# Patient Record
Sex: Male | Born: 1935 | Race: White | Hispanic: No | Smoking: Former smoker
Health system: Southern US, Community
[De-identification: ages and names within clinical notes are randomized; demographics above are authoritative.]

## PROBLEM LIST (undated history)

## (undated) DIAGNOSIS — J449 Chronic obstructive pulmonary disease, unspecified: Secondary | ICD-10-CM

## (undated) DIAGNOSIS — I10 Essential (primary) hypertension: Secondary | ICD-10-CM

## (undated) DIAGNOSIS — H409 Unspecified glaucoma: Secondary | ICD-10-CM

## (undated) DIAGNOSIS — J439 Emphysema, unspecified: Secondary | ICD-10-CM

## (undated) HISTORY — PX: APPENDECTOMY: SHX54

## (undated) HISTORY — DX: Chronic obstructive pulmonary disease, unspecified: J44.9

## (undated) HISTORY — DX: Unspecified glaucoma: H40.9

## (undated) HISTORY — DX: Emphysema, unspecified: J43.9

---

## 2001-02-17 ENCOUNTER — Encounter: Payer: Self-pay | Admitting: Ophthalmology

## 2001-02-17 ENCOUNTER — Ambulatory Visit (HOSPITAL_COMMUNITY): Admission: RE | Admit: 2001-02-17 | Discharge: 2001-02-18 | Payer: Self-pay | Admitting: Ophthalmology

## 2003-10-20 ENCOUNTER — Ambulatory Visit (HOSPITAL_COMMUNITY): Admission: RE | Admit: 2003-10-20 | Discharge: 2003-10-21 | Payer: Self-pay | Admitting: Ophthalmology

## 2007-10-01 ENCOUNTER — Ambulatory Visit: Payer: Self-pay

## 2016-02-27 DIAGNOSIS — H409 Unspecified glaucoma: Secondary | ICD-10-CM | POA: Diagnosis not present

## 2016-03-01 DIAGNOSIS — R2689 Other abnormalities of gait and mobility: Secondary | ICD-10-CM | POA: Diagnosis not present

## 2016-03-07 DIAGNOSIS — R5381 Other malaise: Secondary | ICD-10-CM | POA: Diagnosis not present

## 2016-03-07 DIAGNOSIS — E784 Other hyperlipidemia: Secondary | ICD-10-CM | POA: Diagnosis not present

## 2016-03-07 DIAGNOSIS — I10 Essential (primary) hypertension: Secondary | ICD-10-CM | POA: Diagnosis not present

## 2016-03-10 ENCOUNTER — Encounter: Payer: Self-pay | Admitting: Emergency Medicine

## 2016-03-10 ENCOUNTER — Emergency Department
Admission: EM | Admit: 2016-03-10 | Discharge: 2016-03-10 | Disposition: A | Discharge status: Home / Self Care | Payer: Medicare Other | Attending: Emergency Medicine | Admitting: Emergency Medicine

## 2016-03-10 DIAGNOSIS — R040 Epistaxis: Secondary | ICD-10-CM | POA: Diagnosis not present

## 2016-03-10 DIAGNOSIS — Z87898 Personal history of other specified conditions: Secondary | ICD-10-CM

## 2016-03-10 DIAGNOSIS — I10 Essential (primary) hypertension: Secondary | ICD-10-CM | POA: Insufficient documentation

## 2016-03-10 HISTORY — DX: Essential (primary) hypertension: I10

## 2016-03-10 MED ORDER — PHENYLEPHRINE HCL 0.5 % NA SOLN
2.0000 [drp] | Freq: Four times a day (QID) | NASAL | Status: DC | PRN
  Filled 2016-03-10: qty 15

## 2016-03-10 NOTE — ED Provider Notes (Signed)
[facility] Emergency Department Provider Note ____________________________________________  Time seen: 1439  I have reviewed the triage vital signs and the nursing notes.  HISTORY  Chief Complaint  Epistaxis   HPI Jason Powers is a 82 y.o. male since the ED accompanied by his adult daughter for evaluation of intermittent nosebleeds since Thursday of last week. The patient does admit to having a recent upper respiratory infection as well as some ongoing and some intermittent seasonal allergic rhinitis. His most recent episode of nosebleed occurred at about 2 AM this morning, which awoke him from sleep. He describes an anterior nosebleed coming from the right nare that he was able to stop within about 5 minutes. He was able to go back to sleep and had no problems until about 7:30 AM, when he had another spontaneous bleed from the left nare after blowing his nose. He was able to control it stopped after eating within about 5 minutes. He presents here for evaluation of increased frequency of nosebleeds which appears to be situational at this time. He denies any nausea, vomiting, dizziness, or purulent nasal drainage.  Past Medical History  Diagnosis Date  . Hypertension    There are no active problems to display for this patient.  History reviewed. No pertinent past surgical history.  No current outpatient prescriptions on file.  Allergies Review of patient's allergies indicates no known allergies.  History reviewed. No pertinent family history.  Social History Social History  Substance Use Topics  . Smoking status: Never Smoker   . Smokeless tobacco: None  . Alcohol Use: No   Review of Systems  Constitutional: Negative for fever. Eyes: Negative for visual changes. ENT: Negative for sore throat. Nosebleeds as above. Cardiovascular: Negative for chest pain. Respiratory: Negative for shortness of breath. Gastrointestinal: Negative for abdominal pain,  vomiting and diarrhea. Neurological: Negative for headaches, focal weakness or numbness. ____________________________________________  PHYSICAL EXAM:  VITAL SIGNS: ED Triage Vitals  Enc Vitals Group     BP 03/10/16 1308 131/66 mmHg     Pulse Rate 03/10/16 1308 63     Resp 03/10/16 1308 18     Temp 03/10/16 1308 98.1 F (36.7 C)     Temp Source 03/10/16 1308 Oral     SpO2 03/10/16 1308 96 %     Weight 03/10/16 1308 159 lb (72.122 kg)     Height 03/10/16 1308 5\' 8"  (1.727 m)     Head Cir --      Peak Flow --      Pain Score 03/10/16 1304 0     Pain Loc --      Pain Edu? --      Excl. in [place]? --    Constitutional: Alert and oriented. Well appearing and in no distress. Head: Normocephalic and atraumatic.      Eyes: Conjunctivae are normal. PERRL. Normal extraocular movements      Ears: Canals clear. TMs intact bilaterally.   Nose: No congestion/rhinorrhea. Right anterior nare with local blood scab noted. No active bleeding.    Mouth/Throat: Mucous membranes are moist. Cardiovascular: Normal rate, regular rhythm.  Respiratory: Normal respiratory effort.  Musculoskeletal: Nontender with normal range of motion in all extremities.  Neurologic:  Normal gait without ataxia. Normal speech and language. No gross focal neurologic deficits are appreciated. Skin:  Skin is warm, dry and intact. No rash noted. ____________________________________________  PROCEDURES  Neo-synephrine 0.5 % bottle provided Nose clips provided ____________________________________________  INITIAL IMPRESSION / ASSESSMENT AND PLAN / ED  COURSE  Patient with an acute right anterior nosebleed currently control. Symptoms seemed to be aggravated secondary to seasonal allergies and recent allergic rhinitis. Patient without any history of blood thinners or coagulopathy. Could be discharged with instructions on management of acute nosebleeds. He is also provided with supplies including nose clips, gauze, and  Neo-Synephrine to use for acute nosebleed management. He will follow with primary care provider or follow up with Dr. Richardson Landry as needed for ongoing symptom management. ____________________________________________  FINAL CLINICAL IMPRESSION(S) / ED DIAGNOSES  Final diagnoses:  History of epistaxis  Right-sided epistaxis      Melvenia Needles, PA-C 03/10/16 1541  Lisa Roca, MD 03/10/16 1542

## 2016-03-10 NOTE — Discharge Instructions (Signed)
Nosebleed Nosebleeds are common. They are due to a crack in the inside lining of your nose (mucous membrane) or from a small blood vessel that starts to bleed. Nosebleeds can be caused by many conditions, such as injury, infections, dry mucous membranes or dry climate, medicines, nose picking, and home heating and cooling systems. Most nosebleeds come from blood vessels in the front of your nose. HOME CARE INSTRUCTIONS   Try controlling your nosebleed by pinching your nostrils gently and continuously for at least 10 minutes.  Avoid blowing or sniffing your nose for a number of hours after having a nosebleed.  Do not put gauze inside your nose yourself. If your nose was packed by your health care provider, try to maintain the pack inside of your nose until your health care provider removes it.  If a gauze pack was used and it starts to fall out, gently replace it or cut off the end of it.  If a balloon catheter was used to pack your nose, do not cut or remove it unless your health care provider has instructed you to do that.  Avoid lying down while you are having a nosebleed. Sit up and lean forward.  Use a nasal spray decongestant to help with a nosebleed as directed by your health care provider.  Do not use petroleum jelly or mineral oil in your nose. These can drip into your lungs.  Maintain humidity in your home by using less air conditioning or by using a humidifier.  Aspirinand blood thinners make bleeding more likely. If you are prescribed these medicines and you suffer from nosebleeds, ask your health care provider if you should stop taking the medicines or adjust the dose. Do not stop medicines unless directed by your health care provider  Resume your normal activities as you are able, but avoid straining, lifting, or bending at the waist for several days.  If your nosebleed was caused by dry mucous membranes, use over-the-counter saline nasal spray or gel. This will keep the  mucous membranes moist and allow them to heal. If you must use a lubricant, choose the water-soluble variety. Use it only sparingly, and do not use it within several hours of lying down.  Keep all follow-up visits as directed by your health care provider. This is important. SEEK MEDICAL CARE IF:  You have a fever.  You get frequent nosebleeds.  You are getting nosebleeds more often. SEEK IMMEDIATE MEDICAL CARE IF:  Your nosebleed lasts longer than 20 minutes.  Your nosebleed occurs after an injury to your face, and your nose looks crooked or broken.  You have unusual bleeding from other parts of your body.  You have unusual bruising on other parts of your body.  You feel light-headed or you faint.  You become sweaty.  You vomit blood.  Your nosebleed occurs after a head injury.   This information is not intended to replace advice given to you by your health care provider. Make sure you discuss any questions you have with your health care provider.   Document Released: 08/08/2005 Document Revised: 11/19/2014 Document Reviewed: 06/14/2014 Elsevier Interactive Patient Education 2016 [place] nose bleeds as discussed. Pack and pinch the nose and report to the ED for any nose bleeds not controlled within 45-60 minutes. See Dr. Richardson Landry or Ear, Nose, and Throat as needed.

## 2016-03-10 NOTE — ED Notes (Signed)
Pt to ed with c/o nosebleed intermittently since Thursday.  No bleeding noted at this time.

## 2016-03-12 DIAGNOSIS — R0609 Other forms of dyspnea: Secondary | ICD-10-CM | POA: Diagnosis not present

## 2016-03-12 DIAGNOSIS — R270 Ataxia, unspecified: Secondary | ICD-10-CM | POA: Diagnosis not present

## 2016-03-12 DIAGNOSIS — R040 Epistaxis: Secondary | ICD-10-CM | POA: Diagnosis not present

## 2016-08-03 DIAGNOSIS — Z23 Encounter for immunization: Secondary | ICD-10-CM | POA: Diagnosis not present

## 2016-12-27 DIAGNOSIS — H401130 Primary open-angle glaucoma, bilateral, stage unspecified: Secondary | ICD-10-CM | POA: Diagnosis not present

## 2017-04-15 DIAGNOSIS — Z1389 Encounter for screening for other disorder: Secondary | ICD-10-CM | POA: Diagnosis not present

## 2017-04-15 DIAGNOSIS — R0609 Other forms of dyspnea: Secondary | ICD-10-CM | POA: Diagnosis not present

## 2017-04-16 DIAGNOSIS — E784 Other hyperlipidemia: Secondary | ICD-10-CM | POA: Diagnosis not present

## 2017-04-16 DIAGNOSIS — I10 Essential (primary) hypertension: Secondary | ICD-10-CM | POA: Diagnosis not present

## 2017-04-16 DIAGNOSIS — R5381 Other malaise: Secondary | ICD-10-CM | POA: Diagnosis not present

## 2017-04-26 DIAGNOSIS — H401190 Primary open-angle glaucoma, unspecified eye, stage unspecified: Secondary | ICD-10-CM | POA: Diagnosis not present

## 2017-04-29 DIAGNOSIS — R0609 Other forms of dyspnea: Secondary | ICD-10-CM | POA: Diagnosis not present

## 2017-04-29 DIAGNOSIS — R040 Epistaxis: Secondary | ICD-10-CM | POA: Diagnosis not present

## 2017-08-02 DIAGNOSIS — Z23 Encounter for immunization: Secondary | ICD-10-CM | POA: Diagnosis not present

## 2017-10-28 DIAGNOSIS — R040 Epistaxis: Secondary | ICD-10-CM | POA: Diagnosis not present

## 2017-10-28 DIAGNOSIS — Z720 Tobacco use: Secondary | ICD-10-CM | POA: Diagnosis not present

## 2017-10-28 DIAGNOSIS — R0609 Other forms of dyspnea: Secondary | ICD-10-CM | POA: Diagnosis not present

## 2017-11-14 ENCOUNTER — Ambulatory Visit (INDEPENDENT_AMBULATORY_CARE_PROVIDER_SITE_OTHER): Payer: Medicare Other | Admitting: Podiatry

## 2017-11-14 ENCOUNTER — Encounter: Payer: Self-pay | Admitting: Podiatry

## 2017-11-14 DIAGNOSIS — B351 Tinea unguium: Secondary | ICD-10-CM | POA: Diagnosis not present

## 2017-11-14 DIAGNOSIS — M79674 Pain in right toe(s): Secondary | ICD-10-CM

## 2017-11-14 DIAGNOSIS — T148XXA Other injury of unspecified body region, initial encounter: Secondary | ICD-10-CM

## 2017-11-14 NOTE — Progress Notes (Signed)
   Subjective:    Patient ID: Jason Powers, male    DOB: February 26, 1921, 84 y.o.   MRN: [id number]  HPIthis patient presents the office with chief complaint of an injury to his big toenail on his right foot.  He says last night he stubbed his toenail and caused bleeding to occur under [name].  He says the injury was initially painful but he is not having much discomfort now.  He says [name] is very, very long and he is unable to self treat the Powers.  He says he had surgery for the removal of the great toenail on the left big toe.  He presents the office today for an evaluation and treatment of this painful Powers.    Review of Systems  Musculoskeletal: Positive for gait problem and myalgias.  Hematological: Bruises/bleeds easily.       Objective:   Physical Exam General Appearance  Alert, conversant and in no acute stress.  Vascular  Dorsalis pedis and posterior pulses are  Not  palpable  bilaterally.  Capillary return is within normal limits  bilaterally. Temperature is diminished  Bilaterally.  Neurologic  Senn-Weinstein monofilament wire test within normal limits  bilaterally. Muscle power within normal limits bilaterally.  Nails Thick disfigured discolored nails with subungual debris  hallux right foot.   The Powers plate is unattached from the nailbed and there is hematoma noted at the  proximal Powers fold.   Orthopedic  No limitations of motion of motion feet bilaterally.  No crepitus or effusions noted.  No bony pathology or digital deformities noted.  Skin  normotropic skin with no porokeratosis noted bilaterally.  No signs of infections or ulcers noted.          Assessment & Plan:  Subungual hematoma right hallux  Onychomycosis  Right hallux.  IE  Excision of Powers plate right hallux.  Neosporin/DSD.  Home soaking instructions given.  If this condition worsens or becomes very painful, the patient was told to contact this office or go to the Emergency Department at the hospital.  RTC prn  Gardiner Barefoot DPM

## 2018-01-08 DIAGNOSIS — R0609 Other forms of dyspnea: Secondary | ICD-10-CM | POA: Diagnosis not present

## 2018-01-08 DIAGNOSIS — R040 Epistaxis: Secondary | ICD-10-CM | POA: Diagnosis not present

## 2018-01-08 DIAGNOSIS — Z720 Tobacco use: Secondary | ICD-10-CM | POA: Diagnosis not present

## 2018-03-25 DIAGNOSIS — H353112 Nonexudative age-related macular degeneration, right eye, intermediate dry stage: Secondary | ICD-10-CM | POA: Diagnosis not present

## 2018-04-14 DIAGNOSIS — Z720 Tobacco use: Secondary | ICD-10-CM | POA: Diagnosis not present

## 2018-04-14 DIAGNOSIS — R040 Epistaxis: Secondary | ICD-10-CM | POA: Diagnosis not present

## 2018-04-14 DIAGNOSIS — R0609 Other forms of dyspnea: Secondary | ICD-10-CM | POA: Diagnosis not present

## 2018-07-29 DIAGNOSIS — C4491 Basal cell carcinoma of skin, unspecified: Secondary | ICD-10-CM | POA: Diagnosis not present

## 2018-07-29 DIAGNOSIS — R040 Epistaxis: Secondary | ICD-10-CM | POA: Diagnosis not present

## 2018-07-29 DIAGNOSIS — C4441 Basal cell carcinoma of skin of scalp and neck: Secondary | ICD-10-CM | POA: Diagnosis not present

## 2018-07-29 DIAGNOSIS — R0609 Other forms of dyspnea: Secondary | ICD-10-CM | POA: Diagnosis not present

## 2018-09-03 DIAGNOSIS — L820 Inflamed seborrheic keratosis: Secondary | ICD-10-CM | POA: Diagnosis not present

## 2018-09-03 DIAGNOSIS — L720 Epidermal cyst: Secondary | ICD-10-CM | POA: Diagnosis not present

## 2018-09-03 DIAGNOSIS — D1800 Hemangioma unspecified site: Secondary | ICD-10-CM | POA: Diagnosis not present

## 2018-09-03 DIAGNOSIS — L570 Actinic keratosis: Secondary | ICD-10-CM | POA: Diagnosis not present

## 2018-09-25 DIAGNOSIS — L720 Epidermal cyst: Secondary | ICD-10-CM | POA: Diagnosis not present

## 2018-09-25 DIAGNOSIS — L570 Actinic keratosis: Secondary | ICD-10-CM | POA: Diagnosis not present

## 2018-09-25 DIAGNOSIS — L820 Inflamed seborrheic keratosis: Secondary | ICD-10-CM | POA: Diagnosis not present

## 2018-10-01 DIAGNOSIS — H401132 Primary open-angle glaucoma, bilateral, moderate stage: Secondary | ICD-10-CM | POA: Diagnosis not present

## 2018-11-21 DIAGNOSIS — L821 Other seborrheic keratosis: Secondary | ICD-10-CM | POA: Diagnosis not present

## 2018-11-21 DIAGNOSIS — L700 Acne vulgaris: Secondary | ICD-10-CM | POA: Diagnosis not present

## 2018-11-21 DIAGNOSIS — E854 Organ-limited amyloidosis: Secondary | ICD-10-CM | POA: Diagnosis not present

## 2018-11-21 DIAGNOSIS — L570 Actinic keratosis: Secondary | ICD-10-CM | POA: Diagnosis not present

## 2018-11-21 DIAGNOSIS — D485 Neoplasm of uncertain behavior of skin: Secondary | ICD-10-CM | POA: Diagnosis not present

## 2019-04-21 DIAGNOSIS — H401132 Primary open-angle glaucoma, bilateral, moderate stage: Secondary | ICD-10-CM | POA: Diagnosis not present

## 2019-08-17 ENCOUNTER — Other Ambulatory Visit: Payer: Self-pay

## 2019-08-17 DIAGNOSIS — Z20822 Contact with and (suspected) exposure to covid-19: Secondary | ICD-10-CM

## 2019-08-18 LAB — NOVEL CORONAVIRUS, NAA: SARS-CoV-2, NAA: NOT DETECTED

## 2019-08-21 ENCOUNTER — Telehealth: Payer: Self-pay | Admitting: General Practice

## 2019-08-21 NOTE — Telephone Encounter (Signed)
Patient is calling to receive his negative COVID test results. Patient expressed understanding.

## 2019-11-17 DIAGNOSIS — M1990 Unspecified osteoarthritis, unspecified site: Secondary | ICD-10-CM | POA: Diagnosis not present

## 2019-11-17 DIAGNOSIS — Z720 Tobacco use: Secondary | ICD-10-CM | POA: Diagnosis not present

## 2019-11-17 DIAGNOSIS — J019 Acute sinusitis, unspecified: Secondary | ICD-10-CM | POA: Diagnosis not present

## 2019-12-01 ENCOUNTER — Ambulatory Visit: Payer: Medicare Other

## 2020-02-01 ENCOUNTER — Telehealth: Payer: Self-pay

## 2020-02-01 NOTE — Telephone Encounter (Signed)
Pt's daughter [name] called in and said "[facility]" knows Dr. Derrel Nip personally and would like to know if she would accept him as a new patient. I let her know she is not accepting new patients at this time but she wanted me to still ask. She would like a call back @ [phone].

## 2020-02-01 NOTE — Telephone Encounter (Signed)
Will you contact to schedule a new pt appt.

## 2020-02-01 NOTE — Telephone Encounter (Signed)
Yes, I will accept him 

## 2020-02-26 ENCOUNTER — Ambulatory Visit (INDEPENDENT_AMBULATORY_CARE_PROVIDER_SITE_OTHER): Payer: Medicare Other | Admitting: Internal Medicine

## 2020-02-26 ENCOUNTER — Encounter: Payer: Self-pay | Admitting: Internal Medicine

## 2020-02-26 ENCOUNTER — Other Ambulatory Visit: Payer: Self-pay

## 2020-02-26 VITALS — BP 138/66 | HR 67 | Temp 97.9°F | Resp 16 | Ht 68.0 in | Wt 146.9 lb

## 2020-02-26 DIAGNOSIS — R600 Localized edema: Secondary | ICD-10-CM | POA: Diagnosis not present

## 2020-02-26 DIAGNOSIS — I10 Essential (primary) hypertension: Secondary | ICD-10-CM

## 2020-02-26 DIAGNOSIS — D649 Anemia, unspecified: Secondary | ICD-10-CM

## 2020-02-26 DIAGNOSIS — R5383 Other fatigue: Secondary | ICD-10-CM

## 2020-02-26 NOTE — Progress Notes (Signed)
Subjective:  Patient ID: Jason Powers, male    DOB: June 27, 1921  Age: 86 y.o. MRN: [id number]  CC: The primary encounter diagnosis was Essential hypertension. Diagnoses of Fatigue, unspecified type and Fluid retention in legs were also pertinent to this visit.  HPI Jason Powers presents for establishment of care .  He is  A healthy 86 yr old male with no significant PMH was is transferring care from Dr Lavera Guise.  He takes HCTZ for management of ankle edema. He lives independently but his daughter accompanies him today and assists with all decisions.  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 He drives only through the neighborhood.   He reports recent onset of fatigue with activities.  Sleeps well. Denies shortness of breath or cough.  Was told he had emphysema by former PCP but has no symptoms and has not smoked in over 50 years.    This visit occurred during the SARS-CoV-2 public health emergency.  Safety protocols were in place, including screening questions prior to the visit, additional usage of staff PPE, and extensive cleaning of exam room while observing appropriate contact time as indicated for disinfecting solutions.    Patient has received both doses of the available COVID 19 vaccine without complications.  Patient continues to mask when outside of the home except when walking in yard or at safe distances from others .  Patient denies any change in mood or development of unhealthy behaviors resuting from the pandemic's restriction of activities and socialization.    History Jason Powers has a past medical history of Emphysema of lung ([place]), Glaucoma, and Hypertension.   He has no past surgical history on file.   His family history includes Cancer in his brother; Drug abuse in his brother.He reports that he quit smoking about 57 years ago. He has never used smokeless tobacco. He reports that he does not drink alcohol or use drugs.  Outpatient Medications Prior to Visit    Medication Sig Dispense Refill  . aspirin EC 81 MG tablet Take 81 mg by mouth daily.    . brinzolamide (AZOPT) 1 % ophthalmic suspension 1 drop 2 (two) times daily.    Marland Kitchen HYDROCHLOROTHIAZIDE PO Take 5 mg by mouth daily.    . Travoprost, BAK Free, (TRAVATAN) 0.004 % SOLN ophthalmic solution 1 drop at bedtime.     No facility-administered medications prior to visit.    Review of Systems:  Patient denies headache, fevers, malaise, unintentional weight loss, skin rash, eye pain, sinus congestion and sinus pain, sore throat, dysphagia,  hemoptysis , cough, dyspnea, wheezing, chest pain, palpitations, orthopnea, edema, abdominal pain, nausea, melena, diarrhea, constipation, flank pain, dysuria, hematuria, urinary  Frequency, nocturia, numbness, tingling, seizures,  Focal weakness, Loss of consciousness,  Tremor, insomnia, depression, anxiety, and suicidal ideation.     Objective:  BP 138/66 (BP Location: Left Arm, Patient Position: Sitting, Cuff Size: Normal)   Pulse 67   Temp 97.9 F (36.6 C) (Temporal)   Resp 16   Ht 5\' 8"  (1.727 m)   Wt 146 lb 14.4 oz (66.6 kg)   SpO2 96%   BMI 22.34 kg/m   Physical Exam:  General appearance: alert, cooperative and appears stated age Ears: normal TM's and external ear canals both ears Throat: lips, mucosa, and tongue normal; teeth and gums normal Neck: no adenopathy, no carotid bruit, supple, symmetrical, trachea midline and thyroid not enlarged, symmetric, no tenderness/mass/nodules Back: symmetric, no curvature. ROM normal. No CVA tenderness. Lungs: clear to  auscultation bilaterally Heart: regular rate and rhythm, S1, S2 normal, no murmur, click, rub or gallop Abdomen: soft, non-tender; bowel sounds normal; no masses,  no organomegaly Pulses: 2+ and symmetric Skin: Skin color, texture, turgor normal. No rashes or lesions Lymph nodes: Cervical, supraclavicular, and axillary nodes normal.   Assessment & Plan:   Problem List Items Addressed  This Visit      Unprioritized   Fluid retention in legs    Chronic , managed with hctz.  Lab Results  Component Value Date   NA 141 02/26/2020   K 3.9 02/26/2020   CL 102 02/26/2020   CO2 23 02/26/2020   Lab Results  Component Value Date   CREATININE 1.12 (H) 02/26/2020         Fatigue    Mild, secondary to anemia.  Will check b12, folate and iron   Lab Results  Component Value Date   WBC 6.3 02/26/2020   HGB 10.1 (L) 02/26/2020   HCT 32.7 (L) 02/26/2020   MCV 94.8 02/26/2020   PLT 258 02/26/2020   No results found for: VITAMINB12       Relevant Orders   TSH (Completed)   CBC with Differential/Platelet (Completed)    Other Visit Diagnoses    Essential hypertension    -  Primary   Relevant Orders   Comprehensive metabolic panel (Completed)      I provided  30 minutes of  face-to-face time during this encounter reviewing patient's current problems and past surgeries, labs and imaging studies, providing counseling on the above mentioned problems , and coordination  of care .  I am having Jason Powers maintain his aspirin EC, HYDROCHLOROTHIAZIDE PO, brinzolamide, and Travoprost (BAK Free).  No orders of the defined types were placed in this encounter.   There are no discontinued medications.  Follow-up: No follow-ups on file.   Crecencio Mc, MD

## 2020-02-27 DIAGNOSIS — R5383 Other fatigue: Secondary | ICD-10-CM | POA: Insufficient documentation

## 2020-02-27 DIAGNOSIS — R600 Localized edema: Secondary | ICD-10-CM | POA: Insufficient documentation

## 2020-02-27 LAB — COMPREHENSIVE METABOLIC PANEL
AG Ratio: 1.4 (calc) (ref 1.0–2.5)
ALT: 11 U/L (ref 9–46)
AST: 18 U/L (ref 10–35)
Albumin: 3.9 g/dL (ref 3.6–5.1)
Alkaline phosphatase (APISO): 49 U/L (ref 35–144)
BUN/Creatinine Ratio: 25 (calc) — ABNORMAL HIGH (ref 6–22)
BUN: 28 mg/dL — ABNORMAL HIGH (ref 7–25)
CO2: 23 mmol/L (ref 20–32)
Calcium: 8.9 mg/dL (ref 8.6–10.3)
Chloride: 102 mmol/L (ref 98–110)
Creat: 1.12 mg/dL — ABNORMAL HIGH (ref 0.70–1.11)
Globulin: 2.8 g/dL (calc) (ref 1.9–3.7)
Glucose, Bld: 109 mg/dL — ABNORMAL HIGH (ref 65–99)
Potassium: 3.9 mmol/L (ref 3.5–5.3)
Sodium: 141 mmol/L (ref 135–146)
Total Bilirubin: 0.4 mg/dL (ref 0.2–1.2)
Total Protein: 6.7 g/dL (ref 6.1–8.1)

## 2020-02-27 LAB — CBC WITH DIFFERENTIAL/PLATELET
Absolute Monocytes: 725 cells/uL (ref 200–950)
Basophils Absolute: 69 cells/uL (ref 0–200)
Basophils Relative: 1.1 %
Eosinophils Absolute: 202 cells/uL (ref 15–500)
Eosinophils Relative: 3.2 %
HCT: 32.7 % — ABNORMAL LOW (ref 38.5–50.0)
Hemoglobin: 10.1 g/dL — ABNORMAL LOW (ref 13.2–17.1)
Lymphs Abs: 1071 cells/uL (ref 850–3900)
MCH: 29.3 pg (ref 27.0–33.0)
MCHC: 30.9 g/dL — ABNORMAL LOW (ref 32.0–36.0)
MCV: 94.8 fL (ref 80.0–100.0)
MPV: 11.3 fL (ref 7.5–12.5)
Monocytes Relative: 11.5 %
Neutro Abs: 4234 cells/uL (ref 1500–7800)
Neutrophils Relative %: 67.2 %
Platelets: 258 10*3/uL (ref 140–400)
RBC: 3.45 10*6/uL — ABNORMAL LOW (ref 4.20–5.80)
RDW: 15 % (ref 11.0–15.0)
Total Lymphocyte: 17 %
WBC: 6.3 10*3/uL (ref 3.8–10.8)

## 2020-02-27 LAB — TSH: TSH: 2.39 mIU/L (ref 0.40–4.50)

## 2020-02-27 NOTE — Assessment & Plan Note (Signed)
Chronic , managed with hctz.  Lab Results  Component Value Date   NA 141 02/26/2020   K 3.9 02/26/2020   CL 102 02/26/2020   CO2 23 02/26/2020   Lab Results  Component Value Date   CREATININE 1.12 (H) 02/26/2020

## 2020-02-27 NOTE — Assessment & Plan Note (Signed)
Mild, secondary to anemia.  Will check b12, folate and iron   Lab Results  Component Value Date   WBC 6.3 02/26/2020   HGB 10.1 (L) 02/26/2020   HCT 32.7 (L) 02/26/2020   MCV 94.8 02/26/2020   PLT 258 02/26/2020   No results found for: [id number]

## 2020-02-28 NOTE — Addendum Note (Signed)
Addended by: Crecencio Mc on: 02/28/2020 05:14 PM   Modules accepted: Orders

## 2020-03-01 ENCOUNTER — Other Ambulatory Visit: Payer: Self-pay

## 2020-03-01 ENCOUNTER — Other Ambulatory Visit (INDEPENDENT_AMBULATORY_CARE_PROVIDER_SITE_OTHER): Payer: Medicare Other

## 2020-03-01 DIAGNOSIS — D649 Anemia, unspecified: Secondary | ICD-10-CM

## 2020-03-02 ENCOUNTER — Other Ambulatory Visit: Payer: Self-pay | Admitting: Internal Medicine

## 2020-03-02 ENCOUNTER — Encounter: Payer: Self-pay | Admitting: Internal Medicine

## 2020-03-02 DIAGNOSIS — D509 Iron deficiency anemia, unspecified: Secondary | ICD-10-CM | POA: Insufficient documentation

## 2020-03-02 LAB — B12 AND FOLATE PANEL
Folate: 17.4 ng/mL (ref >5.9)
Vitamin B-12: 560 pg/mL (ref 211–911)

## 2020-03-02 LAB — IRON,TIBC AND FERRITIN PANEL
%SAT: 5 % (calc) — ABNORMAL LOW (ref 20–48)
Ferritin: 14 ng/mL — ABNORMAL LOW (ref 24–380)
Iron: 20 ug/dL — ABNORMAL LOW (ref 50–180)
TIBC: 396 mcg/dL (calc) (ref 250–425)

## 2020-03-02 MED ORDER — FERROUS FUM-IRON POLYSACCH 162-115.2 MG PO CAPS: 1.0000 | ORAL_CAPSULE | Freq: Every day | ORAL | 2 refills | Status: DC

## 2020-03-02 NOTE — Progress Notes (Signed)
Jason Powers's additional labs indicate that his  iron stores are  low and this is the cause for the anemia.   If you can tolerate a daily iron supplement with food or orange juice to prevent  nausea  I would recommend taking one for 6 weeks and repeating your labs at that time . Your other labs are normal.   You may develop constipation with iron supplements;  try using a stool softener (Colace) at night.   Regards,  Dr. Derrel Nip

## 2020-03-08 ENCOUNTER — Other Ambulatory Visit (INDEPENDENT_AMBULATORY_CARE_PROVIDER_SITE_OTHER): Payer: Medicare Other

## 2020-03-08 ENCOUNTER — Other Ambulatory Visit: Payer: Self-pay | Admitting: Internal Medicine

## 2020-03-08 DIAGNOSIS — D649 Anemia, unspecified: Secondary | ICD-10-CM | POA: Diagnosis not present

## 2020-03-08 DIAGNOSIS — D508 Other iron deficiency anemias: Secondary | ICD-10-CM

## 2020-03-08 LAB — FECAL OCCULT BLOOD, IMMUNOCHEMICAL: Fecal Occult Bld: NEGATIVE

## 2020-04-21 ENCOUNTER — Other Ambulatory Visit: Payer: Self-pay

## 2020-04-21 ENCOUNTER — Other Ambulatory Visit (INDEPENDENT_AMBULATORY_CARE_PROVIDER_SITE_OTHER): Payer: Medicare Other

## 2020-04-21 DIAGNOSIS — D508 Other iron deficiency anemias: Secondary | ICD-10-CM

## 2020-04-21 LAB — CBC WITH DIFFERENTIAL/PLATELET
Basophils Absolute: 0.2 10*3/uL — ABNORMAL HIGH (ref 0.0–0.1)
Basophils Relative: 3.1 % — ABNORMAL HIGH (ref 0.0–3.0)
Eosinophils Absolute: 0.3 10*3/uL (ref 0.0–0.7)
Eosinophils Relative: 5.4 % — ABNORMAL HIGH (ref 0.0–5.0)
HCT: 35.8 % — ABNORMAL LOW (ref 39.0–52.0)
Hemoglobin: 11.9 g/dL — ABNORMAL LOW (ref 13.0–17.0)
Lymphocytes Relative: 14.9 % (ref 12.0–46.0)
Lymphs Abs: 0.8 10*3/uL (ref 0.7–4.0)
MCHC: 33.3 g/dL (ref 30.0–36.0)
MCV: 94.7 fl (ref 78.0–100.0)
Monocytes Absolute: 0.7 10*3/uL (ref 0.1–1.0)
Monocytes Relative: 13.1 % — ABNORMAL HIGH (ref 3.0–12.0)
Neutro Abs: 3.4 10*3/uL (ref 1.4–7.7)
Neutrophils Relative %: 63.5 % (ref 43.0–77.0)
Platelets: 235 10*3/uL (ref 150.0–400.0)
RBC: 3.78 Mil/uL — ABNORMAL LOW (ref 4.22–5.81)
RDW: 22.8 % — ABNORMAL HIGH (ref 11.5–15.5)
WBC: 5.4 10*3/uL (ref 4.0–10.5)

## 2020-04-21 LAB — IRON,TIBC AND FERRITIN PANEL
%SAT: 32 % (ref 20–48)
Ferritin: 83 ng/mL (ref 24–380)
Iron: 93 ug/dL (ref 50–180)
TIBC: 290 ug/dL (ref 250–425)

## 2020-05-02 DIAGNOSIS — H401132 Primary open-angle glaucoma, bilateral, moderate stage: Secondary | ICD-10-CM | POA: Diagnosis not present

## 2020-05-23 ENCOUNTER — Ambulatory Visit (INDEPENDENT_AMBULATORY_CARE_PROVIDER_SITE_OTHER): Payer: Medicare Other

## 2020-05-23 VITALS — Ht 68.0 in | Wt 146.0 lb

## 2020-05-23 DIAGNOSIS — Z0000 Encounter for general adult medical examination without abnormal findings: Secondary | ICD-10-CM

## 2020-05-23 NOTE — Progress Notes (Addendum)
Subjective:   Jason Powers is a 86 y.o. male who presents for an Initial Medicare Annual Wellness Visit.  Review of Systems    No ROS.  Medicare Wellness Virtual Visit.   Cardiac Risk Factors include: male gender;advanced age (>3men, >45 women)     Objective:    Today's Vitals   05/23/20 0838  Weight: 146 lb (66.2 kg)  Height: 5\' 8"  (1.727 m)   Body mass index is 22.2 kg/m.  Advanced Directives 05/23/2020  Does Patient Have a Medical Advance Directive? No  Would patient like information on creating a medical advance directive? Yes (MAU/Ambulatory/Procedural Areas - Information given)    Current Medications (verified) Outpatient Encounter Medications as of 05/23/2020  Medication Sig  . aspirin EC 81 MG tablet Take 81 mg by mouth daily.  . brinzolamide (AZOPT) 1 % ophthalmic suspension 1 drop 2 (two) times daily.  . ferrous fumarate-iron polysaccharide complex (TANDEM) 162-115.2 MG CAPS capsule Take 1 capsule by mouth daily with breakfast.  . HYDROCHLOROTHIAZIDE PO Take 5 mg by mouth daily.  . Travoprost, BAK Free, (TRAVATAN) 0.004 % SOLN ophthalmic solution 1 drop at bedtime.   No facility-administered encounter medications on file as of 05/23/2020.    Allergies (verified) Patient has no known allergies.   History: Past Medical History:  Diagnosis Date  . Emphysema of lung ([place])   . Glaucoma   . Hypertension    History reviewed. No pertinent surgical history. Family History  Problem Relation Age of Onset  . Drug abuse Brother   . Cancer Brother    Social History   Socioeconomic History  . Marital status: Married    Spouse name: Not on file  . Number of children: Not on file  . Years of education: Not on file  . Highest education level: Not on file  Occupational History  . Not on file  Tobacco Use  . Smoking status: Former Smoker    Quit date: 1964    Years since quitting: 57.5  . Smokeless tobacco: Never Used  Vaping Use  . Vaping Use: Never  used  Substance and Sexual Activity  . Alcohol use: No  . Drug use: No  . Sexual activity: Not Currently  Other Topics Concern  . Not on file  Social History Narrative  . Not on file   Social Determinants of Health   Financial Resource Strain: Low Risk   . Difficulty of Paying Living Expenses: Not hard at all  Food Insecurity: No Food Insecurity  . Worried About Charity fundraiser in the Last Year: Never true  . Ran Out of Food in the Last Year: Never true  Transportation Needs: No Transportation Needs  . Lack of Transportation (Medical): No  . Lack of Transportation (Non-Medical): No  Physical Activity:   . Days of Exercise per Week:   . Minutes of Exercise per Session:   Stress: No Stress Concern Present  . Feeling of Stress : Not at all  Social Connections: Unknown  . Frequency of Communication with Friends and Family: More than three times a week  . Frequency of Social Gatherings with Friends and Family: More than three times a week  . Attends Religious Services: Not on file  . Active Member of Clubs or Organizations: Not on file  . Attends Archivist Meetings: Not on file  . Marital Status: Not on file    Tobacco Counseling Counseling given: Not Answered   Clinical Intake:  Pre-visit preparation completed: Yes  Diabetes: No  How often do you need to have someone help you when you read instructions, pamphlets, or other written materials from your doctor or pharmacy?: 1 - Never  Interpreter Needed?: No      Activities of Daily Living In your present state of health, do you have any difficulty performing the following activities: 05/23/2020  Hearing? N  Vision? N  Difficulty concentrating or making decisions? N  Walking or climbing stairs? Y  Dressing or bathing? N  Doing errands, shopping? N  Preparing Food and eating ? N  Using the Toilet? N  In the past six months, have you accidently leaked urine? N  Do you have problems with  loss of bowel control? N  Managing your Medications? N  Managing your Finances? N  Housekeeping or managing your Housekeeping? N  Some recent data might be hidden    Patient Care Team: Crecencio Mc, MD as PCP - General (Internal Medicine)  Indicate any recent Medical Services you may have received from other than [facility] providers in the past year (date may be approximate).     Assessment:   This is a routine wellness examination for Jason Powers.  I connected with Jason Powers today by telephone and verified that I am speaking with the correct person using two identifiers. Location patient: home Location provider: work Persons participating in the virtual visit: patient, Marine scientist.  Daughter, [name] assist with information also.    I discussed the limitations, risks, security and privacy concerns of performing an evaluation and management service by telephone and the availability of in person appointments. The patient expressed understanding and verbally consented to this telephonic visit.    Interactive audio and video telecommunications were attempted between this provider and patient, however failed, due to patient having technical difficulties OR patient did not have access to video capability.  We continued and completed visit with audio only.  Some vital signs may be absent or patient reported.   Hearing/Vision screen  Hearing Screening   125Hz  250Hz  500Hz  1000Hz  2000Hz  3000Hz  4000Hz  6000Hz  8000Hz   Right ear:           Left ear:           Comments: Patient is able to hear conversational tones without difficulty.  No issues reported.   Vision Screening Comments: Followed by [facility], Dr. Glennon Mac Wears corrective lenses Cataract extraction, bilateral Glaucoma; drops in use Visual acuity not assessed, virtual visit.  They have seen their ophthalmologist in the last 12 months.    Dietary issues and exercise activities discussed: Regular diet Good water intake Minimal caffeine    Microwave in use when heating meals, most meals received with dine out. Prepares salad for self.   Current Exercise Habits: Home exercise routine, Type of exercise: walking;stretching, Intensity: Mild  Goals      Patient Stated   .  Maintain Healthy Lifestyle (pt-stated)      Stay active Stay hydrated Healthy diet      Depression Screen PHQ 2/9 Scores 05/23/2020 02/26/2020  PHQ - 2 Score 0 0    Fall Risk Fall Risk  05/23/2020 02/26/2020  Falls in the past year? 0 0  Number falls in past yr: 0 -  Follow up Falls evaluation completed Falls evaluation completed   Handrails in use when climbing stairs? Yes  Home free of loose throw rugs in walkways, pet beds, electrical cords, etc? Yes  Adequate lighting in your home to reduce risk of falls? Yes   ASSISTIVE DEVICES  UTILIZED TO PREVENT FALLS: Life alert? No  Use of a cane, walker or w/c? Yes , cane Grab bars in the bathroom? Yes  Shower chair or bench in shower? Yes  Elevated toilet seat or a handicapped toilet? No   TIMED UP AND GO: Was the test performed? No . Virtual visit.  Cognitive Function:  Patient is alert and oriented x3.  Denies difficulty with memory, focusing, and concentrating.     6CIT Screen 05/23/2020  What Year? 0 points   Immunizations Immunization History  Administered Date(s) Administered  . Influenza, High Dose Seasonal PF 07/28/2019  . PFIZER SARS-COV-2 Vaccination 12/26/2019, 01/23/2020   Tdap vaccine- deferred  Pneumococcal vaccine- plans to receive at next office visit.  Health Maintenance Health Maintenance  Topic Date Due  . PNA vac Low Risk Adult (1 of 2 - PCV13) Never done  . TETANUS/TDAP  05/23/2021 (Originally 04/19/1940)  . INFLUENZA VACCINE  06/12/2020  . COVID-19 Vaccine  Completed   Dental Screening: Recommended annual dental exams for proper oral hygiene  Community Resource Referral / Chronic Care Management: CRR required this visit?  No   CCM required this visit?  No      Plan:   Keep all routine maintenance appointments.   Follow up 08/29/20 @ 10:30  I have personally reviewed and noted the following in the patient's chart:   . Medical and social history . Use of alcohol, tobacco or illicit drugs  . Current medications and supplements . Functional ability and status . Nutritional status . Physical activity . Advanced directives . List of other physicians . Hospitalizations, surgeries, and ER visits in previous 12 months . Vitals . Screenings to include cognitive, depression, and falls . Referrals and appointments  In addition, I have reviewed and discussed with patient certain preventive protocols, quality metrics, and best practice recommendations. A written personalized care plan for preventive services as well as general preventive health recommendations were provided to patient via mail.     OBrien-Blaney, Sufyan Meidinger L, LPN   [id number]    I have reviewed the above information and agree with above.   Deborra Medina, MD

## 2020-05-23 NOTE — Patient Instructions (Addendum)
Jason Powers , Thank you for taking time to come for your Medicare Wellness Visit. I appreciate your ongoing commitment to your health goals. Please review the following plan we discussed and let me know if I can assist you in the future.   These are the goals we discussed: Goals      Patient Stated   .  Maintain Healthy Lifestyle (pt-stated)      Stay active Stay hydrated Healthy diet       This is a list of the screening recommended for you and due dates:  Health Maintenance  Topic Date Due  . Pneumonia vaccines (1 of 2 - PCV13) Never done  . Tetanus Vaccine  05/23/2021*  . Flu Shot  06/12/2020  . COVID-19 Vaccine  Completed  *Topic was postponed. The date shown is not the original due date.    Immunizations Immunization History  Administered Date(s) Administered  . Influenza, High Dose Seasonal PF 07/28/2019  . PFIZER SARS-COV-2 Vaccination 12/26/2019, 01/23/2020   Keep all routine maintenance appointments.   Follow up 08/29/20 @ 10:30  Advanced directives: given to patient via pick up front office.   Handicap form provided via pick up front office.   Conditions/risks identified: none new.  Follow up in one year for your annual wellness visit.   Preventive Care 33 Years and Older, Male Preventive care refers to lifestyle choices and visits with your health care provider that can promote health and wellness. What does preventive care include?  A yearly physical exam. This is also called an annual well check.  Dental exams once or twice a year.  Routine eye exams. Ask your health care provider how often you should have your eyes checked.  Personal lifestyle choices, including:  Daily care of your teeth and gums.  Regular physical activity.  Eating a healthy diet.  Avoiding tobacco and drug use.  Limiting alcohol use.  Practicing safe sex.  Taking low doses of aspirin every day.  Taking vitamin and mineral supplements as recommended by your health  care provider. What happens during an annual well check? The services and screenings done by your health care provider during your annual well check will depend on your age, overall health, lifestyle risk factors, and family history of disease. Counseling  Your health care provider may ask you questions about your:  Alcohol use.  Tobacco use.  Drug use.  Emotional well-being.  Home and relationship well-being.  Sexual activity.  Eating habits.  History of falls.  Memory and ability to understand (cognition).  Work and work Statistician. Screening  You may have the following tests or measurements:  Height, weight, and BMI.  Blood pressure.  Lipid and cholesterol levels. These may be checked every 5 years, or more frequently if you are over 42 years old.  Skin check.  Lung cancer screening. You may have this screening every year starting at age 27 if you have a 30-pack-year history of smoking and currently smoke or have quit within the past 15 years.  Fecal occult blood test (FOBT) of the stool. You may have this test every year starting at age 9.  Flexible sigmoidoscopy or colonoscopy. You may have a sigmoidoscopy every 5 years or a colonoscopy every 10 years starting at age 2.  Prostate cancer screening. Recommendations will vary depending on your family history and other risks.  Hepatitis C blood test.  Hepatitis B blood test.  Sexually transmitted disease (STD) testing.  Diabetes screening. This is done by checking your  blood sugar (glucose) after you have not eaten for a while (fasting). You may have this done every 1-3 years.  Abdominal aortic aneurysm (AAA) screening. You may need this if you are a current or former smoker.  Osteoporosis. You may be screened starting at age 78 if you are at high risk. Talk with your health care provider about your test results, treatment options, and if necessary, the need for more tests. Vaccines  Your health care  provider may recommend certain vaccines, such as:  Influenza vaccine. This is recommended every year.  Tetanus, diphtheria, and acellular pertussis (Tdap, Td) vaccine. You may need a Td booster every 10 years.  Zoster vaccine. You may need this after age 56.  Pneumococcal 13-valent conjugate (PCV13) vaccine. One dose is recommended after age 50.  Pneumococcal polysaccharide (PPSV23) vaccine. One dose is recommended after age 46. Talk to your health care provider about which screenings and vaccines you need and how often you need them. This information is not intended to replace advice given to you by your health care provider. Make sure you discuss any questions you have with your health care provider. Document Released: 11/25/2015 Document Revised: 07/18/2016 Document Reviewed: 08/30/2015 Elsevier Interactive Patient Education  2017 [place] Prevention in the Home Falls can cause injuries. They can happen to people of all ages. There are many things you can do to make your home safe and to help prevent falls. What can I do on the outside of my home?  Regularly fix the edges of walkways and driveways and fix any cracks.  Remove anything that might make you trip as you walk through a door, such as a raised step or threshold.  Trim any bushes or trees on the path to your home.  Use bright outdoor lighting.  Clear any walking paths of anything that might make someone trip, such as rocks or tools.  Regularly check to see if handrails are loose or broken. Make sure that both sides of any steps have handrails.  Any raised decks and porches should have guardrails on the edges.  Have any leaves, snow, or ice cleared regularly.  Use sand or salt on walking paths during winter.  Clean up any spills in your garage right away. This includes oil or grease spills. What can I do in the bathroom?  Use night lights.  Install grab bars by the toilet and in the tub and shower. Do  not use towel bars as grab bars.  Use non-skid mats or decals in the tub or shower.  If you need to sit down in the shower, use a plastic, non-slip stool.  Keep the floor dry. Clean up any water that spills on the floor as soon as it happens.  Remove soap buildup in the tub or shower regularly.  Attach bath mats securely with double-sided non-slip rug tape.  Do not have throw rugs and other things on the floor that can make you trip. What can I do in the bedroom?  Use night lights.  Make sure that you have a light by your bed that is easy to reach.  Do not use any sheets or blankets that are too big for your bed. They should not hang down onto the floor.  Have a firm chair that has side arms. You can use this for support while you get dressed.  Do not have throw rugs and other things on the floor that can make you trip. What can I do in  the kitchen?  Clean up any spills right away.  Avoid walking on wet floors.  Keep items that you use a lot in easy-to-reach places.  If you need to reach something above you, use a strong step stool that has a grab bar.  Keep electrical cords out of the way.  Do not use floor polish or wax that makes floors slippery. If you must use wax, use non-skid floor wax.  Do not have throw rugs and other things on the floor that can make you trip. What can I do with my stairs?  Do not leave any items on the stairs.  Make sure that there are handrails on both sides of the stairs and use them. Fix handrails that are broken or loose. Make sure that handrails are as long as the stairways.  Check any carpeting to make sure that it is firmly attached to the stairs. Fix any carpet that is loose or worn.  Avoid having throw rugs at the top or bottom of the stairs. If you do have throw rugs, attach them to the floor with carpet tape.  Make sure that you have a light switch at the top of the stairs and the bottom of the stairs. If you do not have them,  ask someone to add them for you. What else can I do to help prevent falls?  Wear shoes that:  Do not have high heels.  Have rubber bottoms.  Are comfortable and fit you well.  Are closed at the toe. Do not wear sandals.  If you use a stepladder:  Make sure that it is fully opened. Do not climb a closed stepladder.  Make sure that both sides of the stepladder are locked into place.  Ask someone to hold it for you, if possible.  Clearly mark and make sure that you can see:  Any grab bars or handrails.  First and last steps.  Where the edge of each step is.  Use tools that help you move around (mobility aids) if they are needed. These include:  Canes.  Walkers.  Scooters.  Crutches.  Turn on the lights when you go into a dark area. Replace any light bulbs as soon as they burn out.  Set up your furniture so you have a clear path. Avoid moving your furniture around.  If any of your floors are uneven, fix them.  If there are any pets around you, be aware of where they are.  Review your medicines with your doctor. Some medicines can make you feel dizzy. This can increase your chance of falling. Ask your doctor what other things that you can do to help prevent falls. This information is not intended to replace advice given to you by your health care provider. Make sure you discuss any questions you have with your health care provider. Document Released: 08/25/2009 Document Revised: 04/05/2016 Document Reviewed: 12/03/2014 Elsevier Interactive Patient Education  2017 [employer].

## 2020-07-15 ENCOUNTER — Encounter: Payer: Self-pay | Admitting: Internal Medicine

## 2020-07-15 ENCOUNTER — Other Ambulatory Visit: Payer: Self-pay

## 2020-07-15 ENCOUNTER — Ambulatory Visit (INDEPENDENT_AMBULATORY_CARE_PROVIDER_SITE_OTHER): Payer: Medicare Other

## 2020-07-15 ENCOUNTER — Ambulatory Visit (INDEPENDENT_AMBULATORY_CARE_PROVIDER_SITE_OTHER): Payer: Medicare Other | Admitting: Internal Medicine

## 2020-07-15 VITALS — BP 120/64 | HR 65 | Temp 97.8°F | Resp 15 | Ht 68.0 in | Wt 147.4 lb

## 2020-07-15 DIAGNOSIS — G8929 Other chronic pain: Secondary | ICD-10-CM | POA: Diagnosis not present

## 2020-07-15 DIAGNOSIS — R600 Localized edema: Secondary | ICD-10-CM | POA: Diagnosis not present

## 2020-07-15 DIAGNOSIS — D509 Iron deficiency anemia, unspecified: Secondary | ICD-10-CM | POA: Diagnosis not present

## 2020-07-15 DIAGNOSIS — M25561 Pain in right knee: Secondary | ICD-10-CM

## 2020-07-15 LAB — CBC WITH DIFFERENTIAL/PLATELET
Basophils Absolute: 0.1 10*3/uL (ref 0.0–0.1)
Basophils Relative: 1.7 % (ref 0.0–3.0)
Eosinophils Absolute: 0.3 10*3/uL (ref 0.0–0.7)
Eosinophils Relative: 4.8 % (ref 0.0–5.0)
HCT: 37 % — ABNORMAL LOW (ref 39.0–52.0)
Hemoglobin: 12.1 g/dL — ABNORMAL LOW (ref 13.0–17.0)
Lymphocytes Relative: 13.1 % (ref 12.0–46.0)
Lymphs Abs: 0.8 10*3/uL (ref 0.7–4.0)
MCHC: 32.8 g/dL (ref 30.0–36.0)
MCV: 103.2 fl — ABNORMAL HIGH (ref 78.0–100.0)
Monocytes Absolute: 0.6 10*3/uL (ref 0.1–1.0)
Monocytes Relative: 9.9 % (ref 3.0–12.0)
Neutro Abs: 4.2 10*3/uL (ref 1.4–7.7)
Neutrophils Relative %: 70.5 % (ref 43.0–77.0)
Platelets: 204 10*3/uL (ref 150.0–400.0)
RBC: 3.59 Mil/uL — ABNORMAL LOW (ref 4.22–5.81)
RDW: 15.3 % (ref 11.5–15.5)
WBC: 5.9 10*3/uL (ref 4.0–10.5)

## 2020-07-15 LAB — COMPREHENSIVE METABOLIC PANEL
ALT: 11 U/L (ref 0–53)
AST: 16 U/L (ref 0–37)
Albumin: 3.8 g/dL (ref 3.5–5.2)
Alkaline Phosphatase: 45 U/L (ref 39–117)
BUN: 21 mg/dL (ref 6–23)
CO2: 30 mEq/L (ref 19–32)
Calcium: 8.9 mg/dL (ref 8.4–10.5)
Chloride: 102 mEq/L (ref 96–112)
Creatinine, Ser: 1.05 mg/dL (ref 0.40–1.50)
GFR: 65.32 mL/min (ref >60.00)
Glucose, Bld: 125 mg/dL — ABNORMAL HIGH (ref 70–99)
Potassium: 3.8 mEq/L (ref 3.5–5.1)
Sodium: 140 mEq/L (ref 135–145)
Total Bilirubin: 1.1 mg/dL (ref 0.2–1.2)
Total Protein: 6.3 g/dL (ref 6.0–8.3)

## 2020-07-15 LAB — IBC + FERRITIN
Ferritin: 98.1 ng/mL (ref 22.0–322.0)
Iron: 154 ug/dL (ref 42–165)
Saturation Ratios: 47 % (ref 20.0–50.0)
Transferrin: 234 mg/dL (ref 212.0–360.0)

## 2020-07-15 NOTE — Progress Notes (Signed)
Subjective:  Patient ID: Powers Powers, male    DOB: 11/21/1920  Age: 86 y.o. MRN: [id number]  CC: The primary encounter diagnosis was Chronic pain of right knee. Diagnoses of Iron deficiency anemia, unspecified iron deficiency anemia type and Fluid retention in legs were also pertinent to this visit.  HPI Powers Powers presents for TREATMENT AND EVALUATION OF RIGHT KNEE PAIN   This visit occurred during the SARS-CoV-2 public health emergency.  Safety protocols were in place, including screening questions prior to the visit, additional usage of staff PPE, and extensive cleaning of exam room while observing appropriate contact time as indicated for disinfecting solutions.    Patient has received both doses of the available COVID 19 vaccine without complications.  Patient continues to mask when outside of the home except when walking in yard or at safe distances from others .  Patient denies any change in mood or development of unhealthy behaviors resuting from the pandemic's restriction of activities and socialization.     Patient is accompanied by his daughter today  He feels generally well for age and  denies severe or even moderate knee pain.  He has a remote history of fall onto knee.  No x rays were done at time.    States that about 2-3 weeks ago, "a  piece of bone "worked its way through the skin on  the lateral side where there is a small scab.  Daughter Powers Powers became worried .  Scab has not changed in over a week  Weight is stable,  Appetite is good. Has not been walking regularly due to the heat and due to his extreme age.  Taking the iron supplement once daily with no complications.    Outpatient Medications Prior to Visit  Medication Sig Dispense Refill  . aspirin EC 81 MG tablet Take 81 mg by mouth daily.    . brinzolamide (AZOPT) 1 % ophthalmic suspension 1 drop 2 (two) times daily.    . ferrous fumarate-iron polysaccharide complex (TANDEM) 162-115.2 MG CAPS capsule Take 1  capsule by mouth daily with breakfast. 30 capsule 2  . HYDROCHLOROTHIAZIDE PO Take 5 mg by mouth daily.    . Travoprost, BAK Free, (TRAVATAN) 0.004 % SOLN ophthalmic solution 1 drop at bedtime.     No facility-administered medications prior to visit.    Review of Systems;  Patient denies headache, fevers, malaise, unintentional weight loss, skin rash, eye pain, sinus congestion and sinus pain, sore throat, dysphagia,  hemoptysis , cough, dyspnea, wheezing, chest pain, palpitations, orthopnea, edema, abdominal pain, nausea, melena, diarrhea, constipation, flank pain, dysuria, hematuria, urinary  Frequency, nocturia, numbness, tingling, seizures,  Focal weakness, Loss of consciousness,  Tremor, insomnia, depression, anxiety, and suicidal ideation.      Objective:  BP 120/64 (BP Location: Left Arm, Patient Position: Sitting, Cuff Size: Normal)   Pulse 65   Temp 97.8 F (36.6 C) (Oral)   Resp 15   Ht 5\' 8"  (1.727 m)   Wt 147 lb 6.4 oz (66.9 kg)   SpO2 96%   BMI 22.41 kg/m   BP Readings from Last 3 Encounters:  07/15/20 120/64  02/26/20 138/66  03/10/16 (!) 139/54    Wt Readings from Last 3 Encounters:  07/15/20 147 lb 6.4 oz (66.9 kg)  05/23/20 146 lb (66.2 kg)  02/26/20 146 lb 14.4 oz (66.6 kg)    General appearance: alert, cooperative and appears stated age Ears: normal TM's and external ear canals both ears Throat: lips, mucosa, and tongue  normal; teeth and gums normal Neck: no adenopathy, no carotid bruit, supple, symmetrical, trachea midline and thyroid not enlarged, symmetric, no tenderness/mass/nodules Back: symmetric, no curvature. ROM normal. No CVA tenderness. Lungs: clear to auscultation bilaterally Heart: regular rate and rhythm, S1, S2 normal, no murmur, click, rub or gallop Abdomen: soft, non-tender; bowel sounds normal; no masses,  no organomegaly Ext:  Right lateral knee with tiny scab.  No effusions, redness or deformities,  No tenderness to  palpation Pulses: 2+ and symmetric Skin: Skin color, texture, turgor normal. No rashes or lesions Lymph nodes: Cervical, supraclavicular, and axillary nodes normal.  No results found for: HGBA1C  Lab Results  Component Value Date   CREATININE 1.05 07/15/2020   CREATININE 1.12 (H) 02/26/2020    Lab Results  Component Value Date   WBC 5.9 07/15/2020   HGB 12.1 (L) 07/15/2020   HCT 37.0 (L) 07/15/2020   PLT 204.0 07/15/2020   GLUCOSE 125 (H) 07/15/2020   ALT 11 07/15/2020   AST 16 07/15/2020   NA 140 07/15/2020   K 3.8 07/15/2020   CL 102 07/15/2020   CREATININE 1.05 07/15/2020   BUN 21 07/15/2020   CO2 30 07/15/2020   TSH 2.39 02/26/2020    No results found.  Assessment & Plan:   Problem List Items Addressed This Visit      Unprioritized   Fluid retention in legs    Chronic , managed with hctz.  electrolytes are normal.   Lab Results  Component Value Date   NA 140 07/15/2020   K 3.8 07/15/2020   CL 102 07/15/2020   CO2 30 07/15/2020   Lab Results  Component Value Date   CREATININE 1.05 07/15/2020         Iron deficiency anemia    Workup was not done for source given his extreme age .  He has been taking iron supplements with resolution of iron deficiency and improvement of anemia.  Will reduce use to weekly  Lab Results  Component Value Date   IRON 154 07/15/2020   TIBC 290 04/21/2020   FERRITIN 98.1 07/15/2020   Lab Results  Component Value Date   WBC 5.9 07/15/2020   HGB 12.1 (L) 07/15/2020   HCT 37.0 (L) 07/15/2020   MCV 103.2 (H) 07/15/2020   PLT 204.0 07/15/2020         Relevant Orders   Comprehensive metabolic panel (Completed)   IBC + Ferritin (Completed)   CBC with Differential/Platelet (Completed)   Knee pain, chronic - Primary    There is no evidence on plain films of a prior fracture of the patella or tib/fib and no bone fragments.       Relevant Orders   DG Knee Complete 4 Views Right (Completed)     I provided  30  minutes of  face-to-face time during this encounter reviewing patient's current problems and past surgeries, labs and imaging studies, providing counseling on the above mentioned problems , and coordination  of care .  I am having Powers Powers maintain his aspirin EC, HYDROCHLOROTHIAZIDE PO, brinzolamide, Travoprost (BAK Free), and ferrous fumarate-iron polysaccharide complex.  No orders of the defined types were placed in this encounter.   There are no discontinued medications.  Follow-up: No follow-ups on file.   Crecencio Mc, MD

## 2020-07-15 NOTE — Patient Instructions (Addendum)
Jason Powers,  Good to see you!  You can take up to 2000 mg of acetominophen (tylenol) every day safely  In divided doses (500 mg every 6 hours,   Or 1000 mg every 12 hours.)   xrays of knee   Watch that scab , if it doesn't go away in another month ,  Go see Dr Phillip Heal

## 2020-07-18 DIAGNOSIS — G8929 Other chronic pain: Secondary | ICD-10-CM | POA: Insufficient documentation

## 2020-07-18 NOTE — Assessment & Plan Note (Signed)
There is no evidence on plain films of a prior fracture of the patella or tib/fib and no bone fragments.

## 2020-07-18 NOTE — Assessment & Plan Note (Addendum)
Workup was not done for source given his extreme age .  He has been taking iron supplements with resolution of iron deficiency and improvement of anemia.  Will reduce use to weekly  Lab Results  Component Value Date   IRON 154 07/15/2020   TIBC 290 04/21/2020   FERRITIN 98.1 07/15/2020   Lab Results  Component Value Date   WBC 5.9 07/15/2020   HGB 12.1 (L) 07/15/2020   HCT 37.0 (L) 07/15/2020   MCV 103.2 (H) 07/15/2020   PLT 204.0 07/15/2020

## 2020-07-18 NOTE — Assessment & Plan Note (Signed)
Chronic , managed with hctz.  electrolytes are normal.   Lab Results  Component Value Date   NA 140 07/15/2020   K 3.8 07/15/2020   CL 102 07/15/2020   CO2 30 07/15/2020   Lab Results  Component Value Date   CREATININE 1.05 07/15/2020

## 2020-07-31 DIAGNOSIS — Z03818 Encounter for observation for suspected exposure to other biological agents ruled out: Secondary | ICD-10-CM | POA: Diagnosis not present

## 2020-07-31 DIAGNOSIS — U071 COVID-19: Secondary | ICD-10-CM | POA: Diagnosis not present

## 2020-08-04 ENCOUNTER — Telehealth: Payer: Self-pay | Admitting: Internal Medicine

## 2020-08-04 NOTE — Telephone Encounter (Signed)
Yes he can use sudafed PE for the congestion.  If he is not short of breath he may not need the infusion,  But based on his age alone he qualifies.  You can make the referral.

## 2020-08-04 NOTE — Telephone Encounter (Signed)
Called and spoke with [name]. [name] states that [place] had seasonal allergy like symptoms around 07/22/20. No fever or body aches. On 07/29/20 he received a covid quick test that showed positive. Jiles was taken to [facility] on 07/31/20 and received another test. Positive test results came back on 08/02/20. [name] asks if there is any medication he could take to help with nasal congestion and drainage and if he qualifies to receive the Monoclonal Infusions.

## 2020-08-04 NOTE — Telephone Encounter (Signed)
Called and spoke to [place], She verbalized understanding and had no questions. She will speak to [place] and see if he is willing to do the infusion as she states he is not experiencing any shortness of breath. She will call back and let us know if he is willing for the referral.

## 2020-08-04 NOTE — Telephone Encounter (Signed)
Patient's daughter called stated that her father had been tested for covid and he tested positive has been to long so he can not get the infusions and wanted to know if it was something else he could take for the congestion. Call daughter [phone]

## 2020-08-05 NOTE — Telephone Encounter (Signed)
Antibody infusion referral has been called in.

## 2020-08-05 NOTE — Telephone Encounter (Signed)
Pt daughter called they would like to go ahead with the Infusions

## 2020-08-05 NOTE — Telephone Encounter (Signed)
Please call daughter when referral is placed [phone]

## 2020-08-06 ENCOUNTER — Encounter: Payer: Self-pay | Admitting: Family

## 2020-08-06 NOTE — Telephone Encounter (Signed)
Called to Discuss with patient about Covid symptoms and the use of the monoclonal antibody infusion for those with mild to moderate Covid symptoms and at a high risk of hospitalization.     Pt appears to qualify for this infusion due to co-morbid conditions and/or a member of an at-risk group in accordance with the FDA Emergency Use Authorization.    Unable to reach pt. Left VM. Sent MyChart message   Loel Dubonnet, NP

## 2020-08-07 ENCOUNTER — Other Ambulatory Visit: Payer: Self-pay | Admitting: Nurse Practitioner

## 2020-08-07 ENCOUNTER — Ambulatory Visit (HOSPITAL_COMMUNITY)
Admission: RE | Admit: 2020-08-07 | Discharge: 2020-08-07 | Disposition: A | Discharge status: Home / Self Care | Payer: Medicare Other | Source: Ambulatory Visit | Attending: Pulmonary Disease | Admitting: Pulmonary Disease

## 2020-08-07 ENCOUNTER — Other Ambulatory Visit: Payer: Self-pay | Admitting: Physician Assistant

## 2020-08-07 ENCOUNTER — Encounter: Payer: Self-pay | Admitting: Nurse Practitioner

## 2020-08-07 DIAGNOSIS — Z23 Encounter for immunization: Secondary | ICD-10-CM | POA: Insufficient documentation

## 2020-08-07 DIAGNOSIS — U071 COVID-19: Secondary | ICD-10-CM | POA: Diagnosis present

## 2020-08-07 MED ORDER — EPINEPHRINE 0.3 MG/0.3ML IJ SOAJ: 0.3000 mg | Freq: Once | INTRAMUSCULAR | Status: DC | PRN

## 2020-08-07 MED ORDER — DIPHENHYDRAMINE HCL 50 MG/ML IJ SOLN: 50.0000 mg | Freq: Once | INTRAMUSCULAR | Status: DC | PRN

## 2020-08-07 MED ORDER — ALBUTEROL SULFATE HFA 108 (90 BASE) MCG/ACT IN AERS: 2.0000 | INHALATION_SPRAY | Freq: Once | RESPIRATORY_TRACT | Status: DC | PRN

## 2020-08-07 MED ORDER — FAMOTIDINE IN NACL 20-0.9 MG/50ML-% IV SOLN: 20.0000 mg | Freq: Once | INTRAVENOUS | Status: DC | PRN

## 2020-08-07 MED ORDER — SODIUM CHLORIDE 0.9 % IV SOLN: INTRAVENOUS | Status: DC | PRN

## 2020-08-07 MED ORDER — METHYLPREDNISOLONE SODIUM SUCC 125 MG IJ SOLR: 125.0000 mg | Freq: Once | INTRAMUSCULAR | Status: DC | PRN

## 2020-08-07 MED ORDER — SODIUM CHLORIDE 0.9 % IV SOLN
1200.0000 mg | Freq: Once | INTRAVENOUS | Status: AC
  Administered 2020-08-07: 1200 mg via INTRAVENOUS

## 2020-08-07 NOTE — Progress Notes (Addendum)
  Diagnosis: COVID-19  Physician: Joya Gaskins  Procedure: Covid Infusion Clinic Med: casirivimab\imdevimab infusion - Provided patient with casirivimab\imdevimab fact sheet for patients, parents and caregivers prior to infusion.  Complications: No immediate complications noted.  Discharge: Discharged home   Chyrel Masson 08/07/2020

## 2020-08-07 NOTE — Progress Notes (Signed)
I connected by phone with Jason Powers on 08/07/2020 at 7:32 AM to discuss the potential use of an new treatment for mild to moderate COVID-19 viral infection in non-hospitalized patients.  This patient is a 86 y.o. male that meets the FDA criteria for Emergency Use Authorization of casirivimab\imdevimab.  Has a (+) direct SARS-CoV-2 viral test result  Has mild or moderate COVID-19   Is ? 84 years of age and weighs ? 40 kg  Is NOT hospitalized due to COVID-19  Is NOT requiring oxygen therapy or requiring an increase in baseline oxygen flow rate due to COVID-19  Is within 10 days of symptom onset  Has at least one of the high risk factor(s) for progression to severe COVID-19 and/or hospitalization as defined in EUA.  Specific high risk criteria : Older age (>/= 84 yo) and Cardiovascular disease or hypertension  Onset 9/17. Vaccinated.  I have spoken and communicated the following to the patient or parent/caregiver:  1. FDA has authorized the emergency use of casirivimab\imdevimab for the treatment of mild to moderate COVID-19 in adults and pediatric patients with positive results of direct SARS-CoV-2 viral testing who are 81 years of age and older weighing at least 40 kg, and who are at high risk for progressing to severe COVID-19 and/or hospitalization.  2. The significant known and potential risks and benefits of casirivimab\imdevimab, and the extent to which such potential risks and benefits are unknown.  3. Information on available alternative treatments and the risks and benefits of those alternatives, including clinical trials.  4. Patients treated with casirivimab\imdevimab should continue to self-isolate and use infection control measures (e.g., wear mask, isolate, social distance, avoid sharing personal items, clean and disinfect "high touch" surfaces, and frequent handwashing) according to CDC guidelines.   5. The patient or parent/caregiver has the option to accept or  refuse casirivimab\imdevimab .  After reviewing this information with the patient, the patient has agreed to receive one of the available covid 19 monoclonal antibodies and will be provided an appropriate fact sheet prior to infusion.[name], [place], AGNP-C [phone] ([place])

## 2020-08-07 NOTE — Discharge Instructions (Signed)

## 2020-08-08 NOTE — Telephone Encounter (Signed)
Called Linda back like requested. [name] states that Jason Powers has had the infusion yesterday at [facility] and that he is doing fine and is not having any reaction.

## 2020-08-29 ENCOUNTER — Encounter: Payer: Self-pay | Admitting: Internal Medicine

## 2020-08-29 ENCOUNTER — Ambulatory Visit (INDEPENDENT_AMBULATORY_CARE_PROVIDER_SITE_OTHER): Payer: Medicare Other | Admitting: Internal Medicine

## 2020-08-29 ENCOUNTER — Other Ambulatory Visit: Payer: Self-pay

## 2020-08-29 VITALS — BP 128/60 | HR 79 | Temp 97.8°F | Resp 15 | Ht 68.0 in | Wt 146.0 lb

## 2020-08-29 DIAGNOSIS — L603 Nail dystrophy: Secondary | ICD-10-CM | POA: Diagnosis not present

## 2020-08-29 DIAGNOSIS — R600 Localized edema: Secondary | ICD-10-CM

## 2020-08-29 DIAGNOSIS — R29898 Other symptoms and signs involving the musculoskeletal system: Secondary | ICD-10-CM | POA: Diagnosis not present

## 2020-08-29 DIAGNOSIS — M62838 Other muscle spasm: Secondary | ICD-10-CM

## 2020-08-29 DIAGNOSIS — D509 Iron deficiency anemia, unspecified: Secondary | ICD-10-CM

## 2020-08-29 LAB — BASIC METABOLIC PANEL
BUN: 20 mg/dL (ref 6–23)
CO2: 30 mEq/L (ref 19–32)
Calcium: 8.9 mg/dL (ref 8.4–10.5)
Chloride: 100 mEq/L (ref 96–112)
Creatinine, Ser: 1.03 mg/dL (ref 0.40–1.50)
GFR: 60.44 mL/min (ref >60.00)
Glucose, Bld: 112 mg/dL — ABNORMAL HIGH (ref 70–99)
Potassium: 4.1 mEq/L (ref 3.5–5.1)
Sodium: 139 mEq/L (ref 135–145)

## 2020-08-29 LAB — MAGNESIUM: Magnesium: 2 mg/dL (ref 1.5–2.5)

## 2020-08-29 MED ORDER — HYDROCHLOROTHIAZIDE 25 MG PO TABS
25.0000 mg | ORAL_TABLET | Freq: Every day | ORAL | 1 refills | Status: DC
Start: 2020-08-29 — End: 2021-05-23

## 2020-08-29 NOTE — Progress Notes (Signed)
Subjective:  Patient ID: Jason Powers, male    DOB: 07/10/1921  Age: 86 y.o. MRN: [id number]  CC: The primary encounter diagnosis was Dystrophic Powers. Diagnoses of Muscle spasm, Fluid retention in legs, Iron deficiency anemia, unspecified iron deficiency anemia type, and Bilateral leg weakness were also pertinent to this visit.  HPI Tagen Milby presents for 6 month follow up on IDA,  Knee pain and fluid retention managed with HCTZ  This visit occurred during the SARS-CoV-2 public health emergency.  Safety protocols were in place, including screening questions prior to the visit, additional usage of staff PPE, and extensive cleaning of exam room while observing appropriate contact time as indicated for disinfecting solutions.   He feels generally well and is brought in today by his daughter.   Taking iron pill once  A week   Taking hctz daily for chronic LE edema   Legs fell asleep today after prolonged sitting  In the office waiting room chair.   Toenails dystrophic .  Need podiatry referral to trim  uses a cane,  Refuses to use a walker  . liives alone.  Does  not have a medic alert for falls . No recent falls   Wants flu and pneumonia vaccines      Outpatient Medications Prior to Visit  Medication Sig Dispense Refill   aspirin EC 81 MG tablet Take 81 mg by mouth daily.     brinzolamide (AZOPT) 1 % ophthalmic suspension 1 drop 2 (two) times daily.     ferrous fumarate-iron polysaccharide complex (TANDEM) 162-115.2 MG CAPS capsule Take 1 capsule by mouth daily with breakfast. 30 capsule 2   Travoprost, BAK Free, (TRAVATAN) 0.004 % SOLN ophthalmic solution 1 drop at bedtime.     hydrochlorothiazide (HYDRODIURIL) 25 MG tablet Take by mouth.     HYDROCHLOROTHIAZIDE PO Take 5 mg by mouth daily. (Patient not taking: Reported on 08/29/2020)     No facility-administered medications prior to visit.    Review of Systems;  Patient denies headache, fevers, malaise,  unintentional weight loss, skin rash, eye pain, sinus congestion and sinus pain, sore throat, dysphagia,  hemoptysis , cough, dyspnea, wheezing, chest pain, palpitations, orthopnea, edema, abdominal pain, nausea, melena, diarrhea, constipation, flank pain, dysuria, hematuria, urinary  Frequency, nocturia, numbness, tingling, seizures,  Focal weakness, Loss of consciousness,  Tremor, insomnia, depression, anxiety, and suicidal ideation.      Objective:  BP 128/60 (BP Location: Left Arm, Patient Position: Sitting, Cuff Size: Normal)    Pulse 79    Temp 97.8 F (36.6 C) (Oral)    Resp 15    Ht 5\' 8"  (1.727 m)    Wt 146 lb (66.2 kg)    SpO2 97%    BMI 22.20 kg/m   BP Readings from Last 3 Encounters:  08/29/20 128/60  08/07/20 (!) 109/50  07/15/20 120/64    Wt Readings from Last 3 Encounters:  08/29/20 146 lb (66.2 kg)  07/15/20 147 lb 6.4 oz (66.9 kg)  05/23/20 146 lb (66.2 kg)    General appearance: alert, cooperative and appears stated age Ears: normal TM's and external ear canals both ears Throat: lips, mucosa, and tongue normal; teeth and gums normal Neck: no adenopathy, no carotid bruit, supple, symmetrical, trachea midline and thyroid not enlarged, symmetric, no tenderness/mass/nodules Back: symmetric, no curvature. ROM normal. No CVA tenderness. Lungs: clear to auscultation bilaterally Heart: regular rate and rhythm, S1, S2 normal, no murmur, click, rub or gallop Abdomen: soft, non-tender; bowel sounds normal; no  masses,  no organomegaly Pulses: 2+ and symmetric Skin: Skin color, texture, turgor normal. No rashes or lesions Lymph nodes: Cervical, supraclavicular, and axillary nodes normal.  No results found for: HGBA1C  Lab Results  Component Value Date   CREATININE 1.03 08/29/2020   CREATININE 1.05 07/15/2020   CREATININE 1.12 (H) 02/26/2020    Lab Results  Component Value Date   WBC 5.9 07/15/2020   HGB 12.1 (L) 07/15/2020   HCT 37.0 (L) 07/15/2020   PLT 204.0  07/15/2020   GLUCOSE 112 (H) 08/29/2020   ALT 11 07/15/2020   AST 16 07/15/2020   NA 139 08/29/2020   K 4.1 08/29/2020   CL 100 08/29/2020   CREATININE 1.03 08/29/2020   BUN 20 08/29/2020   CO2 30 08/29/2020   TSH 2.39 02/26/2020    No results found.  Assessment & Plan:   Problem List Items Addressed This Visit      Unprioritized   Bilateral leg weakness    Secondary to aging.  encouraged to use walker , which he declines.  Advised to use a medic alert to continue independent living safely.       Fluid retention in legs    Chronic , managed with hctz 25 mg daily.  BP is normal and   electrolytes are normal.   Lab Results  Component Value Date   NA 139 08/29/2020   K 4.1 08/29/2020   CL 100 08/29/2020   CO2 30 08/29/2020   Lab Results  Component Value Date   CREATININE 1.03 08/29/2020         Iron deficiency anemia    Workup was not done for source given his extreme age .  He has been taking iron supplements with resolution of iron deficiency and improvement of anemia.  Will continue once weekly dosing unless levels drop again    Lab Results  Component Value Date   IRON 154 07/15/2020   TIBC 290 04/21/2020   FERRITIN 98.1 07/15/2020   Lab Results  Component Value Date   WBC 5.9 07/15/2020   HGB 12.1 (L) 07/15/2020   HCT 37.0 (L) 07/15/2020   MCV 103.2 (H) 07/15/2020   PLT 204.0 07/15/2020          Other Visit Diagnoses    Dystrophic Powers    -  Primary   Relevant Orders   Ambulatory referral to Podiatry   Muscle spasm       Relevant Orders   Basic metabolic panel (Completed)   Magnesium (Completed)      I have discontinued Numan Oberman "Mack"'s HYDROCHLOROTHIAZIDE PO. I have also changed his hydrochlorothiazide. Additionally, I am having him maintain his aspirin EC, brinzolamide, Travoprost (BAK Free), and ferrous fumarate-iron polysaccharide complex.  Meds ordered this encounter  Medications   hydrochlorothiazide (HYDRODIURIL) 25 MG  tablet    Sig: Take 1 tablet (25 mg total) by mouth daily.    Dispense:  90 tablet    Refill:  1    KEEP ON FILE FOR FUTURE REFILLS    Medications Discontinued During This Encounter  Medication Reason   HYDROCHLOROTHIAZIDE PO    hydrochlorothiazide (HYDRODIURIL) 25 MG tablet Reorder    Follow-up: Return in about 6 months (around 02/27/2021).   Crecencio Mc, MD

## 2020-08-29 NOTE — Patient Instructions (Signed)
You received the flu and the pneumonia vaccines today  I recommend taking 500 mg tylenol every 6 hours today for the achiness you might get   I HIGHLY RECOMMEND that you get a MEDIC ALERT of some kind to wear so you can call for hep if you fall at home and can't get up

## 2020-08-30 DIAGNOSIS — R29898 Other symptoms and signs involving the musculoskeletal system: Secondary | ICD-10-CM | POA: Insufficient documentation

## 2020-08-30 NOTE — Assessment & Plan Note (Signed)
Workup was not done for source given his extreme age .  He has been taking iron supplements with resolution of iron deficiency and improvement of anemia.  Will continue once weekly dosing unless levels drop again    Lab Results  Component Value Date   IRON 154 07/15/2020   TIBC 290 04/21/2020   FERRITIN 98.1 07/15/2020   Lab Results  Component Value Date   WBC 5.9 07/15/2020   HGB 12.1 (L) 07/15/2020   HCT 37.0 (L) 07/15/2020   MCV 103.2 (H) 07/15/2020   PLT 204.0 07/15/2020

## 2020-08-30 NOTE — Assessment & Plan Note (Signed)
Secondary to aging.  encouraged to use walker , which he declines.  Advised to use a medic alert to continue independent living safely.

## 2020-08-30 NOTE — Assessment & Plan Note (Addendum)
Chronic , managed with hctz 25 mg daily.  BP is normal and   electrolytes are normal.   Lab Results  Component Value Date   NA 139 08/29/2020   K 4.1 08/29/2020   CL 100 08/29/2020   CO2 30 08/29/2020   Lab Results  Component Value Date   CREATININE 1.03 08/29/2020

## 2020-09-07 ENCOUNTER — Ambulatory Visit: Payer: Medicare Other | Admitting: Podiatry

## 2020-09-07 ENCOUNTER — Other Ambulatory Visit: Payer: Self-pay

## 2020-09-07 ENCOUNTER — Encounter: Payer: Self-pay | Admitting: Podiatry

## 2020-09-07 DIAGNOSIS — B351 Tinea unguium: Secondary | ICD-10-CM | POA: Diagnosis not present

## 2020-09-07 DIAGNOSIS — M79676 Pain in unspecified toe(s): Secondary | ICD-10-CM | POA: Diagnosis not present

## 2020-09-07 NOTE — Progress Notes (Signed)
He presents today chief complaint of painful elongated toenails 1 through 5 bilaterally.  Objective: Pulses are palpable.  No open lesions or wounds toenails are long thick yellow dystrophic-like mycotic painful palpation as well as debridement.  Assessment: Pain in limb secondary to onychomycosis.  Plan: Debridement of toenails 1 through 5 bilateral.

## 2020-12-12 ENCOUNTER — Ambulatory Visit: Payer: Medicare Other | Admitting: Podiatry

## 2020-12-28 ENCOUNTER — Ambulatory Visit: Payer: Medicare Other | Admitting: Podiatry

## 2020-12-28 ENCOUNTER — Other Ambulatory Visit: Payer: Self-pay

## 2020-12-28 ENCOUNTER — Encounter: Payer: Self-pay | Admitting: Podiatry

## 2020-12-28 DIAGNOSIS — B351 Tinea unguium: Secondary | ICD-10-CM | POA: Diagnosis not present

## 2020-12-28 DIAGNOSIS — M79676 Pain in unspecified toe(s): Secondary | ICD-10-CM

## 2020-12-28 NOTE — Progress Notes (Signed)
Presents today chief complaint of painfully elongated toenails 1 through 5 bilaterally.  Objective: Is a long thick yellow dystrophic-like mycotic painful palpation as well as debridement.  No open lesions or wounds are noted.  Assessment: Pain in limb secondary to onychomycosis.  Plan: Debridement of toenails 1 through 5 bilaterally.

## 2021-02-27 ENCOUNTER — Ambulatory Visit: Payer: Medicare Other | Admitting: Internal Medicine

## 2021-03-21 ENCOUNTER — Ambulatory Visit (INDEPENDENT_AMBULATORY_CARE_PROVIDER_SITE_OTHER): Payer: Medicare Other | Admitting: Internal Medicine

## 2021-03-21 ENCOUNTER — Other Ambulatory Visit: Payer: Self-pay

## 2021-03-21 ENCOUNTER — Encounter: Payer: Self-pay | Admitting: Internal Medicine

## 2021-03-21 VITALS — BP 140/60 | HR 73 | Temp 97.1°F | Resp 15 | Ht 68.0 in | Wt 147.8 lb

## 2021-03-21 DIAGNOSIS — R5383 Other fatigue: Secondary | ICD-10-CM | POA: Diagnosis not present

## 2021-03-21 DIAGNOSIS — D509 Iron deficiency anemia, unspecified: Secondary | ICD-10-CM

## 2021-03-21 MED ORDER — ZOSTER VAC RECOMB ADJUVANTED 50 MCG/0.5ML IM SUSR: 0.5000 mL | Freq: Once | INTRAMUSCULAR | 1 refills | Status: AC

## 2021-03-21 NOTE — Progress Notes (Signed)
Subjective:  Patient ID: Jason Powers, male    DOB: [birth year]  Age: 87 y.o. MRN: [id number]  CC: The primary encounter diagnosis was Fatigue, unspecified type. A diagnosis of Iron deficiency anemia, unspecified iron deficiency anemia type was also pertinent to this visit.  HPI Jason Powers presents for FOLLOW UP ON HYPERTENSION AND IRON DEFICIENCY  This visit occurred during the SARS-CoV-2 public health emergency.  Safety protocols were in place, including screening questions prior to the visit, additional usage of staff PPE, and extensive cleaning of exam room while observing appropriate contact time as indicated for disinfecting solutions.   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  He is accompanied by his daughter.  He feels generally well   He has mild OA managed with tylenol . He has mild iron deficiency and has been taking a ferrous sulfate supplement once a week .   Outpatient Medications Prior to Visit  Medication Sig Dispense Refill  . aspirin EC 81 MG tablet Take 81 mg by mouth daily.    . brinzolamide (AZOPT) 1 % ophthalmic suspension 1 drop 2 (two) times daily.    . ferrous fumarate-iron polysaccharide complex (TANDEM) 162-115.2 MG CAPS capsule Take 1 capsule by mouth daily with breakfast. 30 capsule 2  . hydrochlorothiazide (HYDRODIURIL) 25 MG tablet Take 1 tablet (25 mg total) by mouth daily. 90 tablet 1  . Travoprost, BAK Free, (TRAVATAN) 0.004 % SOLN ophthalmic solution 1 drop at bedtime.     No facility-administered medications prior to visit.    Review of Systems;  Patient denies headache, fevers, malaise, unintentional weight loss, skin rash, eye pain, sinus congestion and sinus pain, sore throat, dysphagia,  hemoptysis , cough, dyspnea, wheezing, chest pain, palpitations, orthopnea, edema, abdominal pain, nausea, melena, diarrhea, constipation, flank pain, dysuria, hematuria, urinary  Frequency,  nocturia, numbness, tingling, seizures,  Focal weakness, Loss of consciousness,  Tremor, insomnia, depression, anxiety, and suicidal ideation.      Objective:  BP 140/60 (BP Location: Left Arm, Patient Position: Sitting, Cuff Size: Normal)   Pulse 73   Temp (!) 97.1 F (36.2 C) (Temporal)   Resp 15   Ht 5\' 8"  (1.727 m)   Wt 147 lb 12.8 oz (67 kg)   SpO2 94%   BMI 22.47 kg/m   BP Readings from Last 3 Encounters:  03/21/21 140/60  08/29/20 128/60  08/07/20 (!) 109/50    Wt Readings from Last 3 Encounters:  03/21/21 147 lb 12.8 oz (67 kg)  08/29/20 146 lb (66.2 kg)  07/15/20 147 lb 6.4 oz (66.9 kg)    General appearance: alert, cooperative and appears stated age Ears: normal TM's and external ear canals both ears Throat: lips, mucosa, and tongue normal; teeth and gums normal Neck: no adenopathy, no carotid bruit, supple, symmetrical, trachea midline and thyroid not enlarged, symmetric, no tenderness/mass/nodules Back: symmetric, no curvature. ROM normal. No CVA tenderness. Lungs: clear to auscultation bilaterally Heart: regular rate and rhythm, S1, S2 normal, no murmur, click, rub or gallop Abdomen: soft, non-tender; bowel sounds normal; no masses,  no organomegaly Pulses: 2+ and symmetric Skin: Skin color, texture, turgor normal. No rashes or lesions Lymph nodes: Cervical, supraclavicular, and axillary nodes normal.  No results found for: HGBA1C  Lab Results  Component Value Date   CREATININE 1.01 03/21/2021   CREATININE 1.03 08/29/2020   CREATININE 1.05 07/15/2020    Lab Results  Component Value Date   WBC 6.0 03/21/2021  HGB 11.5 (L) 03/21/2021   HCT 34.7 (L) 03/21/2021   PLT 199.0 03/21/2021   GLUCOSE 106 (H) 03/21/2021   ALT 12 03/21/2021   AST 17 03/21/2021   NA 141 03/21/2021   K 4.0 03/21/2021   CL 104 03/21/2021   CREATININE 1.01 03/21/2021   BUN 28 (H) 03/21/2021   CO2 30 03/21/2021   TSH 2.45 03/21/2021    No results found.  Assessment &  Plan:   Problem List Items Addressed This Visit      Unprioritized   Fatigue - Primary   Relevant Orders   Comprehensive metabolic panel (Completed)   TSH (Completed)   Iron deficiency anemia    Workup was not done for source given his extreme age .  Levels have normalized since He has been taking iron supplements  Lab Results  Component Value Date   IRON 90 03/21/2021   TIBC 290 04/21/2020   FERRITIN 98.4 03/21/2021   Lab Results  Component Value Date   WBC 6.0 03/21/2021   HGB 11.5 (L) 03/21/2021   HCT 34.7 (L) 03/21/2021   MCV 102.2 (H) 03/21/2021   PLT 199.0 03/21/2021         Relevant Orders   IBC + Ferritin (Completed)   CBC with Differential/Platelet (Completed)      I am having Jason Powers" start on Zoster Vaccine Adjuvanted. I am also having him maintain his aspirin EC, brinzolamide, Travoprost (BAK Free), ferrous fumarate-iron polysaccharide complex, and hydrochlorothiazide.  Meds ordered this encounter  Medications  . Zoster Vaccine Adjuvanted [facility]) injection    Sig: Inject 0.5 mLs into the muscle once for 1 dose.    Dispense:  1 each    Refill:  1    There are no discontinued medications.  Follow-up: Return in about 6 months (around 09/21/2021).   Crecencio Mc, MD

## 2021-03-21 NOTE — Patient Instructions (Addendum)
You are doing very well!  You have not aged one bit! Keep doing what are doing !   I do recommend the new shingles vaccine.  The ShingRx vaccine is now available in local pharmacies and is much more protective than the old one  Zostavax  (it is about 97%  Effective in preventing shingles). .   It is therefore ADVISED for all interested adults over 50 to prevent shingles so I have printed you a prescription for it.  (it requires a 2nd dose 2 too 6 months after the first one) .  It will cause you to have flu  like symptoms for 2 days

## 2021-03-22 LAB — COMPREHENSIVE METABOLIC PANEL
ALT: 12 U/L (ref 0–53)
AST: 17 U/L (ref 0–37)
Albumin: 4 g/dL (ref 3.5–5.2)
Alkaline Phosphatase: 45 U/L (ref 39–117)
BUN: 28 mg/dL — ABNORMAL HIGH (ref 6–23)
CO2: 30 mEq/L (ref 19–32)
Calcium: 9 mg/dL (ref 8.4–10.5)
Chloride: 104 mEq/L (ref 96–112)
Creatinine, Ser: 1.01 mg/dL (ref 0.40–1.50)
GFR: 62.15 mL/min (ref >60.00)
Glucose, Bld: 106 mg/dL — ABNORMAL HIGH (ref 70–99)
Potassium: 4 mEq/L (ref 3.5–5.1)
Sodium: 141 mEq/L (ref 135–145)
Total Bilirubin: 0.5 mg/dL (ref 0.2–1.2)
Total Protein: 6.6 g/dL (ref 6.0–8.3)

## 2021-03-22 LAB — CBC WITH DIFFERENTIAL/PLATELET
Basophils Absolute: 0.1 10*3/uL (ref 0.0–0.1)
Basophils Relative: 2.4 % (ref 0.0–3.0)
Eosinophils Absolute: 0.3 10*3/uL (ref 0.0–0.7)
Eosinophils Relative: 4.8 % (ref 0.0–5.0)
HCT: 34.7 % — ABNORMAL LOW (ref 39.0–52.0)
Hemoglobin: 11.5 g/dL — ABNORMAL LOW (ref 13.0–17.0)
Lymphocytes Relative: 16.2 % (ref 12.0–46.0)
Lymphs Abs: 1 10*3/uL (ref 0.7–4.0)
MCHC: 33.3 g/dL (ref 30.0–36.0)
MCV: 102.2 fl — ABNORMAL HIGH (ref 78.0–100.0)
Monocytes Absolute: 0.6 10*3/uL (ref 0.1–1.0)
Monocytes Relative: 10.3 % (ref 3.0–12.0)
Neutro Abs: 4 10*3/uL (ref 1.4–7.7)
Neutrophils Relative %: 66.3 % (ref 43.0–77.0)
Platelets: 199 10*3/uL (ref 150.0–400.0)
RBC: 3.39 Mil/uL — ABNORMAL LOW (ref 4.22–5.81)
RDW: 15.3 % (ref 11.5–15.5)
WBC: 6 10*3/uL (ref 4.0–10.5)

## 2021-03-22 LAB — TSH: TSH: 2.45 u[IU]/mL (ref 0.35–4.50)

## 2021-03-22 LAB — IBC + FERRITIN
Ferritin: 98.4 ng/mL (ref 22.0–322.0)
Iron: 90 ug/dL (ref 42–165)
Saturation Ratios: 27.7 % (ref 20.0–50.0)
Transferrin: 232 mg/dL (ref 212.0–360.0)

## 2021-03-23 NOTE — Assessment & Plan Note (Signed)
Workup was not done for source given his extreme age .  Levels have normalized since He has been taking iron supplements  Lab Results  Component Value Date   IRON 90 03/21/2021   TIBC 290 04/21/2020   FERRITIN 98.4 03/21/2021   Lab Results  Component Value Date   WBC 6.0 03/21/2021   HGB 11.5 (L) 03/21/2021   HCT 34.7 (L) 03/21/2021   MCV 102.2 (H) 03/21/2021   PLT 199.0 03/21/2021

## 2021-04-03 ENCOUNTER — Other Ambulatory Visit: Payer: Self-pay

## 2021-04-03 ENCOUNTER — Ambulatory Visit: Payer: Medicare Other | Admitting: Podiatry

## 2021-04-03 ENCOUNTER — Encounter: Payer: Self-pay | Admitting: Podiatry

## 2021-04-03 DIAGNOSIS — B351 Tinea unguium: Secondary | ICD-10-CM

## 2021-04-03 DIAGNOSIS — M79676 Pain in unspecified toe(s): Secondary | ICD-10-CM

## 2021-04-03 NOTE — Progress Notes (Signed)
He presents today chief complaint of painfully elongated toenails.  Objective: Pulses remain strong and palpable toenails are long thick yellow dystrophic-like mycotic there is no erythema edema cellulitis drainage or odor.  Assessment: Pain in limb secondary onychomycosis.  Plan: Debridement of toenails 1 through 5 bilateral.  Follow-up with him in 3 months

## 2021-05-04 DIAGNOSIS — Z961 Presence of intraocular lens: Secondary | ICD-10-CM | POA: Diagnosis not present

## 2021-05-23 ENCOUNTER — Other Ambulatory Visit: Payer: Self-pay | Admitting: Internal Medicine

## 2021-05-24 ENCOUNTER — Ambulatory Visit: Payer: Medicare Other

## 2021-06-06 ENCOUNTER — Telehealth: Payer: Self-pay | Admitting: Internal Medicine

## 2021-06-06 DIAGNOSIS — M549 Dorsalgia, unspecified: Secondary | ICD-10-CM

## 2021-06-06 NOTE — Telephone Encounter (Signed)
Pt daughter called she wasn't with the pt so I couldn't triage  She stated that he feel last night in the shower an is now having back pain and is sore all over. Pt didn't go to the ED  Daughter wanted to know what she needed to do for him

## 2021-06-07 ENCOUNTER — Encounter: Payer: Self-pay | Admitting: Emergency Medicine

## 2021-06-07 ENCOUNTER — Other Ambulatory Visit: Payer: Self-pay

## 2021-06-07 ENCOUNTER — Ambulatory Visit
Admission: EM | Admit: 2021-06-07 | Discharge: 2021-06-07 | Disposition: A | Discharge status: Home / Self Care | Payer: Medicare Other

## 2021-06-07 DIAGNOSIS — S300XXA Contusion of lower back and pelvis, initial encounter: Secondary | ICD-10-CM

## 2021-06-07 DIAGNOSIS — M545 Low back pain, unspecified: Secondary | ICD-10-CM

## 2021-06-07 DIAGNOSIS — W19XXXA Unspecified fall, initial encounter: Secondary | ICD-10-CM | POA: Diagnosis not present

## 2021-06-07 DIAGNOSIS — Y92009 Unspecified place in unspecified non-institutional (private) residence as the place of occurrence of the external cause: Secondary | ICD-10-CM

## 2021-06-07 NOTE — Discharge Instructions (Signed)
Follow up with PCP to inform of visit and treatment in clinic today as orthopedic or PT referral may be needed if symptoms persist longer than 2-3 weeks.  Increase fluid intake. Voltaren gel to right lower back twice daily. You may also use Salonpas patches as needed to painful regions. Apply ice to affected area 3-5 times daily for 15-20 minute intervals.  You may use heat first thing in the morning and last thing at night, otherwise use ice as directed. Take all medications as prescribed. May take 500 mg Tylenol OR 400 mg ibuprofen every 8 hours as needed for pain as long as neither medication is contraindicated to your current health conditions. Pillow underneath knees at night to sleep on your back and/or pillow in between knees to sleep on side. You may use topical analgesics such as BenGay to help with muscle tension to right lower back and buttock. Return to clinic if symptoms worsen. If you experience any significant worsening of pain, bowel or bladder incontinence, groin paresthesias, fever or chills go to the ER.

## 2021-06-07 NOTE — ED Triage Notes (Signed)
Patient c/o Fall at [place] on Monday morning.   Patient states " I fell backwards onto my back and bottom in the bathroom".   Patient denies LOC.   Patient endorses bilateral lower back pain.   Patient endorses increased pain when laying and trying to "get up from chair".   Patient hasn't used any medications for symptoms.

## 2021-06-07 NOTE — Telephone Encounter (Signed)
Spoke with pt's daughter and advised her per Dr. Lupita Dawn message to give pt 1000 mg of tylenol every 8 hours as needed for pain. Daughter gave a verbal understanding.

## 2021-06-07 NOTE — ED Provider Notes (Signed)
Chief Complaint   Chief Complaint  Patient presents with   Fall     Subjective, HPI  Jason Powers is a 87 y.o. male who presents with right lower back and buttock discomfort after falling at home in the bathroom.  Patient reports increased pain when going from a sitting to a standing/standing to sitting and turning over in bed.  Patient has not used any medications for his current symptoms.  Patient denies any LOC. No bowel or bladder incontinence, urinary retention, or groin paresthesias. No fever or chills.  History obtained from patient.   Patient's problem list, past medical and social history, medications, and allergies were reviewed by me and updated in Epic.    ROS  See HPI.  Objective   Vitals:   06/07/21 0846  BP: (!) 130/59  Pulse: 67  Resp: 13  Temp: 98 F (36.7 C)  SpO2: 94%     General: Appears well-developed and well-nourished. No acute distress.  Head: Normocephalic and atraumatic.   Neck: Normal range of motion, neck is supple.  Cardiovascular: Normal rate.  Pulm/Chest: No respiratory distress.  Musculoskeletal: Lumbar Spine: Mild TTP noted to right lower back musculature.  Mild TTP noted to right buttock with small adjacent bruising measuring approximately 1.5 cm x 1.5 cm. No midline tenderness or step-off. BLE with 5/5 strength, full sensation.  Unable to assess gait, patient is wheelchair-bound. Neurological: Alert and oriented to person, place, and time.  Skin: Skin is warm and dry.   Psychiatric: Normal mood, affect, behavior, and thought content.   Vital signs and nursing note reviewed.   Data  Imaging None needed.  No midline spinal tenderness or tenderness to bony regions of hips.  Assessment & Plan  1. Fall in home, initial encounter  2. Acute right-sided low back pain without sciatica  3. Contusion, buttock, initial encounter  No orders of the defined types were placed in this encounter.   87 y.o. male presents with right lower  back and buttock discomfort after falling at home in the bathroom.  Patient reports increased pain when going from a sitting to a standing/standing to sitting and turning over in bed.  Patient has not used any medications for his current symptoms.  Patient denies any LOC. No bowel or bladder incontinence, urinary retention, or groin paresthesias. No fever or chills.  Chart review completed.  Given symptoms along with assessment findings, likely contusion along with right-sided low back pain from fall.  No concern at this time for underlying fractures, as such I did not obtain imaging in clinic today.  Patient is stable and able to stand without assistance.  Advised about home treatment and care to include Voltaren gel and Salonpas patches along with ice/heat and Tylenol/ibuprofen as long as the patient has no medical condition for which she is unable to use these medications.  Advised to follow-up with PCP if his symptoms not improve over the next 2 to 3 weeks as he may require physical therapy or further evaluation at that time.  Patient verbalized understanding and agreed with plan.  Patient stable upon discharge.  Return as needed.  Plan:   Discharge Instructions      Follow up with PCP to inform of visit and treatment in clinic today as orthopedic or PT referral may be needed if symptoms persist longer than 2-3 weeks.  Increase fluid intake. Voltaren gel to right lower back twice daily. You may also use Salonpas patches as needed to painful regions. Apply ice to  affected area 3-5 times daily for 15-20 minute intervals.  You may use heat first thing in the morning and last thing at night, otherwise use ice as directed. Take all medications as prescribed. May take 500 mg Tylenol OR 400 mg ibuprofen every 8 hours as needed for pain as long as neither medication is contraindicated to your current health conditions. Pillow underneath knees at night to sleep on your back and/or pillow in between knees  to sleep on side. You may use topical analgesics such as BenGay to help with muscle tension to right lower back and buttock. Return to clinic if symptoms worsen. If you experience any significant worsening of pain, bowel or bladder incontinence, groin paresthesias, fever or chills go to the ER.         Serafina Royals, FNP-C 06/07/21   This note was partially made with the aid of speech-to-text dictation; typographical errors are not intentional.    Serafina Royals, [facility] 06/07/21 [phone]

## 2021-06-07 NOTE — Telephone Encounter (Signed)
Spoke with pt's daughter and she stated that they are at [facility] on [address] [place] right now. She stated that they were able to see an orthopedic doctor there and he has confirmed that nothing is broken and advised that the pt use lidocaine patches to help with the pain.

## 2021-06-12 NOTE — Telephone Encounter (Signed)
Pt's daughter [name] called and states that he went to he checked out by the ED and that they did not find anything except a bruise from his fall. She wants to know if pt can have a back xray ordered because they did not do it in the hospital?? Please call daughter back at [phone]

## 2021-06-12 NOTE — Telephone Encounter (Signed)
Thoracic and lumbar films ordered for [facility]

## 2021-06-12 NOTE — Addendum Note (Signed)
Addended by: Crecencio Mc on: 06/12/2021 04:42 PM   Modules accepted: Orders

## 2021-06-12 NOTE — Telephone Encounter (Signed)
Spoke with pt's daughter to let her know that the x rays have been ordered and that they can go over to [facility] to have the xrays done. They are aware that no appt is needed.

## 2021-06-12 NOTE — Telephone Encounter (Signed)
Spoke with pt's daughter and she stated that the pt is complaining of pain in the mid back to lower back.

## 2021-06-13 ENCOUNTER — Ambulatory Visit
Admission: RE | Admit: 2021-06-13 | Discharge: 2021-06-13 | Disposition: A | Discharge status: Home / Self Care | Payer: Medicare Other | Source: Ambulatory Visit | Attending: Internal Medicine | Admitting: Internal Medicine

## 2021-06-13 DIAGNOSIS — M549 Dorsalgia, unspecified: Secondary | ICD-10-CM | POA: Insufficient documentation

## 2021-06-13 DIAGNOSIS — S22080A Wedge compression fracture of T11-T12 vertebra, initial encounter for closed fracture: Secondary | ICD-10-CM | POA: Diagnosis not present

## 2021-06-13 DIAGNOSIS — M545 Low back pain, unspecified: Secondary | ICD-10-CM | POA: Diagnosis not present

## 2021-06-13 DIAGNOSIS — S22070A Wedge compression fracture of T9-T10 vertebra, initial encounter for closed fracture: Secondary | ICD-10-CM | POA: Diagnosis not present

## 2021-06-14 ENCOUNTER — Telehealth: Payer: Self-pay

## 2021-06-14 NOTE — Telephone Encounter (Signed)
Patient's daughter called, please call her today at [phone]. Very concerned about xray results

## 2021-06-14 NOTE — Telephone Encounter (Signed)
Pt's daughter [name] called and wants results on back xrays bc pt is still in pain

## 2021-06-14 NOTE — Telephone Encounter (Signed)
.  please see result note.

## 2021-06-15 ENCOUNTER — Other Ambulatory Visit: Payer: Self-pay | Admitting: Internal Medicine

## 2021-06-15 DIAGNOSIS — M545 Low back pain, unspecified: Secondary | ICD-10-CM

## 2021-06-15 MED ORDER — MELOXICAM 7.5 MG PO TABS: 7.5000 mg | ORAL_TABLET | Freq: Every day | ORAL | 0 refills | Status: DC

## 2021-06-15 NOTE — Telephone Encounter (Signed)
Please see result note for what patients daughter is needing.

## 2021-06-15 NOTE — Telephone Encounter (Signed)
Patients daughter stated since his fall he has not had an appetite. Patient is also feeling very constipated. Appointment has been scheduled for tomorrow with Dr Caryl Bis.   Patient has also been informed of both messages.

## 2021-06-15 NOTE — Telephone Encounter (Signed)
Daughter is giving dulcolax for sx.

## 2021-06-15 NOTE — Telephone Encounter (Signed)
Patient's daughter called and really needs Dr Derrel Nip this morning. She states she has been waiting for Dr Derrel Nip to call her since yesterday.

## 2021-06-16 ENCOUNTER — Ambulatory Visit (INDEPENDENT_AMBULATORY_CARE_PROVIDER_SITE_OTHER): Payer: Medicare Other | Admitting: Family Medicine

## 2021-06-16 ENCOUNTER — Other Ambulatory Visit: Payer: Self-pay

## 2021-06-16 VITALS — BP 120/70 | HR 68 | Temp 98.5°F | Ht 68.0 in | Wt 134.8 lb

## 2021-06-16 DIAGNOSIS — S22000A Wedge compression fracture of unspecified thoracic vertebra, initial encounter for closed fracture: Secondary | ICD-10-CM

## 2021-06-16 DIAGNOSIS — R634 Abnormal weight loss: Secondary | ICD-10-CM

## 2021-06-16 DIAGNOSIS — K5900 Constipation, unspecified: Secondary | ICD-10-CM

## 2021-06-16 LAB — POCT URINALYSIS DIPSTICK
Bilirubin, UA: NEGATIVE
Blood, UA: NEGATIVE
Glucose, UA: NEGATIVE
Ketones, UA: NEGATIVE
Nitrite, UA: NEGATIVE
Protein, UA: NEGATIVE
Spec Grav, UA: 1.015 (ref 1.010–1.025)
Urobilinogen, UA: 1 E.U./dL
pH, UA: 7.5 (ref 5.0–8.0)

## 2021-06-16 LAB — CBC WITH DIFFERENTIAL/PLATELET
Basophils Absolute: 0.1 10*3/uL (ref 0.0–0.1)
Basophils Relative: 1.5 % (ref 0.0–3.0)
Eosinophils Absolute: 0.1 10*3/uL (ref 0.0–0.7)
Eosinophils Relative: 1.3 % (ref 0.0–5.0)
HCT: 35.6 % — ABNORMAL LOW (ref 39.0–52.0)
Hemoglobin: 12 g/dL — ABNORMAL LOW (ref 13.0–17.0)
Lymphocytes Relative: 11.6 % — ABNORMAL LOW (ref 12.0–46.0)
Lymphs Abs: 0.8 10*3/uL (ref 0.7–4.0)
MCHC: 33.8 g/dL (ref 30.0–36.0)
MCV: 101.5 fl — ABNORMAL HIGH (ref 78.0–100.0)
Monocytes Absolute: 0.8 10*3/uL (ref 0.1–1.0)
Monocytes Relative: 11.8 % (ref 3.0–12.0)
Neutro Abs: 5.2 10*3/uL (ref 1.4–7.7)
Neutrophils Relative %: 73.8 % (ref 43.0–77.0)
Platelets: 307 10*3/uL (ref 150.0–400.0)
RBC: 3.5 Mil/uL — ABNORMAL LOW (ref 4.22–5.81)
RDW: 15.5 % (ref 11.5–15.5)
WBC: 7 10*3/uL (ref 4.0–10.5)

## 2021-06-16 LAB — COMPREHENSIVE METABOLIC PANEL
ALT: 12 U/L (ref 0–53)
AST: 15 U/L (ref 0–37)
Albumin: 3.7 g/dL (ref 3.5–5.2)
Alkaline Phosphatase: 65 U/L (ref 39–117)
BUN: 29 mg/dL — ABNORMAL HIGH (ref 6–23)
CO2: 31 mEq/L (ref 19–32)
Calcium: 9 mg/dL (ref 8.4–10.5)
Chloride: 94 mEq/L — ABNORMAL LOW (ref 96–112)
Creatinine, Ser: 1.26 mg/dL (ref 0.40–1.50)
GFR: 47.59 mL/min — ABNORMAL LOW (ref >60.00)
Glucose, Bld: 112 mg/dL — ABNORMAL HIGH (ref 70–99)
Potassium: 3.7 mEq/L (ref 3.5–5.1)
Sodium: 134 mEq/L — ABNORMAL LOW (ref 135–145)
Total Bilirubin: 0.7 mg/dL (ref 0.2–1.2)
Total Protein: 6.7 g/dL (ref 6.0–8.3)

## 2021-06-16 LAB — TSH: TSH: 1.6 u[IU]/mL (ref 0.35–5.50)

## 2021-06-16 LAB — SEDIMENTATION RATE: Sed Rate: 44 mm/hr — ABNORMAL HIGH (ref 0–20)

## 2021-06-16 MED ORDER — TRAMADOL HCL 50 MG PO TABS: 50.0000 mg | ORAL_TABLET | Freq: Three times a day (TID) | ORAL | 0 refills | Status: AC | PRN

## 2021-06-16 MED ORDER — POLYETHYLENE GLYCOL 3350 17 GM/SCOOP PO POWD: 17.0000 g | Freq: Every day | ORAL | 0 refills | Status: DC | PRN

## 2021-06-16 NOTE — Assessment & Plan Note (Signed)
The patient's weight loss could be related to his decreased oral intake which could be related to his constipation or his pain from his back.  He has a reassuring exam today with the exception of the tenderness in his back.  We will obtain basic labs as outlined as a first step in evaluation for his weight loss.  We will contact the patient's daughter with these results.

## 2021-06-16 NOTE — Progress Notes (Signed)
Tommi Rumps, MD Phone: [phone]  Jason Powers is a 87 y.o. male who presents today for same-day visit for back pain and constipation.  The patient had a fall a couple of weeks ago.  He was in the bathroom and he fell backwards and hit his back on the wall.  He had no head injury.  No loss of consciousness.  He has had back pain since that fall.  He had no pain prior to the fall.  He notes no radiation to his legs.  No numbness.  His daughter notes he seems a little weaker in his legs since the fall.  No bowel or bladder incontinence.  He had an x-ray that revealed a compression fracture at T9 and T12.  They note he has been constipated since the fall.  He has only had 2 bowel movements in the last 2 weeks.  His appetite has been decreased since the fall.  He notes his stomach was hurting him until he had a good bowel movement 2 days ago.  No abdominal pain at this time.  He has had no blood in his stool.  No nausea or vomiting.  His weight has trended down 13 pounds since May.  They have been giving him Dulcolax and milk of magnesia.  He notes no chest pain, cough, congestion, shortness of breath, anxiety, night sweats, or itching.  No history of cancer.  No hematuria.  He notes some mild depression though nothing significant.  No SI.  He has trouble laying down on his back related to pain.  He was taking meloxicam though that was not beneficial.  No history of seizures.    Social History   Tobacco Use  Smoking Status Former   Types: Cigarettes   Quit date: 1964   Years since quitting: 58.6  Smokeless Tobacco Never    Current Outpatient Medications on File Prior to Visit  Medication Sig Dispense Refill   hydrochlorothiazide (HYDRODIURIL) 25 MG tablet TAKE 1 TABLET BY MOUTH ONCE A DAY 90 tablet 1   meloxicam (MOBIC) 7.5 MG tablet Take 1 tablet (7.5 mg total) by mouth daily. 30 tablet 0   aspirin EC 81 MG tablet Take 81 mg by mouth daily. (Patient not taking: Reported on 06/16/2021)      brinzolamide (AZOPT) 1 % ophthalmic suspension 1 drop 2 (two) times daily. (Patient not taking: Reported on 06/16/2021)     ferrous fumarate-iron polysaccharide complex (TANDEM) 162-115.2 MG CAPS capsule Take 1 capsule by mouth daily with breakfast. (Patient not taking: Reported on 06/16/2021) 30 capsule 2   Travoprost, BAK Free, (TRAVATAN) 0.004 % SOLN ophthalmic solution 1 drop at bedtime.     No current facility-administered medications on file prior to visit.     ROS see history of present illness  Objective  Physical Exam Vitals:   06/16/21 1324  BP: 120/70  Pulse: 68  Temp: 98.5 F (36.9 C)  SpO2: 92%    BP Readings from Last 3 Encounters:  06/16/21 120/70  06/07/21 (!) 130/59  03/21/21 140/60   Wt Readings from Last 3 Encounters:  06/16/21 134 lb 12.8 oz (61.1 kg)  03/21/21 147 lb 12.8 oz (67 kg)  08/29/20 146 lb (66.2 kg)    Physical Exam Constitutional:      General: He is not in acute distress.    Appearance: He is not diaphoretic.  Cardiovascular:     Rate and Rhythm: Normal rate and regular rhythm.     Heart sounds: Normal heart sounds.  Pulmonary:     Effort: Pulmonary effort is normal.     Breath sounds: Normal breath sounds.  Chest:  Breasts:    Right: No axillary adenopathy.     Left: No axillary adenopathy.  Abdominal:     General: Bowel sounds are normal. There is no distension.     Palpations: Abdomen is soft.     Tenderness: There is no abdominal tenderness. There is no guarding or rebound.  Musculoskeletal:     Comments: There is lower thoracic spine tenderness with no step-off noted, there is a bruise over his right posterior shoulder that appears mild and is nontender  Lymphadenopathy:     Head:     Right side of head: No submental or submandibular adenopathy.     Left side of head: No submental or submandibular adenopathy.     Cervical: No cervical adenopathy.     Upper Body:     Right upper body: No axillary adenopathy.     Left  upper body: No axillary adenopathy.  Skin:    General: Skin is warm and dry.  Neurological:     Mental Status: He is alert.     Comments: 5/5 strength bilateral quads, hamstrings, plantar flexion, and dorsiflexion, sensation light touch bilateral lower extremities, absent patellar reflexes bilaterally     Assessment/Plan: Please see individual problem list.  Problem List Items Addressed This Visit     Compression fracture of thoracic vertebra ([place]) - Primary    His level of discomfort would seem to indicate that these are acute.  We will get an MRI thoracic spine to evaluate further particularly given the reported weakness on standing.  His lower extremities seem to be neurologically stable today.  Discussed trial of tramadol for pain control.  Advised that somebody should stay with him the first time he takes this to see how he responds.  Discussed risk of drowsiness with this.  I also discussed the role of physical therapy for his back though advised that the health soon he would be able to do this would depend on how acute the fractures are on MRI.       Relevant Medications   traMADol (ULTRAM) 50 MG tablet   Other Relevant Orders   MR Thoracic Spine Wo Contrast   Constipation    Recently has had a couple good bowel movements.  Discussed a trial of daily MiraLAX.  If this is not beneficial over the next 3 to 4 days they can increase this to twice daily.  If he has any nausea or other side effects with this medicine they will let us know and we could trial an alternative medicine for his constipation.  Certainly his constipation could be contributing to his lack of an appetite.       Relevant Medications   polyethylene glycol powder (GLYCOLAX/MIRALAX) 17 GM/SCOOP powder   Weight loss    The patient's weight loss could be related to his decreased oral intake which could be related to his constipation or his pain from his back.  He has a reassuring exam today with the exception of the  tenderness in his back.  We will obtain basic labs as outlined as a first step in evaluation for his weight loss.  We will contact the patient's daughter with these results.       Relevant Orders   Comp Met (CMET)   TSH   CBC w/Diff   Sedimentation rate   POCT Urinalysis Dipstick    Return  in about 3 weeks (around 07/07/2021) for PCP or Chella Chapdelaine for follow-up on back pain and ambulate.  This visit occurred during the SARS-CoV-2 public health emergency.  Safety protocols were in place, including screening questions prior to the visit, additional usage of staff PPE, and extensive cleaning of exam room while observing appropriate contact time as indicated for disinfecting solutions.   I have spent 45 minutes in the care of this patient regarding history taking, documentation, review of recent phone messages and urgent care visit and x-ray results, discussion of plan, completion of exam, placing orders.   Tommi Rumps, MD [place]

## 2021-06-16 NOTE — Assessment & Plan Note (Addendum)
Recently has had a couple good bowel movements.  Discussed a trial of daily MiraLAX.  If this is not beneficial over the next 3 to 4 days they can increase this to twice daily.  If he has any nausea or other side effects with this medicine they will let us know and we could trial an alternative medicine for his constipation.  Certainly his constipation could be contributing to his lack of an appetite.

## 2021-06-16 NOTE — Assessment & Plan Note (Addendum)
His level of discomfort would seem to indicate that these are acute.  We will get an MRI thoracic spine to evaluate further particularly given the reported weakness on standing.  His lower extremities seem to be neurologically stable today.  Discussed trial of tramadol for pain control.  Advised that somebody should stay with him the first time he takes this to see how he responds.  Discussed risk of drowsiness with this.  I also discussed the role of physical therapy for his back though advised that the health soon he would be able to do this would depend on how acute the fractures are on MRI.

## 2021-06-16 NOTE — Patient Instructions (Signed)
Nice to meet you. Somebody will call you to schedule your MRI. You can try the tramadol for your pain.  This may make you drowsy.  I would advise having somebody stay with you the first time you take this. Please try the MiraLAX once daily to help with bowel movements.  If this is not beneficial after 3 to 4 days you could increase to twice daily. We will get lab work today to evaluate for cause of your recent weight loss.

## 2021-06-19 ENCOUNTER — Other Ambulatory Visit: Payer: Self-pay | Admitting: Family Medicine

## 2021-06-19 DIAGNOSIS — D539 Nutritional anemia, unspecified: Secondary | ICD-10-CM

## 2021-06-28 ENCOUNTER — Other Ambulatory Visit: Payer: Self-pay

## 2021-06-28 ENCOUNTER — Ambulatory Visit
Admission: RE | Admit: 2021-06-28 | Discharge: 2021-06-28 | Disposition: A | Discharge status: Home / Self Care | Payer: Medicare Other | Source: Ambulatory Visit | Attending: Family Medicine | Admitting: Family Medicine

## 2021-06-28 DIAGNOSIS — S22080A Wedge compression fracture of T11-T12 vertebra, initial encounter for closed fracture: Secondary | ICD-10-CM | POA: Diagnosis not present

## 2021-06-28 DIAGNOSIS — S22070A Wedge compression fracture of T9-T10 vertebra, initial encounter for closed fracture: Secondary | ICD-10-CM | POA: Diagnosis not present

## 2021-06-28 DIAGNOSIS — S22000A Wedge compression fracture of unspecified thoracic vertebra, initial encounter for closed fracture: Secondary | ICD-10-CM | POA: Diagnosis not present

## 2021-06-28 DIAGNOSIS — M40204 Unspecified kyphosis, thoracic region: Secondary | ICD-10-CM | POA: Diagnosis not present

## 2021-06-28 DIAGNOSIS — K5730 Diverticulosis of large intestine without perforation or abscess without bleeding: Secondary | ICD-10-CM | POA: Diagnosis not present

## 2021-06-30 DIAGNOSIS — S22010A Wedge compression fracture of first thoracic vertebra, initial encounter for closed fracture: Secondary | ICD-10-CM | POA: Diagnosis not present

## 2021-06-30 NOTE — Addendum Note (Signed)
Addended by: Leone Haven on: 06/30/2021 03:22 PM   Modules accepted: Orders

## 2021-06-30 NOTE — Progress Notes (Signed)
His MRI returned with a T12 compression fracture with retropulsion of 6 mm indenting the spinal cord with no apparent edema.  I have communicated with Dr Izora Ribas regarding this.  He is going to try to get the patient into clinic to be seen for this relatively quickly.  Please see the result note for the MRI regarding this discussion.  Referral to neurosurgery placed.

## 2021-07-04 DIAGNOSIS — S22000D Wedge compression fracture of unspecified thoracic vertebra, subsequent encounter for fracture with routine healing: Secondary | ICD-10-CM | POA: Diagnosis not present

## 2021-07-05 ENCOUNTER — Other Ambulatory Visit (INDEPENDENT_AMBULATORY_CARE_PROVIDER_SITE_OTHER): Payer: Medicare Other

## 2021-07-05 ENCOUNTER — Encounter: Payer: Self-pay | Admitting: Podiatry

## 2021-07-05 ENCOUNTER — Other Ambulatory Visit: Payer: Self-pay

## 2021-07-05 ENCOUNTER — Ambulatory Visit: Payer: Medicare Other | Admitting: Podiatry

## 2021-07-05 DIAGNOSIS — B351 Tinea unguium: Secondary | ICD-10-CM | POA: Diagnosis not present

## 2021-07-05 DIAGNOSIS — D539 Nutritional anemia, unspecified: Secondary | ICD-10-CM | POA: Diagnosis not present

## 2021-07-05 DIAGNOSIS — M79676 Pain in unspecified toe(s): Secondary | ICD-10-CM | POA: Diagnosis not present

## 2021-07-05 LAB — FOLATE: Folate: 14.3 ng/mL

## 2021-07-05 LAB — VITAMIN B12: Vitamin B-12: 613 pg/mL (ref 211–911)

## 2021-07-05 NOTE — Progress Notes (Signed)
He presents today chief complaint of painful elongated toenails.  Objective: Toenails are long thick yellow dystrophic-like mycotic.  Assessment: Pain limb secondary to onychomycosis.  Plan: Debridement of toenails 1 through 5 bilateral.

## 2021-07-18 ENCOUNTER — Other Ambulatory Visit: Payer: Self-pay

## 2021-07-18 ENCOUNTER — Encounter: Payer: Self-pay | Admitting: Family Medicine

## 2021-07-18 ENCOUNTER — Ambulatory Visit (INDEPENDENT_AMBULATORY_CARE_PROVIDER_SITE_OTHER): Payer: Medicare Other | Admitting: Family Medicine

## 2021-07-18 VITALS — BP 118/70 | HR 79 | Temp 97.7°F | Ht 68.0 in | Wt 129.6 lb

## 2021-07-18 DIAGNOSIS — R634 Abnormal weight loss: Secondary | ICD-10-CM | POA: Diagnosis not present

## 2021-07-18 DIAGNOSIS — N400 Enlarged prostate without lower urinary tract symptoms: Secondary | ICD-10-CM | POA: Insufficient documentation

## 2021-07-18 DIAGNOSIS — S22000D Wedge compression fracture of unspecified thoracic vertebra, subsequent encounter for fracture with routine healing: Secondary | ICD-10-CM | POA: Diagnosis not present

## 2021-07-18 DIAGNOSIS — N401 Enlarged prostate with lower urinary tract symptoms: Secondary | ICD-10-CM | POA: Diagnosis not present

## 2021-07-18 DIAGNOSIS — D539 Nutritional anemia, unspecified: Secondary | ICD-10-CM | POA: Diagnosis not present

## 2021-07-18 DIAGNOSIS — N3941 Urge incontinence: Secondary | ICD-10-CM | POA: Insufficient documentation

## 2021-07-18 DIAGNOSIS — Z23 Encounter for immunization: Secondary | ICD-10-CM

## 2021-07-18 DIAGNOSIS — R351 Nocturia: Secondary | ICD-10-CM

## 2021-07-18 LAB — PSA: PSA: 0.55 ng/mL (ref 0.10–4.00)

## 2021-07-18 LAB — CBC WITH DIFFERENTIAL/PLATELET
Basophils Absolute: 0.1 10*3/uL (ref 0.0–0.1)
Basophils Relative: 1.3 % (ref 0.0–3.0)
Eosinophils Absolute: 0.1 10*3/uL (ref 0.0–0.7)
Eosinophils Relative: 1.5 % (ref 0.0–5.0)
HCT: 36.7 % — ABNORMAL LOW (ref 39.0–52.0)
Hemoglobin: 11.9 g/dL — ABNORMAL LOW (ref 13.0–17.0)
Lymphocytes Relative: 12.6 % (ref 12.0–46.0)
Lymphs Abs: 0.8 10*3/uL (ref 0.7–4.0)
MCHC: 32.3 g/dL (ref 30.0–36.0)
MCV: 104.1 fl — ABNORMAL HIGH (ref 78.0–100.0)
Monocytes Absolute: 0.7 10*3/uL (ref 0.1–1.0)
Monocytes Relative: 11 % (ref 3.0–12.0)
Neutro Abs: 4.6 10*3/uL (ref 1.4–7.7)
Neutrophils Relative %: 73.6 % (ref 43.0–77.0)
Platelets: 251 10*3/uL (ref 150.0–400.0)
RBC: 3.52 Mil/uL — ABNORMAL LOW (ref 4.22–5.81)
RDW: 15.7 % — ABNORMAL HIGH (ref 11.5–15.5)
WBC: 6.2 10*3/uL (ref 4.0–10.5)

## 2021-07-18 MED ORDER — TAMSULOSIN HCL 0.4 MG PO CAPS: 0.4000 mg | ORAL_CAPSULE | Freq: Every day | ORAL | 3 refills | Status: DC

## 2021-07-18 NOTE — Progress Notes (Signed)
Tommi Rumps, MD Phone: [phone]  Jason Powers is a 87 y.o. male who presents today for f/u.  BPH: reports ongoing prostate issues Strain- yes Flow- decreased Frequency- increased Urgency- yes Nocturia- 3-4x Dysuria- no Emptying bladder- incomplete Medication- none  Compression fracture: This is in his thoracic spine.  He saw neurosurgery and he has been in a brace.  They plan for physical therapy starting later this month.  He notes the pain is resolved.  His daughter reports he is getting up and down much better than he was previously.  No numbness, weakness, or incontinence.  He had been taking tramadol nightly for his pain and to help him sleep though he reports no pain at night now.  Macrocytosis: Found on recent labs.  B12 and folate were normal.  Weight loss: He has noted some weight loss since his fall and compression fracture.  He does eat lots of vegetables.  He also has hotdogs, biscuits with ham, and exam which is.  He eats 2 meals a day.  He is not doing any boost or Ensure.  He has not been as active as he was prior to the compression fracture.  He notes no depression.  Social History   Tobacco Use  Smoking Status Former   Types: Cigarettes   Quit date: 1964   Years since quitting: 58.7  Smokeless Tobacco Never    Current Outpatient Medications on File Prior to Visit  Medication Sig Dispense Refill   Acetaminophen 325 MG CAPS Take by mouth.     brinzolamide (AZOPT) 1 % ophthalmic suspension 1 drop 2 (two) times daily.     ferrous fumarate-iron polysaccharide complex (TANDEM) 162-115.2 MG CAPS capsule Take 1 capsule by mouth daily with breakfast. 30 capsule 2   hydrochlorothiazide (HYDRODIURIL) 25 MG tablet TAKE 1 TABLET BY MOUTH ONCE A DAY 90 tablet 1   magnesium hydroxide (MILK OF MAGNESIA) 400 MG/5ML suspension Take by mouth daily as needed for mild constipation.     meloxicam (MOBIC) 7.5 MG tablet Take 1 tablet (7.5 mg total) by mouth daily. 30  tablet 0   aspirin EC 81 MG tablet Take 81 mg by mouth daily. (Patient not taking: Reported on 07/18/2021)     polyethylene glycol powder (GLYCOLAX/MIRALAX) 17 GM/SCOOP powder Take 17 g by mouth daily as needed for mild constipation. (Patient not taking: Reported on 07/18/2021) 500 g 0   Travoprost, BAK Free, (TRAVATAN) 0.004 % SOLN ophthalmic solution 1 drop at bedtime.     No current facility-administered medications on file prior to visit.     ROS see history of present illness  Objective  Physical Exam Vitals:   07/18/21 1031  BP: 118/70  Pulse: 79  Temp: 97.7 F (36.5 C)  SpO2: 97%    BP Readings from Last 3 Encounters:  07/18/21 118/70  06/16/21 120/70  06/07/21 (!) 130/59   Wt Readings from Last 3 Encounters:  07/18/21 129 lb 9.6 oz (58.8 kg)  06/16/21 134 lb 12.8 oz (61.1 kg)  03/21/21 147 lb 12.8 oz (67 kg)    Physical Exam Constitutional:      General: He is not in acute distress.    Appearance: He is not diaphoretic.  Cardiovascular:     Rate and Rhythm: Normal rate and regular rhythm.     Heart sounds: Normal heart sounds.  Pulmonary:     Effort: Pulmonary effort is normal.     Breath sounds: Normal breath sounds.  Genitourinary:    Comments: Prostate is  significantly enlarged, no nodules Skin:    General: Skin is warm and dry.  Neurological:     Mental Status: He is alert.     Assessment/Plan: Please see individual problem list.  Problem List Items Addressed This Visit     BPH (benign prostatic hyperplasia)    Prostate is enlarged on exam.  Symptoms fit with BPH.  We will start on Flomax.  I discussed the risk of orthostatic hypotension with this.  They will contact us if he gets lightheaded with this.  We will also check a PSA.      Relevant Medications   tamsulosin (FLOMAX) 0.4 MG CAPS capsule   Other Relevant Orders   PSA   Compression fracture of thoracic vertebra ([facility])    Pain has resolved.  He will continue the brace through  neurosurgery.  He will follow-up with neurosurgery as planned.  Discussed if he is having discomfort he could take tramadol at night though if he is not having discomfort he should not be taking this.  Bone density scan ordered.  They will call to schedule.      Relevant Orders   DG Bone Density   Macrocytic anemia    Recheck today and if there are still abnormalities he will be referred to hematology.      Relevant Orders   CBC w/Diff   Weight loss    Weight has continued to trend down.  This still potentially could be related to an appetite issue from his back discomfort though his pain has improved significantly.  He has had some macrocytosis with anemia and certainly there could be some hematologic issue that is contributing.  We will recheck that and then likely refer to hematology.  I encouraged them to add in boost or Ensure twice daily to help increase his caloric intake.  He will follow-up in 1 month with his PCP for a weight check.       Return in about 4 weeks (around 08/15/2021) for PCP for weight loss follow-up.  This visit occurred during the SARS-CoV-2 public health emergency.  Safety protocols were in place, including screening questions prior to the visit, additional usage of staff PPE, and extensive cleaning of exam room while observing appropriate contact time as indicated for disinfecting solutions.    Tommi Rumps, MD [place]

## 2021-07-18 NOTE — Assessment & Plan Note (Addendum)
Pain has resolved.  He will continue the brace through neurosurgery.  He will follow-up with neurosurgery as planned.  Discussed if he is having discomfort he could take tramadol at night though if he is not having discomfort he should not be taking this.  Bone density scan ordered.  They will call to schedule.

## 2021-07-18 NOTE — Assessment & Plan Note (Signed)
Recheck today and if there are still abnormalities he will be referred to hematology.

## 2021-07-18 NOTE — Assessment & Plan Note (Signed)
Prostate is enlarged on exam.  Symptoms fit with BPH.  We will start on Flomax.  I discussed the risk of orthostatic hypotension with this.  They will contact us if he gets lightheaded with this.  We will also check a PSA.

## 2021-07-18 NOTE — Patient Instructions (Addendum)
Nice to see you. We will recheck lab work today and then likely have you see hematology. Please try to get a boost or Ensure twice daily between your meals. Please call [phone] to schedule your bone density scan. Please only take the tramadol if you really need it for pain.

## 2021-07-18 NOTE — Assessment & Plan Note (Addendum)
Weight has continued to trend down.  This still potentially could have been related to an appetite issue from his back discomfort though his pain has improved significantly.  He has had some macrocytosis with anemia and certainly there could be some hematologic issue that is contributing.  He has been less active so muscle was could be playing a role as well.  We will recheck that and then likely refer to hematology.  I encouraged them to add in boost or Ensure twice daily to help increase his caloric intake.  He will follow-up in 1 month with his PCP for a weight check.

## 2021-07-24 ENCOUNTER — Other Ambulatory Visit: Payer: Self-pay | Admitting: Family Medicine

## 2021-07-24 ENCOUNTER — Ambulatory Visit (INDEPENDENT_AMBULATORY_CARE_PROVIDER_SITE_OTHER): Payer: Medicare Other

## 2021-07-24 ENCOUNTER — Telehealth: Payer: Self-pay | Admitting: Internal Medicine

## 2021-07-24 VITALS — Ht 68.0 in | Wt 129.0 lb

## 2021-07-24 DIAGNOSIS — D539 Nutritional anemia, unspecified: Secondary | ICD-10-CM

## 2021-07-24 DIAGNOSIS — Z0000 Encounter for general adult medical examination without abnormal findings: Secondary | ICD-10-CM | POA: Diagnosis not present

## 2021-07-24 DIAGNOSIS — R32 Unspecified urinary incontinence: Secondary | ICD-10-CM

## 2021-07-24 NOTE — Telephone Encounter (Signed)
Spoke with pt's daughter she stated that she will be by here tomorrow to pick up the specimen cups and bring back in the afternoon. Aware of the video visit on Wednesday.

## 2021-07-24 NOTE — Patient Instructions (Addendum)
Mr. Jason Powers , Thank you for taking time to come for your Medicare Wellness Visit. I appreciate your ongoing commitment to your health goals. Please review the following plan we discussed and let me know if I can assist you in the future.   These are the goals we discussed:  Goals       Patient Stated     Maintain Healthy Lifestyle (pt-stated)      Stay active Healthy diet/hydration        This is a list of the screening recommended for you and due dates:  Health Maintenance  Topic Date Due   Tetanus Vaccine  Never done   Zoster (Shingles) Vaccine (1 of 2) Never done   COVID-19 Vaccine (4 - Booster for Pfizer series) 05/14/2021   Pneumonia vaccines (2 of 2 - PPSV23) 08/29/2021   Flu Shot  Completed   HPV Vaccine  Aged Out    Advanced directives: End of life planning; Advance aging; Advanced directives discussed.  Copy of current HCPOA/Living Will requested.    Conditions/risks identified: none new  Next appointment: Follow up in one year for your annual wellness visit.   Preventive Care 28 Years and Older, Male Preventive care refers to lifestyle choices and visits with your health care provider that can promote health and wellness. What does preventive care include? A yearly physical exam. This is also called an annual well check. Dental exams once or twice a year. Routine eye exams. Ask your health care provider how often you should have your eyes checked. Personal lifestyle choices, including: Daily care of your teeth and gums. Regular physical activity. Eating a healthy diet. Avoiding tobacco and drug use. Limiting alcohol use. Practicing safe sex. Taking low doses of aspirin every day. Taking vitamin and mineral supplements as recommended by your health care provider. What happens during an annual well check? The services and screenings done by your health care provider during your annual well check will depend on your age, overall health, lifestyle risk factors,  and family history of disease. Counseling  Your health care provider may ask you questions about your: Alcohol use. Tobacco use. Drug use. Emotional well-being. Home and relationship well-being. Sexual activity. Eating habits. History of falls. Memory and ability to understand (cognition). Work and work Statistician. Screening  You may have the following tests or measurements: Height, weight, and BMI. Blood pressure. Lipid and cholesterol levels. These may be checked every 5 years, or more frequently if you are over 66 years old. Skin check. Lung cancer screening. You may have this screening every year starting at age 30 if you have a 30-pack-year history of smoking and currently smoke or have quit within the past 15 years. Fecal occult blood test (FOBT) of the stool. You may have this test every year starting at age 54. Flexible sigmoidoscopy or colonoscopy. You may have a sigmoidoscopy every 5 years or a colonoscopy every 10 years starting at age 90. Prostate cancer screening. Recommendations will vary depending on your family history and other risks. Hepatitis C blood test. Hepatitis B blood test. Sexually transmitted disease (STD) testing. Diabetes screening. This is done by checking your blood sugar (glucose) after you have not eaten for a while (fasting). You may have this done every 1-3 years. Abdominal aortic aneurysm (AAA) screening. You may need this if you are a current or former smoker. Osteoporosis. You may be screened starting at age 28 if you are at high risk. Talk with your health care provider about your  test results, treatment options, and if necessary, the need for more tests. Vaccines  Your health care provider may recommend certain vaccines, such as: Influenza vaccine. This is recommended every year. Tetanus, diphtheria, and acellular pertussis (Tdap, Td) vaccine. You may need a Td booster every 10 years. Zoster vaccine. You may need this after age  25. Pneumococcal 13-valent conjugate (PCV13) vaccine. One dose is recommended after age 66. Pneumococcal polysaccharide (PPSV23) vaccine. One dose is recommended after age 19. Talk to your health care provider about which screenings and vaccines you need and how often you need them. This information is not intended to replace advice given to you by your health care provider. Make sure you discuss any questions you have with your health care provider. Document Released: 11/25/2015 Document Revised: 07/18/2016 Document Reviewed: 08/30/2015 Elsevier Interactive Patient Education  2017 [place] Prevention in the Home Falls can cause injuries. They can happen to people of all ages. There are many things you can do to make your home safe and to help prevent falls. What can I do on the outside of my home? Regularly fix the edges of walkways and driveways and fix any cracks. Remove anything that might make you trip as you walk through a door, such as a raised step or threshold. Trim any bushes or trees on the path to your home. Use bright outdoor lighting. Clear any walking paths of anything that might make someone trip, such as rocks or tools. Regularly check to see if handrails are loose or broken. Make sure that both sides of any steps have handrails. Any raised decks and porches should have guardrails on the edges. Have any leaves, snow, or ice cleared regularly. Use sand or salt on walking paths during winter. Clean up any spills in your garage right away. This includes oil or grease spills. What can I do in the bathroom? Use night lights. Install grab bars by the toilet and in the tub and shower. Do not use towel bars as grab bars. Use non-skid mats or decals in the tub or shower. If you need to sit down in the shower, use a plastic, non-slip stool. Keep the floor dry. Clean up any water that spills on the floor as soon as it happens. Remove soap buildup in the tub or shower  regularly. Attach bath mats securely with double-sided non-slip rug tape. Do not have throw rugs and other things on the floor that can make you trip. What can I do in the bedroom? Use night lights. Make sure that you have a light by your bed that is easy to reach. Do not use any sheets or blankets that are too big for your bed. They should not hang down onto the floor. Have a firm chair that has side arms. You can use this for support while you get dressed. Do not have throw rugs and other things on the floor that can make you trip. What can I do in the kitchen? Clean up any spills right away. Avoid walking on wet floors. Keep items that you use a lot in easy-to-reach places. If you need to reach something above you, use a strong step stool that has a grab bar. Keep electrical cords out of the way. Do not use floor polish or wax that makes floors slippery. If you must use wax, use non-skid floor wax. Do not have throw rugs and other things on the floor that can make you trip. What can I do with my  stairs? Do not leave any items on the stairs. Make sure that there are handrails on both sides of the stairs and use them. Fix handrails that are broken or loose. Make sure that handrails are as long as the stairways. Check any carpeting to make sure that it is firmly attached to the stairs. Fix any carpet that is loose or worn. Avoid having throw rugs at the top or bottom of the stairs. If you do have throw rugs, attach them to the floor with carpet tape. Make sure that you have a light switch at the top of the stairs and the bottom of the stairs. If you do not have them, ask someone to add them for you. What else can I do to help prevent falls? Wear shoes that: Do not have high heels. Have rubber bottoms. Are comfortable and fit you well. Are closed at the toe. Do not wear sandals. If you use a stepladder: Make sure that it is fully opened. Do not climb a closed stepladder. Make sure that  both sides of the stepladder are locked into place. Ask someone to hold it for you, if possible. Clearly mark and make sure that you can see: Any grab bars or handrails. First and last steps. Where the edge of each step is. Use tools that help you move around (mobility aids) if they are needed. These include: Canes. Walkers. Scooters. Crutches. Turn on the lights when you go into a dark area. Replace any light bulbs as soon as they burn out. Set up your furniture so you have a clear path. Avoid moving your furniture around. If any of your floors are uneven, fix them. If there are any pets around you, be aware of where they are. Review your medicines with your doctor. Some medicines can make you feel dizzy. This can increase your chance of falling. Ask your doctor what other things that you can do to help prevent falls. This information is not intended to replace advice given to you by your health care provider. Make sure you discuss any questions you have with your health care provider. Document Released: 08/25/2009 Document Revised: 04/05/2016 Document Reviewed: 12/03/2014 Elsevier Interactive Patient Education  2017 [employer].

## 2021-07-24 NOTE — Telephone Encounter (Signed)
Patient called in stating that her father is taking tamsulosin (FLOMAX) 0.4 MG CAPS capsule for incontinence and its not working.Please advise.

## 2021-07-24 NOTE — Progress Notes (Addendum)
Subjective:   Jason Powers is a 87 y.o. male who presents for Medicare Annual/Subsequent preventive examination.  Review of Systems    No ROS.  Medicare Wellness Virtual Visit.  Visual/audio telehealth visit, UTA vital signs.   See social history for additional risk factors.   Cardiac Risk Factors include: advanced age (>47mn, >>1women);male gender     Objective:    Today's Vitals   07/24/21 0927  Weight: 129 lb (58.5 kg)  Height: '5\' 8"'$  (1.727 m)   Body mass index is 19.61 kg/m.  Advanced Directives 07/24/2021 05/23/2020  Does Patient Have a Medical Advance Directive? Yes No  Type of AParamedicof AManeleLiving will -  Does patient want to make changes to medical advance directive? No - Patient declined -  Copy of [place] Chart? No - copy requested -  Would patient like information on creating a medical advance directive? - Yes (MAU/Ambulatory/Procedural Areas - Information given)    Current Medications (verified) Outpatient Encounter Medications as of 07/24/2021  Medication Sig   Acetaminophen 325 MG CAPS Take by mouth.   aspirin EC 81 MG tablet Take 81 mg by mouth daily. (Patient not taking: Reported on 07/18/2021)   brinzolamide (AZOPT) 1 % ophthalmic suspension 1 drop 2 (two) times daily.   ferrous fumarate-iron polysaccharide complex (TANDEM) 162-115.2 MG CAPS capsule Take 1 capsule by mouth daily with breakfast.   hydrochlorothiazide (HYDRODIURIL) 25 MG tablet TAKE 1 TABLET BY MOUTH ONCE A DAY   magnesium hydroxide (MILK OF MAGNESIA) 400 MG/5ML suspension Take by mouth daily as needed for mild constipation.   tamsulosin (FLOMAX) 0.4 MG CAPS capsule Take 1 capsule (0.4 mg total) by mouth daily.   [DISCONTINUED] meloxicam (MOBIC) 7.5 MG tablet Take 1 tablet (7.5 mg total) by mouth daily.   [DISCONTINUED] polyethylene glycol powder (GLYCOLAX/MIRALAX) 17 GM/SCOOP powder Take 17 g by mouth daily as needed for mild  constipation. (Patient not taking: Reported on 07/18/2021)   [DISCONTINUED] Travoprost, BAK Free, (TRAVATAN) 0.004 % SOLN ophthalmic solution 1 drop at bedtime.   No facility-administered encounter medications on file as of 07/24/2021.    Allergies (verified) Patient has no known allergies.   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 No pertinent surgical history. Family History  Problem Relation Age of Onset   Drug abuse Brother    Cancer Brother    Social History   Socioeconomic History   Marital status: Widowed    Spouse name: Not on file   Number of children: Not on file   Years of education: Not on file   Highest education level: Not on file  Occupational History   Not on file  Tobacco Use   Smoking status: Former    Types: Cigarettes    Quit date: 1964    Years since quitting: 58.7   Smokeless tobacco: Never  Vaping Use   Vaping Use: Never used  Substance and Sexual Activity   Alcohol use: No   Drug use: No   Sexual activity: Not Currently  Other Topics Concern   Not on file  Social History Narrative   Not on file   Social Determinants of Health   Financial Resource Strain: Low Risk    Difficulty of Paying Living Expenses: Not hard at all  Food Insecurity: No Food Insecurity   Worried About RCharity fundraiserin  the Last Year: Never true   Ran Out of Food in the Last Year: Never true  Transportation Needs: No Transportation Needs   Lack of Transportation (Medical): No   Lack of Transportation (Non-Medical): No  Physical Activity: Not on file  Stress: No Stress Concern Present   Feeling of Stress : Not at all  Social Connections: Unknown   Frequency of Communication with Friends and Family: More than three times a week   Frequency of Social Gatherings with Friends and Family: More than three times a week   Attends Religious Services: Not on Electrical engineer  or Organizations: Not on file   Attends Archivist Meetings: Not on file   Marital Status: Not on file    Tobacco Counseling Counseling given: Not Answered   Clinical Intake:  Pre-visit preparation completed: Yes        Diabetes: No  How often do you need to have someone help you when you read instructions, pamphlets, or other written materials from your doctor or pharmacy?: 3 - Sometimes  Interpreter Needed?: No     Activities of Daily Living In your present state of health, do you have any difficulty performing the following activities: 07/24/2021  Hearing? N  Vision? N  Difficulty concentrating or making decisions? N  Walking or climbing stairs? Y  Comment Recovering from recent fall. Walker/cane in use when ambulating.  Dressing or bathing? N  Doing errands, shopping? Y  Comment Does not currently drive or run errands alone.  Preparing Food and eating ? N  Using the Toilet? N  In the past six months, have you accidently leaked urine? Y  Comment Followed by Urology and pcp  Do you have problems with loss of bowel control? N  Managing your Medications? Y  Comment Family assist  Managing your Finances? Y  Comment Family assist  Housekeeping or managing your Housekeeping? Y  Comment Family assist  Some recent data might be hidden    Patient Care Team: Crecencio Mc, MD as PCP - General (Internal Medicine)  Indicate any recent Medical Services you may have received from other than [facility] providers in the past year (date may be approximate).     Assessment:   This is a routine wellness examination for Jason Powers.  I connected with [place] today by telephone and verified that I am speaking with the correct person using two identifiers. Location patient: home Location provider: work Persons participating in the virtual visit: patient, daughter and Marine scientist.    I discussed the limitations, risks, security and privacy concerns of performing an evaluation  and management service by telephone and the availability of in person appointments. The patient expressed understanding and verbally consented to this telephonic visit.    Interactive audio and video telecommunications were attempted between this provider and patient, however failed, due to patient having technical difficulties OR patient did not have access to video capability.  We continued and completed visit with audio only.  Some vital signs may be absent or patient reported.   Hearing/Vision screen Hearing Screening - Comments:: Patient is able to hear conversational tones without difficulty. No issues reported.  Vision Screening - Comments:: They have seen their ophthalmologist in the last 12 months.    Dietary issues and exercise activities discussed: Current Exercise Habits: Home exercise routine, Type of exercise: walking, Intensity: Mild Regular diet Boost twice daily Good water intake   Goals Addressed  This Visit's Progress     Patient Stated     Maintain Healthy Lifestyle (pt-stated)        Stay active Healthy diet/hydration       Depression Screen PHQ 2/9 Scores 07/24/2021 06/16/2021 05/23/2020 02/26/2020  PHQ - 2 Score 0 0 0 0    Fall Risk Fall Risk  07/24/2021 07/18/2021 03/21/2021 08/29/2020 07/15/2020  Falls in the past year? 1 1 0 0 0  Number falls in past yr: - 0 - - -  Injury with Fall? - 1 - - -  Follow up - Falls evaluation completed Falls evaluation completed Falls evaluation completed Falls evaluation completed    [place]: Adequate lighting in your home to reduce risk of falls? Yes   ASSISTIVE DEVICES UTILIZED TO PREVENT FALLS:  Life alert? No  Use of a cane, walker or w/c? Yes  Grab bars in the bathroom? Yes  Shower chair or bench in shower? Yes   TIMED UP AND GO: Was the test performed? No .   Cognitive Function:  Patient is alert and oriented x3.    6CIT Screen 07/24/2021 05/23/2020  What  Year? 0 points 0 points  What month? 0 points -  What time? 0 points -    Immunizations Immunization History  Administered Date(s) Administered   Fluad Quad(high Dose 65+) 07/18/2021   Influenza, High Dose Seasonal PF 07/28/2019, 08/29/2020   PFIZER(Purple Top)SARS-COV-2 Vaccination 12/26/2019, 01/23/2020, 01/12/2021   Pneumococcal Conjugate-13 08/29/2020    TDAP status: Due, Education has been provided regarding the importance of this vaccine. Advised may receive this vaccine at local pharmacy or Health Dept. Aware to provide a copy of the vaccination record if obtained from local pharmacy or Health Dept. Verbalized acceptance and understanding. Deferred.   Shingles vaccine- Due, Education has been provided regarding the importance of this vaccine. Advised may receive this vaccine at local pharmacy or Health Dept. Aware to provide a copy of the vaccination record if obtained from local pharmacy or Health Dept. Verbalized acceptance and understanding. Deferred.   Health Maintenance Health Maintenance  Topic Date Due   TETANUS/TDAP  Never done   Zoster Vaccines- Shingrix (1 of 2) Never done   COVID-19 Vaccine (4 - Booster for Pfizer series) 05/14/2021   PNA vac Low Risk Adult (2 of 2 - PPSV23) 08/29/2021   INFLUENZA VACCINE  Completed   HPV VACCINES  Aged Out   Colorectal cancer screening: No longer required.   Lung Cancer Screening: (Low Dose CT Chest recommended if Age 40-80 years, 30 pack-year currently smoking OR have quit w/in 15years.) does not qualify.   Hepatitis C Screening: does not qualify.  Vision Screening: Recommended annual ophthalmology exams for early detection of glaucoma and other disorders of the eye. Is the patient up to date with their annual eye exam?  Yes   Dental Screening: Recommended annual dental exams for proper oral hygiene  Community Resource Referral / Chronic Care Management: CRR required this visit?  No   CCM required this visit?  No       Plan:   Keep all routine maintenance appointments.   I have personally reviewed and noted the following in the patient's chart:   Medical and social history Use of alcohol, tobacco or illicit drugs  Current medications and supplements including opioid prescriptions. Patient is not currently taking opioid prescriptions. Functional ability and status Nutritional status Physical activity Advanced directives List of other physicians Hospitalizations, surgeries, and ER visits  in previous 12 months Vitals Screenings to include cognitive, depression, and falls Referrals and appointments  In addition, I have reviewed and discussed with patient certain preventive protocols, quality metrics, and best practice recommendations. A written personalized care plan for preventive services as well as general preventive health recommendations were provided to patient via mychart.     OBrien-Blaney, [place] Sloop L, LPN   [phone]    I have reviewed the above information and agree with above.   Deborra Medina, MD

## 2021-07-24 NOTE — Telephone Encounter (Signed)
Spoke with pt's daughter and she stated that the pt has not ever had trouble with incontinence until the last 4 or 5 days. Daughter was advised that this could be due to UTI. Can pt come by and give a urine sample and schedule a 15 minute telephone/virtual visit later in the week?

## 2021-07-25 ENCOUNTER — Telehealth: Payer: Self-pay

## 2021-07-25 ENCOUNTER — Other Ambulatory Visit: Payer: Medicare Other

## 2021-07-25 DIAGNOSIS — R32 Unspecified urinary incontinence: Secondary | ICD-10-CM

## 2021-07-25 LAB — POCT URINALYSIS DIPSTICK
Bilirubin, UA: NEGATIVE
Blood, UA: NEGATIVE
Glucose, UA: NEGATIVE
Ketones, UA: NEGATIVE
Nitrite, UA: NEGATIVE
Protein, UA: NEGATIVE
Spec Grav, UA: 1.015 (ref 1.010–1.025)
Urobilinogen, UA: 0.2 E.U./dL
pH, UA: 7 (ref 5.0–8.0)

## 2021-07-25 NOTE — Telephone Encounter (Signed)
Orders placed for lab appt 

## 2021-07-25 NOTE — Telephone Encounter (Signed)
Pt's daughter dropped off a urine specimen for pt. There was not enough specimen to send off for either a UA w/micro or the culture per Latoya. She did a POCT dipstick instead. Results have been entered.

## 2021-07-25 NOTE — Telephone Encounter (Signed)
error 

## 2021-07-26 ENCOUNTER — Other Ambulatory Visit: Payer: Self-pay

## 2021-07-26 ENCOUNTER — Telehealth (INDEPENDENT_AMBULATORY_CARE_PROVIDER_SITE_OTHER): Payer: Medicare Other | Admitting: Internal Medicine

## 2021-07-26 VITALS — Ht 68.0 in | Wt 129.0 lb

## 2021-07-26 DIAGNOSIS — N3941 Urge incontinence: Secondary | ICD-10-CM | POA: Diagnosis not present

## 2021-07-26 DIAGNOSIS — N401 Enlarged prostate with lower urinary tract symptoms: Secondary | ICD-10-CM

## 2021-07-26 NOTE — Telephone Encounter (Signed)
Spoke with pt's daughter and informed her of Dr. Lupita Dawn message below.  Daughter would like to keep the virtual appt for today.

## 2021-07-26 NOTE — Progress Notes (Signed)
Virtual Visit  converted to Telephone   This visit type was conducted due to national recommendations for restrictions regarding the COVID-19 pandemic (e.g. social distancing).  This format is felt to be most appropriate for this patient at this time.  All issues noted in this document were discussed and addressed.  No physical exam was performed (except for noted visual exam findings with Video Visits).   I connected withNAME@ on 07/26/21 at  1:15 PM EDT by a video enabled telemedicine application and verified that I am speaking with the correct person using two identifiers. Location patient: home Location provider: work or home office Persons participating in the virtual visit: patient, provider  I discussed the limitations, risks, security and privacy concerns of performing an evaluation and management service by telephone and the availability of in person appointments. I also discussed with the patient that there may be a patient responsible charge related to this service. The patient expressed understanding and agreed to proceed.  Interactive audio and video telecommunications were attempted between this provider and patient, however failed, due to patient having technical difficulties .  We continued and completed visit with audio only.   Reason for visit:  urinary incontinence for the past 1-2 weeks.   HPI:  87 yr old male with recent onset of urinary incontinence,  diagnosed with BPH by Dr Caryl Bis on Sept 6 and  started on Flomax less than 2 weeks ago, presents for follow up .  Sample provided earlier in the week was too small a sample to use.   Had a fall in July and fractured 2 vertebrae.  This occurred on July 25,  PT is scheduled to start next week.  Wearing a back brace   Urge incontinence for the last week . Denies pain with voiding and  small volume voids.  Urine stream is strong.  . Small sample given, questioned why:  he states it was  not due to retention .  He actually had  more but  spilled the cup into the sink so he did to wait to recollect a specimen    ROS: See pertinent positives and negatives per HPI.  Past Medical History:  Diagnosis Date   Emphysema of lung ([place])    Glaucoma    Hypertension     No past surgical history on file.  Family History  Problem Relation Age of Onset   Drug abuse Brother    Cancer Brother     SOCIAL HX: . reports that he quit smoking about 58 years ago. His smoking use included cigarettes. He has never used smokeless tobacco. He reports that he does not drink alcohol and does not use drugs.    Current Outpatient Medications:    Acetaminophen 325 MG CAPS, Take by mouth., Disp: , Rfl:    brinzolamide (AZOPT) 1 % ophthalmic suspension, 1 drop 2 (two) times daily., Disp: , Rfl:    ferrous fumarate-iron polysaccharide complex (TANDEM) 162-115.2 MG CAPS capsule, Take 1 capsule by mouth daily with breakfast., Disp: 30 capsule, Rfl: 2   hydrochlorothiazide (HYDRODIURIL) 25 MG tablet, TAKE 1 TABLET BY MOUTH ONCE A DAY, Disp: 90 tablet, Rfl: 1   magnesium hydroxide (MILK OF MAGNESIA) 400 MG/5ML suspension, Take by mouth daily as needed for mild constipation., Disp: , Rfl:    tamsulosin (FLOMAX) 0.4 MG CAPS capsule, Take 1 capsule (0.4 mg total) by mouth daily., Disp: 30 capsule, Rfl: 3   aspirin EC 81 MG tablet, Take 81 mg by mouth daily. (Patient  not taking: Reported on 07/26/2021), Disp: , Rfl:   EXAM:   General impression: alert, cooperative and articulate.  No signs of being in distress  Lungs: speech is fluent sentence length suggests that patient is not short of breath and not punctuated by cough, sneezing or sniffing. Marland Kitchen   Psych: affect normal.  speech is articulate and non pressured .  Denies suicidal thoughts   ASSESSMENT AND PLAN:  Discussed the following assessment and plan:  Urge incontinence of urine - Plan: Urinalysis, Routine w reflex microscopic, Urine Culture, DME Bedside commode  Benign prostatic  hyperplasia (BPH) with urinary urge incontinence  Benign prostatic hyperplasia (BPH) with urinary urge incontinence Continue tamsulosin. UA and culture needed.  bedside commode ordered.  Urology referral offered but deferred.    I discussed the assessment and treatment plan with the patient. The patient was provided an opportunity to ask questions and all were answered. The patient agreed with the plan and demonstrated an understanding of the instructions.   The patient was advised to call back or seek an in-person evaluation if the symptoms worsen or if the condition fails to improve as anticipated.   I spent 30 minutes dedicated to the care of this patient on the date of this encounter to include pre-visit review of his medical history,  Face-to-face time with the patient , and post visit ordering of testing and therapeutics.    Jason Mc, MD

## 2021-07-27 ENCOUNTER — Other Ambulatory Visit (INDEPENDENT_AMBULATORY_CARE_PROVIDER_SITE_OTHER): Payer: Medicare Other

## 2021-07-27 DIAGNOSIS — N3941 Urge incontinence: Secondary | ICD-10-CM

## 2021-07-27 LAB — URINALYSIS, ROUTINE W REFLEX MICROSCOPIC
Bilirubin Urine: NEGATIVE
Ketones, ur: NEGATIVE
Leukocytes,Ua: NEGATIVE
Nitrite: NEGATIVE
Specific Gravity, Urine: 1.01 (ref 1.000–1.030)
Total Protein, Urine: NEGATIVE
Urine Glucose: NEGATIVE
Urobilinogen, UA: 0.2 (ref 0.0–1.0)
pH: 7 (ref 5.0–8.0)

## 2021-07-28 LAB — URINE CULTURE
MICRO NUMBER:: 12379150
SPECIMEN QUALITY:: ADEQUATE

## 2021-07-29 NOTE — Assessment & Plan Note (Signed)
Continue tamsulosin. UA and culture needed.  bedside commode ordered.  Urology referral offered but deferred.

## 2021-08-01 ENCOUNTER — Inpatient Hospital Stay: Payer: Medicare Other

## 2021-08-01 ENCOUNTER — Inpatient Hospital Stay: Payer: Medicare Other | Attending: Oncology | Admitting: Oncology

## 2021-08-01 ENCOUNTER — Encounter: Payer: Self-pay | Admitting: Oncology

## 2021-08-01 VITALS — BP 108/63 | HR 82 | Temp 98.2°F | Resp 16 | Ht 68.0 in | Wt 127.7 lb

## 2021-08-01 DIAGNOSIS — N401 Enlarged prostate with lower urinary tract symptoms: Secondary | ICD-10-CM | POA: Diagnosis not present

## 2021-08-01 DIAGNOSIS — R5383 Other fatigue: Secondary | ICD-10-CM | POA: Insufficient documentation

## 2021-08-01 DIAGNOSIS — M25569 Pain in unspecified knee: Secondary | ICD-10-CM | POA: Insufficient documentation

## 2021-08-01 DIAGNOSIS — D7589 Other specified diseases of blood and blood-forming organs: Secondary | ICD-10-CM | POA: Diagnosis not present

## 2021-08-01 DIAGNOSIS — Z87891 Personal history of nicotine dependence: Secondary | ICD-10-CM | POA: Insufficient documentation

## 2021-08-01 DIAGNOSIS — Z79899 Other long term (current) drug therapy: Secondary | ICD-10-CM | POA: Insufficient documentation

## 2021-08-01 DIAGNOSIS — N3941 Urge incontinence: Secondary | ICD-10-CM | POA: Insufficient documentation

## 2021-08-01 DIAGNOSIS — Z814 Family history of other substance abuse and dependence: Secondary | ICD-10-CM | POA: Diagnosis not present

## 2021-08-01 DIAGNOSIS — Z809 Family history of malignant neoplasm, unspecified: Secondary | ICD-10-CM | POA: Diagnosis not present

## 2021-08-01 DIAGNOSIS — R634 Abnormal weight loss: Secondary | ICD-10-CM | POA: Insufficient documentation

## 2021-08-01 DIAGNOSIS — D539 Nutritional anemia, unspecified: Secondary | ICD-10-CM | POA: Insufficient documentation

## 2021-08-01 DIAGNOSIS — K5900 Constipation, unspecified: Secondary | ICD-10-CM | POA: Insufficient documentation

## 2021-08-01 DIAGNOSIS — Z9049 Acquired absence of other specified parts of digestive tract: Secondary | ICD-10-CM | POA: Insufficient documentation

## 2021-08-01 NOTE — Progress Notes (Signed)
Pt and step daughter in for new pt visit.  Referred by Dr Biagio Quint for macrocytic anemia.  Pt reports falling in July 2022 and broken vertebrae in 3 places.  Has back brace.. Pt lives alone.

## 2021-08-02 ENCOUNTER — Other Ambulatory Visit: Payer: Self-pay

## 2021-08-02 ENCOUNTER — Ambulatory Visit: Payer: Medicare Other | Attending: Internal Medicine | Admitting: Physical Therapy

## 2021-08-02 ENCOUNTER — Encounter: Payer: Self-pay | Admitting: Physical Therapy

## 2021-08-02 DIAGNOSIS — M6281 Muscle weakness (generalized): Secondary | ICD-10-CM | POA: Insufficient documentation

## 2021-08-02 DIAGNOSIS — R2681 Unsteadiness on feet: Secondary | ICD-10-CM | POA: Diagnosis not present

## 2021-08-02 DIAGNOSIS — R262 Difficulty in walking, not elsewhere classified: Secondary | ICD-10-CM | POA: Diagnosis not present

## 2021-08-02 NOTE — Therapy (Signed)
[place] MAIN [facility] SERVICES [address] [place], [place], [address] Phone: [phone]   Fax:  [phone]  Physical Therapy Evaluation  Patient Details  Name: Jason Powers MRN: [id number] Date of Birth: [birth year] Referring Provider (PT): Dr. Freddie Apley. Izora Ribas   Encounter Date: 08/02/2021   PT End of Session - 08/02/21 1123     Visit Number 1    Number of Visits 17    Date for PT Re-Evaluation 09/27/21    Authorization Type Medicare, start of care 08/02/21    PT Start Time 0805    PT Stop Time 0910    PT Time Calculation (min) 65 min    Equipment Utilized During Treatment Gait belt    Activity Tolerance Patient tolerated treatment well    Behavior During Therapy WFL for tasks assessed/performed             Past Medical History:  Diagnosis Date   COPD (chronic obstructive pulmonary disease) ([place])    Glaucoma    Hypertension     Past Surgical History:  Procedure Laterality Date   APPENDECTOMY      There were no vitals filed for this visit.    Subjective Assessment - 08/02/21 0821     Subjective "I need to walk better"    Patient is accompained by: Family member   Daughters: [name] and [place]   Pertinent History 87 yo Male s/p fall on 06/05/21 with subsequent T9 and T12 compression fracture. He was given TLSO brace which he is supposed to wear all the time when out of bed. However he is unable to use the bathroom with brace on and therefore will take it off. he lives alone and his daughters come and check on him 3-4 times a day. He is unable to don brace by himself and therefore he does move around some without brace on. Patient denies any back pain. He does have sacral discomfort with prolonged sitting due to loss of soft tissue. He does report difficulty with transfers and increased weakness in LE. He is currently using a rollator and requires it to help get up. Patient does express a fear of falling. He denies any  numbness/tingling; Daughters prepare his meals. He does require supervision for showers for safety. Daughters help with don socks but otherwise is modified independent in dressing; He does require assistance with don/doff back brace;    How long can you sit comfortably? needs a cushion- has sacral discomfort due to loss of soft tissue    How long can you stand comfortably? <10 min    How long can you walk comfortably? short distances    Diagnostic tests MRI on 8/17: Subacute T12 compression fracture with 50% height loss and 6 mm of  retropulsion that indents the ventral cord.     Subacute T9 compression fracture with mild height loss and no  retropulsion.    Patient Stated Goals "I want to accomplish to whatever I can" He wants to get back to driving, walk with cane (not use walker if possible);    Currently in Pain? No/denies    Multiple Pain Sites No                [facility] PT Assessment - 08/02/21 0001       Assessment   Medical Diagnosis thoracic compression fracture (T9, T12), imbalance    Referring Provider (PT) Dr. Freddie Apley. Izora Ribas    Onset Date/Surgical Date 06/05/21    Hand Dominance Right  Prior Therapy denies any PT for this condition;      Precautions   Precautions Fall;Back    Required Braces or Orthoses Spinal Brace      Restrictions   Weight Bearing Restrictions No      Balance Screen   Has the patient fallen in the past 6 months Yes    How many times? 1    Has the patient had a decrease in activity level because of a fear of falling?  Yes    Is the patient reluctant to leave their home because of a fear of falling?  Yes      [place] Private residence    Living Arrangements Alone    Available Help at Discharge Family;Available PRN/intermittently    Type of Home House    Home Access Stairs to enter    Entrance Stairs-Number of Steps 3    Entrance Stairs-Rails Right;Left;Cannot reach both   negotiates steps sideways with both  hands on [place] One level    [place] - 4 wheels;Shower seat;Grab bars - toilet;Grab bars - tub/shower;Kasandra Knudsen - single point      Prior Function   Level of Independence Independent with basic ADLs    Vocation Retired    Biochemist, clinical at [employer]    Leisure likes to go places, Oceanographer, watches sports      Cognition   Overall Cognitive Status Within Functional Limits for tasks assessed      Observation/Other Assessments   Observations very nice gentleman    Skin Integrity grossly intact    Focus on Therapeutic Outcomes (FOTO)  34%      Sensation   Light Touch Appears Intact      Coordination   Gross Motor Movements are Fluid and Coordinated Yes    Fine Motor Movements are Fluid and Coordinated Yes      Posture/Postural Control   Posture Comments moderate to severe fixed thoracic kyphosis with forward head and decreased lumbar lordosis;      ROM / Strength   AROM / PROM / Strength AROM;Strength      AROM   Overall AROM Comments BUE and BLE are [facility]      Strength   Overall Strength Comments UE grossly 3/5, BLE grossly 3+/5      Palpation   Palpation comment denies any tenderness to palpation;      Transfers   Comments requires HHA to push up from chair      Ambulation/Gait   Gait Comments pt ambulates with rollator, reciprocal gait pattern, forward flexed posture with moderate to severe thoracic kyphosis and forward head (fixed); slower gait speed, normal base of support      Standardized Balance Assessment   Standardized Balance Assessment Five Times Sit to Stand;10 meter walk test;Timed Up and Go Test    Five times sit to stand comments  23.28 sec pushing on arm rests of chair (>15 sec indicates increased risk for falls)    10 Meter Walk 18.2 sec (0.55 m/s) with Rollator, limited home ambulator, increased risk for falls      Timed Up and Go Test   Normal TUG (seconds) 25.15    TUG Comments with rollator, close  supervision for safety      High Level Balance   High Level Balance Comments unable to hold tandem stance; able to stand feet apart, short time with CGA unsupported, increased unsteadiness reported  Objective measurements completed on examination: See above findings.                PT Education - 08/02/21 1123     Education Details plan of care    Person(s) Educated Patient;Child(ren)    Methods Explanation    Comprehension Verbalized understanding                PT Short Term Goals - 08/03/21 1158       PT SHORT TERM GOAL #1   Title Patient will be adherent with HEP at least 3x a week to increase LE strength and improve mobility.    Time 4    Period Weeks    Status New    Target Date 08/31/21      PT SHORT TERM GOAL #2   Title Patient will be modified independent in transferring sit<>Stand without pushing on arm rests of chair to demonstrate improved transfer ability and increase mobility.    Time 4    Period Weeks    Status New    Target Date 08/31/21      PT SHORT TERM GOAL #3   Title Patient will improve 10 meter walk speed to >0.7 m/s to indicate improved gait safety for less fall risk and improved home ambulation.    Time 4    Period Weeks    Status New    Target Date 08/31/21             PT Long Term Goals - 08/03/21 1159       PT LONG TERM GOAL #1   Title Patient will improve BLE gross strength to >4+/5 to indicate improved functional strength for gait and standing ADLs.    Time 8    Period Weeks    Status New    Target Date 09/27/21      PT LONG TERM GOAL #2   Title Patient will improve 10 meter walk speed to >1.0 m/s with least restrictive assistive device to improve safety with home and community ambulation and reduce fall risk.    Time 8    Period Weeks    Status New    Target Date 09/27/21      PT LONG TERM GOAL #3   Title Patient will improve FOTO score to >50% to indicate improved  functional mobility with ADLs.    Time 8    Period Weeks    Status New    Target Date 09/27/21      PT LONG TERM GOAL #4   Title Patient will reduce timed up and go to <14 sec with least restrictive assistive device to reduce fall risk and improve overall mobility.    Time 8    Period Weeks    Status New    Target Date 09/27/21      PT LONG TERM GOAL #5   Title Patient will ambulate short distances on level surface with SPC modified independent exhibiting good safety awareness to improve mobility and safety in home.    Time 8    Period Weeks    Status New    Target Date 09/27/21      Additional Long Term Goals   Additional Long Term Goals Yes      PT LONG TERM GOAL #6   Title Patient will improve Berg balance score to >47/56 to reduce fall risk with ADLs.    Time 8    Period Weeks    Status New  Target Date 09/27/21                      Plan - 08/02/21 1124     Clinical Impression Statement 87 yo male fell on 06/05/21 and suffered T9 and T12 vertebral compression fracture. Patient is currently living alone. He has 2 daughters that check on him 3-4 times a day. They provide all meals and help with cleaning and housework. Patient was walking with intermittent walking stick and still driving prior to fall. Currently he is not driving and walking with rollator. He has a TLSO brace which he is supposed to wear when out of bed. Patient is wearing it most of the time, but does have to remove brace to use the bathroom. If he is home alone he is unable to get brace back on by himself so he will wait until daughters come over to help don brace. He denies any back pain currently at rest or with activity. He reports impaired balance and weakness as biggest limitations. He does exhibit weakness in BUE and BLE. Patient also tests as a high risk for falls. He is currently ambulating short distances with rollator with slower gait speed. He is able to transfer mod I but does require  rail assist or support to stand. He would benefit from skilled PT Intervention to improve strength, balance and mobility;    Personal Factors and Comorbidities Comorbidity 3+;Age;Fitness    Comorbidities Prostate issues, incontinence, arthritis, thoracic compression fracture due to recent fall    Examination-Activity Limitations Bend;Carry;Locomotion Level;Squat;Stairs;Stand;Transfers    Examination-Participation Restrictions Cleaning;Community Activity;Driving;Meal Prep;Shop;Volunteer;Yard Work    Stability/Clinical Decision Making Stable/Uncomplicated    Designer, jewellery Low    Rehab Potential Good    PT Frequency 2x / week    PT Duration 8 weeks    PT Treatment/Interventions ADLs/Self Care Home Management;Cryotherapy;Moist Heat;Gait training;Stair training;Functional mobility training;Therapeutic exercise;Balance training;Neuromuscular re-education;Therapeutic activities;Patient/family education;Orthotic Fit/Training;Energy conservation    PT Next Visit Plan Further assess balance, initiate HEP, strengthening    PT Home Exercise Plan will address next session    Recommended Other Services none    Consulted and Agree with Plan of Care Patient             Patient will benefit from skilled therapeutic intervention in order to improve the following deficits and impairments:  Abnormal gait, Decreased balance, Decreased endurance, Decreased mobility, Difficulty walking  Visit Diagnosis: Muscle weakness (generalized)  Unsteadiness on feet  Difficulty in walking, not elsewhere classified     Problem List Patient Active Problem List   Diagnosis Date Noted   Macrocytic anemia 07/18/2021   Benign prostatic hyperplasia (BPH) with urinary urge incontinence 07/18/2021   Compression fracture of thoracic vertebra ([place]) 06/16/2021   Constipation 06/16/2021   Weight loss 06/16/2021   Bilateral leg weakness 08/30/2020   Knee pain, chronic 07/18/2020   Iron deficiency anemia  03/02/2020   Fluid retention in legs 02/27/2020   Fatigue 02/27/2020    Jason Powers, PT, DPT 08/02/2021, 2:31 PM  [place] MAIN [facility] SERVICES [address] [place], [place], [address] Phone: [phone]   Fax:  [phone]  Name: Jason Powers MRN: [id number] Date of Birth: 1921/03/11

## 2021-08-03 NOTE — Progress Notes (Signed)
Hematology/Oncology Consult note [facility] [phone] [phone]  Patient Care Team: Crecencio Mc, MD as PCP - General (Internal Medicine)   Name of the patient: Jason Powers  [id number]  Jan 20, 1921    Reason for referral-anemia   Referring physician-Dr. Derrel Nip  Date of visit: 08/03/21   History of presenting illness- Patient is a 87 year old male with a past medical history significant for BPH and chronic knee pain but no other medical issues.  He continues to live independently in his 2 daughters assist him with his ADLs.  He has been referred to [place] for macrocytic anemia.  Looking back at his prior CBCs over the last 1 year patient has had a normal white count and platelet count and hemoglobin has been between 10-12.  Since September 2021 patient began to develop progressive macrocytosis with an MCV ranging between 101 203.  Most recent CBC from 07/18/2021 showed a white count of 6.2, H&H of 11.9/36.7 with an MCV of 104 and a platelet count of 251.  B12 level was normal at 613.  Folate and TSH was normal.  ECOG PS- 2  Pain scale- 0   Review of systems- Review of Systems  Constitutional:  Positive for malaise/fatigue. Negative for chills, fever and weight loss.  HENT:  Negative for congestion, ear discharge and nosebleeds.   Eyes:  Negative for blurred vision.  Respiratory:  Negative for cough, hemoptysis, sputum production, shortness of breath and wheezing.   Cardiovascular:  Negative for chest pain, palpitations, orthopnea and claudication.  Gastrointestinal:  Negative for abdominal pain, blood in stool, constipation, diarrhea, heartburn, melena, nausea and vomiting.  Genitourinary:  Negative for dysuria, flank pain, frequency, hematuria and urgency.  Musculoskeletal:  Negative for back pain, joint pain and myalgias.  Skin:  Negative for rash.  Neurological:  Negative for dizziness, tingling, focal weakness, seizures, weakness  and headaches.  Endo/Heme/Allergies:  Does not bruise/bleed easily.  Psychiatric/Behavioral:  Negative for depression and suicidal ideas. The patient does not have insomnia.    No Known Allergies  Patient Active Problem List   Diagnosis Date Noted   Macrocytic anemia 07/18/2021   Benign prostatic hyperplasia (BPH) with urinary urge incontinence 07/18/2021   Compression fracture of thoracic vertebra (HCC) 06/16/2021   Constipation 06/16/2021   Weight loss 06/16/2021   Bilateral leg weakness 08/30/2020   Knee pain, chronic 07/18/2020   Iron deficiency anemia 03/02/2020   Fluid retention in legs 02/27/2020   Fatigue 02/27/2020     Past Medical History:  Diagnosis Date   COPD (chronic obstructive pulmonary disease) (HCC)    Glaucoma    Hypertension      Past Surgical History:  Procedure Laterality Date   APPENDECTOMY      Social History   Socioeconomic History   Marital status: Widowed    Spouse name: Not on file   Number of children: Not on file   Years of education: Not on file   Highest education level: Not on file  Occupational History   Not on file  Tobacco Use   Smoking status: Former    Types: Cigarettes    Quit date: 1964    Years since quitting: 58.7   Smokeless tobacco: Never  Vaping Use   Vaping Use: Never used  Substance and Sexual Activity   Alcohol use: No   Drug use: No   Sexual activity: Not Currently  Other Topics Concern   Not on file  Social History Narrative   Not on  file   Social Determinants of Health   Financial Resource Strain: Low Risk    Difficulty of Paying Living Expenses: Not hard at all  Food Insecurity: No Food Insecurity   Worried About Charity fundraiser in the Last Year: Never true   Arboriculturist in the Last Year: Never true  Transportation Needs: No Transportation Needs   Lack of Transportation (Medical): No   Lack of Transportation (Non-Medical): No  Physical Activity: Not on file  Stress: No Stress Concern  Present   Feeling of Stress : Not at all  Social Connections: Unknown   Frequency of Communication with Friends and Family: More than three times a week   Frequency of Social Gatherings with Friends and Family: More than three times a week   Attends Religious Services: Not on Electrical engineer or Organizations: Not on file   Attends Archivist Meetings: Not on file   Marital Status: Not on file  Intimate Partner Violence: Not At Risk   Fear of Current or Ex-Partner: No   Emotionally Abused: No   Physically Abused: No   Sexually Abused: No     Family History  Problem Relation Age of Onset   Drug abuse Brother    Pharmacologist      Current Outpatient Medications:    Acetaminophen 325 MG CAPS, Take by mouth., Disp: , Rfl:    brinzolamide (AZOPT) 1 % ophthalmic suspension, 1 drop 2 (two) times daily., Disp: , Rfl:    ferrous fumarate-iron polysaccharide complex (TANDEM) 162-115.2 MG CAPS capsule, Take 1 capsule by mouth daily with breakfast., Disp: 30 capsule, Rfl: 2   hydrochlorothiazide (HYDRODIURIL) 25 MG tablet, TAKE 1 TABLET BY MOUTH ONCE A DAY, Disp: 90 tablet, Rfl: 1   magnesium hydroxide (MILK OF MAGNESIA) 400 MG/5ML suspension, Take by mouth daily as needed for mild constipation., Disp: , Rfl:    tamsulosin (FLOMAX) 0.4 MG CAPS capsule, Take 1 capsule (0.4 mg total) by mouth daily., Disp: 30 capsule, Rfl: 3   Physical exam:  Vitals:   08/01/21 1536  BP: 108/63  Pulse: 82  Resp: 16  Temp: 98.2 F (36.8 C)  TempSrc: Tympanic  SpO2: 97%  Weight: 127 lb 11.2 oz (57.9 kg)  Height: _0  (1.727 m)   Physical Exam Constitutional:      General: He is not in acute distress. Cardiovascular:     Rate and Rhythm: Normal rate and regular rhythm.     Heart sounds: Normal heart sounds.  Pulmonary:     Effort: Pulmonary effort is normal.     Breath sounds: Normal breath sounds.  Abdominal:     General: Bowel sounds are normal.     Palpations:  Abdomen is soft.  Lymphadenopathy:     Comments: No palpable cervical, supraclavicular, axillary or inguinal adenopathy    Skin:    General: Skin is warm and dry.  Neurological:     Mental Status: He is alert and oriented to person, place, and time.       CMP Latest Ref Rng & Units 06/16/2021  Glucose 70 - 99 mg/dL 112(H)  BUN 6 - 23 mg/dL 29(H)  Creatinine 0.40 - 1.50 mg/dL 1.26  Sodium 135 - 145 mEq/L 134(L)  Potassium 3.5 - 5.1 mEq/L 3.7  Chloride 96 - 112 mEq/L 94(L)  CO2 19 - 32 mEq/L 31  Calcium 8.4 - 10.5 mg/dL 9.0  Total Protein 6.0 - 8.3 g/dL 6.7  Total Bilirubin 0.2 - 1.2 mg/dL 0.7  Alkaline Phos 39 - 117 U/L 65  AST 0 - 37 U/L 15  ALT 0 - 53 U/L 12   CBC Latest Ref Rng & Units 07/18/2021  WBC 4.0 - 10.5 K/uL 6.2  Hemoglobin 13.0 - 17.0 g/dL 11.9(L)  Hematocrit 39.0 - 52.0 % 36.7(L)  Platelets 150.0 - 400.0 K/uL 251.0   Assessment and plan- Patient is a 87 y.o. male referred for mild macrocytic anemia  Patient's hemoglobin is remained stable between 11-12 at least for the last 1 year.His anemia is presently mild with a hemoglobin of 11.9.  He does have ongoing macrocytosis.  B12 folate TSH and liver functions have all been normal.  Suspect his macrocytosis could be due to age-related MDS.  However patient does not have any significant cytopenias at this time that would warrant a bone marrow biopsy that would change management.  Keeping his age and quality of life as well as relatively stable anemia over the last 1 year I am inclined to monitor this conservatively.  He does not require any blood work at this time and I will see him back in 6 months with CBC with differential, myeloma panel, serum free light chains B12 folate ferritin and iron studies   Thank you for this kind referral and the opportunity to participate in the care of this patient   Visit Diagnosis 1. Macrocytic anemia     Dr. Randa Evens, MD, MPH [facility] [phone] 08/03/2021

## 2021-08-07 ENCOUNTER — Other Ambulatory Visit: Payer: Self-pay

## 2021-08-07 ENCOUNTER — Encounter: Payer: Self-pay | Admitting: Physical Therapy

## 2021-08-07 ENCOUNTER — Ambulatory Visit: Payer: Medicare Other | Admitting: Physical Therapy

## 2021-08-07 DIAGNOSIS — R2681 Unsteadiness on feet: Secondary | ICD-10-CM

## 2021-08-07 DIAGNOSIS — R262 Difficulty in walking, not elsewhere classified: Secondary | ICD-10-CM | POA: Diagnosis not present

## 2021-08-07 DIAGNOSIS — M6281 Muscle weakness (generalized): Secondary | ICD-10-CM | POA: Diagnosis not present

## 2021-08-07 NOTE — Therapy (Signed)
[place] MAIN [facility] SERVICES [address] [place], [place], [address] Phone: [phone]   Fax:  [phone]  Physical Therapy Treatment  Patient Details  Name: Jason Powers MRN: [id number] Date of Birth: 1920-12-18 Referring Provider (PT): Dr. Freddie Apley. Izora Ribas   Encounter Date: 08/07/2021   PT End of Session - 08/07/21 1227     Visit Number 2    Number of Visits 17    Date for PT Re-Evaluation 09/27/21    Authorization Type Medicare, start of care 08/02/21    PT Start Time 1018    PT Stop Time 1102    PT Time Calculation (min) 44 min    Equipment Utilized During Treatment Gait belt    Activity Tolerance Patient tolerated treatment well    Behavior During Therapy WFL for tasks assessed/performed             Past Medical History:  Diagnosis Date   COPD (chronic obstructive pulmonary disease) ([place])    Glaucoma    Hypertension     Past Surgical History:  Procedure Laterality Date   APPENDECTOMY      There were no vitals filed for this visit.   Subjective Assessment - 08/07/21 1226     Subjective "I am feeling good" He denies any new falls. Reports he has been able to take a few steps without his walker.    Patient is accompained by: Family member   Daughters: linda and [place]   Pertinent History 87 yo Male s/p fall on 06/05/21 with subsequent T9 and T12 compression fracture. He was given TLSO brace which he is supposed to wear all the time when out of bed. However he is unable to use the bathroom with brace on and therefore will take it off. he lives alone and his daughters come and check on him 3-4 times a day. He is unable to don brace by himself and therefore he does move around some without brace on. Patient denies any back pain. He does have sacral discomfort with prolonged sitting due to loss of soft tissue. He does report difficulty with transfers and increased weakness in LE. He is currently using a rollator and  requires it to help get up. Patient does express a fear of falling. He denies any numbness/tingling; Daughters prepare his meals. He does require supervision for showers for safety. Daughters help with don socks but otherwise is modified independent in dressing; He does require assistance with don/doff back brace;    How long can you sit comfortably? needs a cushion- has sacral discomfort due to loss of soft tissue    How long can you stand comfortably? <10 min    How long can you walk comfortably? short distances    Diagnostic tests MRI on 8/17: Subacute T12 compression fracture with 50% height loss and 6 mm of  retropulsion that indents the ventral cord.     Subacute T9 compression fracture with mild height loss and no  retropulsion.    Patient Stated Goals "I want to accomplish to whatever I can" He wants to get back to driving, walk with cane (not use walker if possible);    Currently in Pain? No/denies    Multiple Pain Sites No                [facility] PT Assessment - 08/07/21 0001       Standardized Balance Assessment   Standardized Balance Assessment Berg Balance Test      [place] Balance  Test   Sit to Stand Able to stand  independently using hands    Standing Unsupported Able to stand 30 seconds unsupported    Sitting with Back Unsupported but Feet Supported on Floor or Stool Able to sit safely and securely 2 minutes    Stand to Sit Controls descent by using hands    Transfers Needs one person to assist    Standing Unsupported with Eyes Closed Able to stand 3 seconds    Standing Unsupported with Feet Together Needs help to attain position but able to stand for 30 seconds with feet together    From Standing, Reach Forward with Outstretched Arm Can reach forward >12 cm safely (5")    From Standing Position, Pick up Object from Floor Unable to pick up shoe, but reaches 2-5 cm (1-2") from shoe and balances independently    From Standing Position, Turn to Look Behind Over each Shoulder  Needs assist to keep from losing balance and falling    Turn 360 Degrees Needs assistance while turning    Standing Unsupported, Alternately Place Feet on Step/Stool Able to complete >2 steps/needs minimal assist    Standing Unsupported, One Foot in Front Needs help to step but can hold 15 seconds    Standing on One Leg Unable to try or needs assist to prevent fall    Total Score 23    Berg comment: <36/56 indicates 100% risk for falls                Mack: TREATMENT: Instructed patient in [place] Balance Assessment, see above;   Instructed patient in LE strengthening exercise as part of HEP: Access Code: [email or web address] URL: [email or web address] Date: 08/07/2021 Prepared by: Blanche East  Exercises  Seated March with Resistance - 1 x daily - 7 x weekly - 2 sets - 10 reps Seated Hip Abduction with Resistance - 1 x daily - 7 x weekly - 2 sets - 10 reps Seated Knee Extension with Resistance - 1 x daily - 7 x weekly - 2 sets - 10 reps Sit to Stand with Counter Support - 1 x daily - 7 x weekly - 2 sets - 5 reps Patient required min VCs for proper exercise technique and positioning. Provided written HEP for better adherence;   Patient does require min A to don/doff tband. His daughter reports she would help him do his HEP  Patient instructed in advanced LE strengthening: Side step up on 4 inch step with BUE rail assist x5 reps each direction Standing one foot on firm surface, one foot on 4 inch step with 1-0 rail assist 15 sec hold x1 rep each with cues for neutral weight shift and min A for safety;  Patient tolerated session well. He does report mild fatigue at end of session. He verbalized understanding of HEP. Patient required mod VCs to improve erect posture with advanced standing exercise;                      PT Education - 08/07/21 1227     Education Details HEP, balance/strengthening;    Person(s) Educated Patient    Methods  Explanation;Verbal cues;Handout    Comprehension Verbalized understanding;Returned demonstration;Verbal cues required;Need further instruction              PT Short Term Goals - 08/03/21 1158       PT SHORT TERM GOAL #1   Title Patient will be adherent with HEP at least 3x a week to increase  LE strength and improve mobility.    Time 4    Period Weeks    Status New    Target Date 08/31/21      PT SHORT TERM GOAL #2   Title Patient will be modified independent in transferring sit<>Stand without pushing on arm rests of chair to demonstrate improved transfer ability and increase mobility.    Time 4    Period Weeks    Status New    Target Date 08/31/21      PT SHORT TERM GOAL #3   Title Patient will improve 10 meter walk speed to >0.7 m/s to indicate improved gait safety for less fall risk and improved home ambulation.    Time 4    Period Weeks    Status New    Target Date 08/31/21               PT Long Term Goals - 08/03/21 1159       PT LONG TERM GOAL #1   Title Patient will improve BLE gross strength to >4+/5 to indicate improved functional strength for gait and standing ADLs.    Time 8    Period Weeks    Status New    Target Date 09/27/21      PT LONG TERM GOAL #2   Title Patient will improve 10 meter walk speed to >1.0 m/s with least restrictive assistive device to improve safety with home and community ambulation and reduce fall risk.    Time 8    Period Weeks    Status New    Target Date 09/27/21      PT LONG TERM GOAL #3   Title Patient will improve FOTO score to >50% to indicate improved functional mobility with ADLs.    Time 8    Period Weeks    Status New    Target Date 09/27/21      PT LONG TERM GOAL #4   Title Patient will reduce timed up and go to <14 sec with least restrictive assistive device to reduce fall risk and improve overall mobility.    Time 8    Period Weeks    Status New    Target Date 09/27/21      PT LONG TERM GOAL #5    Title Patient will ambulate short distances on level surface with SPC modified independent exhibiting good safety awareness to improve mobility and safety in home.    Time 8    Period Weeks    Status New    Target Date 09/27/21      Additional Long Term Goals   Additional Long Term Goals Yes      PT LONG TERM GOAL #6   Title Patient will improve Berg balance score to >47/56 to reduce fall risk with ADLs.    Time 8    Period Weeks    Status New    Target Date 09/27/21                   Plan - 08/07/21 1227     Clinical Impression Statement Patient instructed in Berg balance assessment; He scored as a high risk for falls. Patient is able to stand unsupported for short time but does exhibit increased shakiness which is likely from weakness and imbalance. He continues to report no back pain. Patient presents to therapy with back brace on in better position. Patient does exhibit forward slumped posture. His daughter states that he is more slumped now than  prior to fall. He does have fixed kyphotic curve however he likely is more slumped due to trunk weakness and fear of falling. Patient instructed in advanced LE exercise as part of HEP. He does require min A for don/doff tband. He denies any pain with advanced exercise. He would benefit from additional skilled PT intervention to improve strength, balance and mobility;    Personal Factors and Comorbidities Comorbidity 3+;Age;Fitness    Comorbidities Prostate issues, incontinence, arthritis, thoracic compression fracture due to recent fall    Examination-Activity Limitations Bend;Carry;Locomotion Level;Squat;Stairs;Stand;Transfers    Examination-Participation Restrictions Cleaning;Community Activity;Driving;Meal Prep;Shop;Volunteer;Yard Work    Stability/Clinical Decision Making Stable/Uncomplicated    Rehab Potential Good    PT Frequency 2x / week    PT Duration 8 weeks    PT Treatment/Interventions ADLs/Self Care Home  Management;Cryotherapy;Moist Heat;Gait training;Stair training;Functional mobility training;Therapeutic exercise;Balance training;Neuromuscular re-education;Therapeutic activities;Patient/family education;Orthotic Fit/Training;Energy conservation    PT Next Visit Plan Further assess balance, initiate HEP, strengthening    PT Home Exercise Plan will address next session    Consulted and Agree with Plan of Care Patient             Patient will benefit from skilled therapeutic intervention in order to improve the following deficits and impairments:  Abnormal gait, Decreased balance, Decreased endurance, Decreased mobility, Difficulty walking, Decreased activity tolerance, Decreased strength, Postural dysfunction  Visit Diagnosis: Muscle weakness (generalized)  Unsteadiness on feet  Difficulty in walking, not elsewhere classified     Problem List Patient Active Problem List   Diagnosis Date Noted   Macrocytic anemia 07/18/2021   Benign prostatic hyperplasia (BPH) with urinary urge incontinence 07/18/2021   Compression fracture of thoracic vertebra ([place]) 06/16/2021   Constipation 06/16/2021   Weight loss 06/16/2021   Bilateral leg weakness 08/30/2020   Knee pain, chronic 07/18/2020   Iron deficiency anemia 03/02/2020   Fluid retention in legs 02/27/2020   Fatigue 02/27/2020    Adante Courington, PT, DPT 08/07/2021, 12:30 PM  [facility] MAIN [facility] SERVICES [address] [place], [place], [address] Phone: [phone]   Fax:  [phone]  Name: Alyjah Lovingood MRN: [id number] Date of Birth: 05-26-1921

## 2021-08-08 ENCOUNTER — Telehealth: Payer: Self-pay | Admitting: Internal Medicine

## 2021-08-08 DIAGNOSIS — K5792 Diverticulitis of intestine, part unspecified, without perforation or abscess without bleeding: Secondary | ICD-10-CM

## 2021-08-08 NOTE — Telephone Encounter (Signed)
Patients daughter has been informed.

## 2021-08-08 NOTE — Telephone Encounter (Signed)
Patient informed, Due to the high volume of calls and your symptoms we have to forward your call to our Triage Nurse to expedient your call. Please hold for the transfer.  Patient transferred to [facility] at [facility]. Due to having sinus issues with trouble breathing through his nose and feeling like he is going to pass out for the past two weeks but his symptoms are worsening this week.No openings in office or virtual.

## 2021-08-08 NOTE — Telephone Encounter (Signed)
Adriana from Access Nurse called in stating that the patient has been taking afrin and he has BPH and they are calling to see if it is ok for him to still take this medicine.She would like for the patients daughter Jani Files to be given a call at [phone].Please advise.

## 2021-08-08 NOTE — Telephone Encounter (Signed)
Access nurse instructed patient on home care but daughter wants to know if it is ok for patient to take afrin.

## 2021-08-09 ENCOUNTER — Ambulatory Visit: Payer: Medicare Other | Admitting: Physical Therapy

## 2021-08-09 ENCOUNTER — Encounter: Payer: Self-pay | Admitting: Physical Therapy

## 2021-08-09 ENCOUNTER — Other Ambulatory Visit: Payer: Self-pay

## 2021-08-09 DIAGNOSIS — R2681 Unsteadiness on feet: Secondary | ICD-10-CM

## 2021-08-09 DIAGNOSIS — M6281 Muscle weakness (generalized): Secondary | ICD-10-CM

## 2021-08-09 DIAGNOSIS — R262 Difficulty in walking, not elsewhere classified: Secondary | ICD-10-CM

## 2021-08-09 NOTE — Therapy (Signed)
[place] [facility] [address] [place], [place], [address] Phone: [phone]   Fax:  [phone]  Physical Therapy Treatment/Cancel  Patient Details  Name: Jason Powers MRN: [id number] Date of Birth: [birth year] Referring Provider (PT): Dr. Freddie Apley. Izora Ribas   Encounter Date: 08/09/2021   PT End of Session - 08/09/21 1040     Visit Number 2    Number of Visits 17    Date for PT Re-Evaluation 09/27/21    Authorization Type Medicare, start of care 08/02/21    PT Start Time 1028    PT Stop Time 1100    PT Time Calculation (min) 32 min    Equipment Utilized During Treatment --    Activity Tolerance --    Behavior During Therapy --             Past Medical History:  Diagnosis Date   COPD (chronic obstructive pulmonary disease) ([place])    Glaucoma    Hypertension     Past Surgical History:  Procedure Laterality Date   APPENDECTOMY      There were no vitals filed for this visit.   Subjective Assessment - 08/09/21 1039     Subjective "I need to use the bathroom" Caregiver reports he has been doing HEP with no issues;    Patient is accompained by: Family member   Daughters: linda and [place]   Pertinent History 87 yo Male s/p fall on 06/05/21 with subsequent T9 and T12 compression fracture. He was given TLSO brace which he is supposed to wear all the time when out of bed. However he is unable to use the bathroom with brace on and therefore will take it off. he lives alone and his daughters come and check on him 3-4 times a day. He is unable to don brace by himself and therefore he does move around some without brace on. Patient denies any back pain. He does have sacral discomfort with prolonged sitting due to loss of soft tissue. He does report difficulty with transfers and increased weakness in LE. He is currently using a rollator and requires it to help get up. Patient does express a fear of falling. He denies any  numbness/tingling; Daughters prepare his meals. He does require supervision for showers for safety. Daughters help with don socks but otherwise is modified independent in dressing; He does require assistance with don/doff back brace;    How long can you sit comfortably? needs a cushion- has sacral discomfort due to loss of soft tissue    How long can you stand comfortably? <10 min    How long can you walk comfortably? short distances    Diagnostic tests MRI on 8/17: Subacute T12 compression fracture with 50% height loss and 6 mm of  retropulsion that indents the ventral cord.     Subacute T9 compression fracture with mild height loss and no  retropulsion.    Patient Stated Goals "I want to accomplish to whatever I can" He wants to get back to driving, walk with cane (not use walker if possible);    Currently in Pain? No/denies                   Pt arrived for PT, he needed to use the bathroom and required entire session time for changing and using bathroom.   Will resume PT at next scheduled visit;  PT Education - 08/09/21 1040     Education Details positioning    Person(s) Educated Patient    Methods Explanation;Verbal cues    Comprehension Verbalized understanding;Returned demonstration;Verbal cues required;Need further instruction              PT Short Term Goals - 08/03/21 1158       PT SHORT TERM GOAL #1   Title Patient will be adherent with HEP at least 3x a week to increase LE strength and improve mobility.    Time 4    Period Weeks    Status New    Target Date 08/31/21      PT SHORT TERM GOAL #2   Title Patient will be modified independent in transferring sit<>Stand without pushing on arm rests of chair to demonstrate improved transfer ability and increase mobility.    Time 4    Period Weeks    Status New    Target Date 08/31/21      PT SHORT TERM GOAL #3   Title Patient will improve 10 meter walk speed to >0.7 m/s  to indicate improved gait safety for less fall risk and improved home ambulation.    Time 4    Period Weeks    Status New    Target Date 08/31/21               PT Long Term Goals - 08/03/21 1159       PT LONG TERM GOAL #1   Title Patient will improve BLE gross strength to >4+/5 to indicate improved functional strength for gait and standing ADLs.    Time 8    Period Weeks    Status New    Target Date 09/27/21      PT LONG TERM GOAL #2   Title Patient will improve 10 meter walk speed to >1.0 m/s with least restrictive assistive device to improve safety with home and community ambulation and reduce fall risk.    Time 8    Period Weeks    Status New    Target Date 09/27/21      PT LONG TERM GOAL #3   Title Patient will improve FOTO score to >50% to indicate improved functional mobility with ADLs.    Time 8    Period Weeks    Status New    Target Date 09/27/21      PT LONG TERM GOAL #4   Title Patient will reduce timed up and go to <14 sec with least restrictive assistive device to reduce fall risk and improve overall mobility.    Time 8    Period Weeks    Status New    Target Date 09/27/21      PT LONG TERM GOAL #5   Title Patient will ambulate short distances on level surface with SPC modified independent exhibiting good safety awareness to improve mobility and safety in home.    Time 8    Period Weeks    Status New    Target Date 09/27/21      Additional Long Term Goals   Additional Long Term Goals Yes      PT LONG TERM GOAL #6   Title Patient will improve Berg balance score to >47/56 to reduce fall risk with ADLs.    Time 8    Period Weeks    Status New    Target Date 09/27/21  Plan - 08/09/21 1045     Clinical Impression Statement Patient presents to therapy motivated. However patient needed to use the restroom at the start of therapy; While in the bathroom he requested a new pull up; Patient used entire session for bathroom  needs. Skilled intervention was withheld this session due to time constraints. Recommend patient continue with HEP as previously given. Patient would benefit from additional skilled PT Intervention to improve strength, balance and mobility;    Personal Factors and Comorbidities Comorbidity 3+;Age;Fitness    Comorbidities Prostate issues, incontinence, arthritis, thoracic compression fracture due to recent fall    Examination-Activity Limitations Bend;Carry;Locomotion Level;Squat;Stairs;Stand;Transfers    Examination-Participation Restrictions Cleaning;Community Activity;Driving;Meal Prep;Shop;Volunteer;Yard Work    Stability/Clinical Decision Making Stable/Uncomplicated    Rehab Potential Good    PT Frequency 2x / week    PT Duration 8 weeks    PT Treatment/Interventions ADLs/Self Care Home Management;Cryotherapy;Moist Heat;Gait training;Stair training;Functional mobility training;Therapeutic exercise;Balance training;Neuromuscular re-education;Therapeutic activities;Patient/family education;Orthotic Fit/Training;Energy conservation    PT Next Visit Plan Further assess balance, initiate HEP, strengthening    PT Home Exercise Plan will address next session    Consulted and Agree with Plan of Care Patient             Patient will benefit from skilled therapeutic intervention in order to improve the following deficits and impairments:  Abnormal gait, Decreased balance, Decreased endurance, Decreased mobility, Difficulty walking, Decreased activity tolerance, Decreased strength, Postural dysfunction  Visit Diagnosis: Muscle weakness (generalized)  Unsteadiness on feet  Difficulty in walking, not elsewhere classified     Problem List Patient Active Problem List   Diagnosis Date Noted   Macrocytic anemia 07/18/2021   Benign prostatic hyperplasia (BPH) with urinary urge incontinence 07/18/2021   Compression fracture of thoracic vertebra ([place]) 06/16/2021   Constipation 06/16/2021    Weight loss 06/16/2021   Bilateral leg weakness 08/30/2020   Knee pain, chronic 07/18/2020   Iron deficiency anemia 03/02/2020   Fluid retention in legs 02/27/2020   Fatigue 02/27/2020    Stavros Cail, PT, DPT 08/09/2021, 1:09 PM  [facility] [address] [place], [place], [address] Phone: [phone]   Fax:  [phone]  Name: Jason Powers MRN: [id number] Date of Birth: 1920/12/25

## 2021-08-10 ENCOUNTER — Other Ambulatory Visit: Payer: Self-pay

## 2021-08-10 ENCOUNTER — Observation Stay
Admission: EM | Admit: 2021-08-10 | Discharge: 2021-08-11 | Disposition: A | Discharge status: Home / Self Care | Payer: Medicare Other | Attending: Hospitalist | Admitting: Hospitalist

## 2021-08-10 ENCOUNTER — Emergency Department: Payer: Medicare Other

## 2021-08-10 DIAGNOSIS — R0689 Other abnormalities of breathing: Secondary | ICD-10-CM | POA: Diagnosis not present

## 2021-08-10 DIAGNOSIS — J449 Chronic obstructive pulmonary disease, unspecified: Secondary | ICD-10-CM | POA: Diagnosis not present

## 2021-08-10 DIAGNOSIS — K5732 Diverticulitis of large intestine without perforation or abscess without bleeding: Secondary | ICD-10-CM | POA: Diagnosis present

## 2021-08-10 DIAGNOSIS — I4891 Unspecified atrial fibrillation: Secondary | ICD-10-CM | POA: Insufficient documentation

## 2021-08-10 DIAGNOSIS — R109 Unspecified abdominal pain: Secondary | ICD-10-CM | POA: Diagnosis not present

## 2021-08-10 DIAGNOSIS — R6889 Other general symptoms and signs: Secondary | ICD-10-CM | POA: Diagnosis not present

## 2021-08-10 DIAGNOSIS — I499 Cardiac arrhythmia, unspecified: Secondary | ICD-10-CM | POA: Diagnosis not present

## 2021-08-10 DIAGNOSIS — I10 Essential (primary) hypertension: Secondary | ICD-10-CM | POA: Diagnosis present

## 2021-08-10 DIAGNOSIS — D62 Acute posthemorrhagic anemia: Secondary | ICD-10-CM | POA: Diagnosis present

## 2021-08-10 DIAGNOSIS — S22000A Wedge compression fracture of unspecified thoracic vertebra, initial encounter for closed fracture: Secondary | ICD-10-CM | POA: Diagnosis present

## 2021-08-10 DIAGNOSIS — Z79899 Other long term (current) drug therapy: Secondary | ICD-10-CM | POA: Diagnosis not present

## 2021-08-10 DIAGNOSIS — Z743 Need for continuous supervision: Secondary | ICD-10-CM | POA: Diagnosis not present

## 2021-08-10 DIAGNOSIS — D649 Anemia, unspecified: Secondary | ICD-10-CM | POA: Insufficient documentation

## 2021-08-10 DIAGNOSIS — K922 Gastrointestinal hemorrhage, unspecified: Secondary | ICD-10-CM | POA: Diagnosis not present

## 2021-08-10 DIAGNOSIS — R1084 Generalized abdominal pain: Secondary | ICD-10-CM | POA: Diagnosis present

## 2021-08-10 DIAGNOSIS — K921 Melena: Secondary | ICD-10-CM | POA: Diagnosis not present

## 2021-08-10 DIAGNOSIS — Z20822 Contact with and (suspected) exposure to covid-19: Secondary | ICD-10-CM | POA: Insufficient documentation

## 2021-08-10 DIAGNOSIS — K625 Hemorrhage of anus and rectum: Secondary | ICD-10-CM | POA: Diagnosis not present

## 2021-08-10 DIAGNOSIS — Z87891 Personal history of nicotine dependence: Secondary | ICD-10-CM | POA: Insufficient documentation

## 2021-08-10 DIAGNOSIS — R197 Diarrhea, unspecified: Secondary | ICD-10-CM | POA: Diagnosis not present

## 2021-08-10 DIAGNOSIS — S22080A Wedge compression fracture of T11-T12 vertebra, initial encounter for closed fracture: Secondary | ICD-10-CM | POA: Diagnosis not present

## 2021-08-10 LAB — C DIFFICILE QUICK SCREEN W PCR REFLEX
C Diff antigen: NEGATIVE
C Diff interpretation: NOT DETECTED
C Diff toxin: NEGATIVE

## 2021-08-10 LAB — URINALYSIS, COMPLETE (UACMP) WITH MICROSCOPIC
Bilirubin Urine: NEGATIVE
Glucose, UA: NEGATIVE mg/dL
Hgb urine dipstick: NEGATIVE
Ketones, ur: NEGATIVE mg/dL
Leukocytes,Ua: NEGATIVE
Nitrite: NEGATIVE
Protein, ur: NEGATIVE mg/dL
Specific Gravity, Urine: 1.036 — ABNORMAL HIGH (ref 1.005–1.030)
pH: 5 (ref 5.0–8.0)

## 2021-08-10 LAB — CBC WITH DIFFERENTIAL/PLATELET
Abs Immature Granulocytes: 0.09 10*3/uL — ABNORMAL HIGH (ref 0.00–0.07)
Basophils Absolute: 0.1 10*3/uL (ref 0.0–0.1)
Basophils Relative: 1 %
Eosinophils Absolute: 0 10*3/uL (ref 0.0–0.5)
Eosinophils Relative: 0 %
HCT: 23.6 % — ABNORMAL LOW (ref 39.0–52.0)
Hemoglobin: 8.1 g/dL — ABNORMAL LOW (ref 13.0–17.0)
Immature Granulocytes: 1 %
Lymphocytes Relative: 7 %
Lymphs Abs: 0.7 10*3/uL (ref 0.7–4.0)
MCH: 36.2 pg — ABNORMAL HIGH (ref 26.0–34.0)
MCHC: 34.3 g/dL (ref 30.0–36.0)
MCV: 105.4 fL — ABNORMAL HIGH (ref 80.0–100.0)
Monocytes Absolute: 0.8 10*3/uL (ref 0.1–1.0)
Monocytes Relative: 8 %
Neutro Abs: 8.5 10*3/uL — ABNORMAL HIGH (ref 1.7–7.7)
Neutrophils Relative %: 83 %
Platelets: 240 10*3/uL (ref 150–400)
RBC: 2.24 MIL/uL — ABNORMAL LOW (ref 4.22–5.81)
RDW: 15.9 % — ABNORMAL HIGH (ref 11.5–15.5)
WBC: 10.1 10*3/uL (ref 4.0–10.5)
nRBC: 0 % (ref 0.0–0.2)

## 2021-08-10 LAB — PREPARE RBC (CROSSMATCH)

## 2021-08-10 LAB — LIPASE, BLOOD: Lipase: 48 U/L (ref 11–51)

## 2021-08-10 LAB — PROTIME-INR
INR: 1.2 (ref 0.8–1.2)
Prothrombin Time: 15 seconds (ref 11.4–15.2)

## 2021-08-10 LAB — COMPREHENSIVE METABOLIC PANEL
ALT: 15 U/L (ref 0–44)
AST: 21 U/L (ref 15–41)
Albumin: 3.1 g/dL — ABNORMAL LOW (ref 3.5–5.0)
Alkaline Phosphatase: 38 U/L (ref 38–126)
Anion gap: 10 (ref 5–15)
BUN: 43 mg/dL — ABNORMAL HIGH (ref 8–23)
CO2: 30 mmol/L (ref 22–32)
Calcium: 8.5 mg/dL — ABNORMAL LOW (ref 8.9–10.3)
Chloride: 100 mmol/L (ref 98–111)
Creatinine, Ser: 1.12 mg/dL (ref 0.61–1.24)
GFR, Estimated: 59 mL/min — ABNORMAL LOW (ref >60)
Glucose, Bld: 162 mg/dL — ABNORMAL HIGH (ref 70–99)
Potassium: 3.8 mmol/L (ref 3.5–5.1)
Sodium: 140 mmol/L (ref 135–145)
Total Bilirubin: 1.2 mg/dL (ref 0.3–1.2)
Total Protein: 5 g/dL — ABNORMAL LOW (ref 6.5–8.1)

## 2021-08-10 LAB — FERRITIN: Ferritin: 195 ng/mL (ref 24–336)

## 2021-08-10 LAB — APTT: aPTT: 32 seconds (ref 24–36)

## 2021-08-10 LAB — RESP PANEL BY RT-PCR (FLU A&B, COVID) ARPGX2
Influenza A by PCR: NEGATIVE
Influenza B by PCR: NEGATIVE
SARS Coronavirus 2 by RT PCR: NEGATIVE

## 2021-08-10 LAB — HEMOGLOBIN AND HEMATOCRIT, BLOOD
HCT: 28.1 % — ABNORMAL LOW (ref 39.0–52.0)
Hemoglobin: 9.5 g/dL — ABNORMAL LOW (ref 13.0–17.0)

## 2021-08-10 LAB — ABO/RH: ABO/RH(D): O NEG

## 2021-08-10 MED ORDER — POLYSACCHARIDE IRON COMPLEX 150 MG PO CAPS
150.0000 mg | ORAL_CAPSULE | Freq: Every day | ORAL | Status: DC
  Filled 2021-08-10: qty 1

## 2021-08-10 MED ORDER — PIPERACILLIN-TAZOBACTAM 3.375 G IVPB
3.3750 g | Freq: Three times a day (TID) | INTRAVENOUS | Status: DC
  Administered 2021-08-10 – 2021-08-11 (×3): 3.375 g via INTRAVENOUS
  Filled 2021-08-10 (×3): qty 50

## 2021-08-10 MED ORDER — ONDANSETRON HCL 4 MG PO TABS: 4.0000 mg | ORAL_TABLET | Freq: Four times a day (QID) | ORAL | Status: DC | PRN

## 2021-08-10 MED ORDER — IOHEXOL 350 MG/ML SOLN
80.0000 mL | Freq: Once | INTRAVENOUS | Status: AC | PRN
  Administered 2021-08-10: 80 mL via INTRAVENOUS

## 2021-08-10 MED ORDER — ACETAMINOPHEN 325 MG PO TABS: 650.0000 mg | ORAL_TABLET | Freq: Four times a day (QID) | ORAL | Status: DC | PRN

## 2021-08-10 MED ORDER — FERROUS FUM-IRON POLYSACCH 162-115.2 MG PO CAPS: 1.0000 | ORAL_CAPSULE | Freq: Every day | ORAL | Status: DC

## 2021-08-10 MED ORDER — PANTOPRAZOLE SODIUM 40 MG PO TBEC
40.0000 mg | DELAYED_RELEASE_TABLET | Freq: Two times a day (BID) | ORAL | Status: DC
  Administered 2021-08-10 – 2021-08-11 (×2): 40 mg via ORAL
  Filled 2021-08-10 (×2): qty 1

## 2021-08-10 MED ORDER — LACTATED RINGERS IV BOLUS
1000.0000 mL | Freq: Once | INTRAVENOUS | Status: AC
  Administered 2021-08-10: 1000 mL via INTRAVENOUS

## 2021-08-10 MED ORDER — ONDANSETRON HCL 4 MG/2ML IJ SOLN: 4.0000 mg | Freq: Four times a day (QID) | INTRAMUSCULAR | Status: DC | PRN

## 2021-08-10 MED ORDER — FUROSEMIDE 10 MG/ML IJ SOLN
20.0000 mg | Freq: Once | INTRAMUSCULAR | Status: DC
  Filled 2021-08-10: qty 4

## 2021-08-10 MED ORDER — BRINZOLAMIDE 1 % OP SUSP
1.0000 [drp] | Freq: Two times a day (BID) | OPHTHALMIC | Status: DC
  Administered 2021-08-10 – 2021-08-11 (×2): 1 [drp] via OPHTHALMIC
  Filled 2021-08-10: qty 10

## 2021-08-10 MED ORDER — SODIUM CHLORIDE 0.9 % IV SOLN
10.0000 mL/h | Freq: Once | INTRAVENOUS | Status: AC
  Administered 2021-08-10: 10 mL/h via INTRAVENOUS

## 2021-08-10 MED ORDER — TAMSULOSIN HCL 0.4 MG PO CAPS
0.4000 mg | ORAL_CAPSULE | Freq: Every day | ORAL | Status: DC
  Administered 2021-08-10 – 2021-08-11 (×2): 0.4 mg via ORAL
  Filled 2021-08-10 (×2): qty 1

## 2021-08-10 MED ORDER — SODIUM CHLORIDE 0.9 % IV SOLN: INTRAVENOUS | Status: DC

## 2021-08-10 MED ORDER — ACETAMINOPHEN 325 MG RE SUPP: 650.0000 mg | Freq: Four times a day (QID) | RECTAL | Status: DC | PRN

## 2021-08-10 MED ORDER — FERROUS FUMARATE 324 (106 FE) MG PO TABS
1.0000 | ORAL_TABLET | Freq: Every day | ORAL | Status: DC
  Filled 2021-08-10: qty 1

## 2021-08-10 NOTE — ED Notes (Signed)
Patient taken to toilet.  Transferred with assist.  Had about 200 ml dark red jelly stool.  Stool specimen sent to lab.

## 2021-08-10 NOTE — ED Notes (Signed)
BBID [id number] applied to this pt's R wrist at this time.  Blood consent form signed by this pt, witnessed by this RN, and placed in this pt's chart.

## 2021-08-10 NOTE — Consult Note (Signed)
Jason Darby, MD [address]  [place]  [place], [place] [address]  Main: [phone]  Fax: [phone] Pager: [phone]   Consultation  Referring Provider:     No ref. provider found Primary Care Physician:  Crecencio Mc, MD Primary Gastroenterologist: Althia Forts         Reason for Consultation:   Rectal bleeding  Date of Admission:  08/10/2021 Date of Consultation:  08/10/2021         HPI:   Jason Powers is a 87 y.o. male with history of hypertension, COPD, not on home oxygen who is fairly independent of ADLs, lives alone presented with generalized abdominal pain and 2 days of diarrhea. In the ER patient had witnessed episode of 200 mL of dark red jelly stool, was negative for C. difficile.  His hemoglobin dropped from baseline of 11.9 on 07/18/2021 to 8.  Underwent CT angio which did not localize bleeding source, found to have sigmoid diverticulitis without perforation as well as diffuse colonic diverticulosis.  Patient is started on antibiotics, received 1 unit of PRBCs and GI is consulted to evaluate for rectal bleeding.  He does not have leukocytosis, denies any fever.  When I saw the patient, his daughter was also bedside.  Patient reported that his abdominal pain moved to left lower quadrant.  He did not have further episodes of bowel movements.  He would like to have something to drink.  He is taking oral iron as outpatient. He has been hemodynamically stable   NSAIDs: None  Antiplts/Anticoagulants/Anti thrombotics: None  GI Procedures: None  Past Medical History:  Diagnosis Date   COPD (chronic obstructive pulmonary disease) ([facility])    Glaucoma    Hypertension     Past Surgical History:  Procedure Laterality Date   APPENDECTOMY      Prior to Admission medications   Medication Sig Start Date End Date Taking? Authorizing Provider  Acetaminophen 325 MG CAPS Take by mouth.   Yes [provider]  brinzolamide (AZOPT) 1 % ophthalmic  suspension 1 drop 2 (two) times daily.   Yes [provider]  ferrous fumarate-iron polysaccharide complex (TANDEM) 162-115.2 MG CAPS capsule Take 1 capsule by mouth daily with breakfast. 03/02/20  Yes Crecencio Mc, MD  hydrochlorothiazide (HYDRODIURIL) 25 MG tablet TAKE 1 TABLET BY MOUTH ONCE A DAY 05/23/21  Yes Crecencio Mc, MD  magnesium hydroxide (MILK OF MAGNESIA) 400 MG/5ML suspension Take by mouth daily as needed for mild constipation.   Yes [provider]  tamsulosin (FLOMAX) 0.4 MG CAPS capsule Take 1 capsule (0.4 mg total) by mouth daily. 07/18/21  Yes Leone Haven, MD    Current Facility-Administered Medications:    0.9 %  sodium chloride infusion, , Intravenous, Continuous, Agbata, Tochukwu, MD, Last Rate: 100 mL/hr at 08/10/21 1343, New Bag at 08/10/21 1343   acetaminophen (TYLENOL) tablet 650 mg, 650 mg, Oral, Q6H PRN **OR** acetaminophen (TYLENOL) suppository 650 mg, 650 mg, Rectal, Q6H PRN, Agbata, Tochukwu, MD   brinzolamide (AZOPT) 1 % ophthalmic suspension 1 drop, 1 drop, Both Eyes, BID, Agbata, Tochukwu, MD   furosemide (LASIX) injection 20 mg, 20 mg, Intravenous, Once, Agbata, Tochukwu, MD   ondansetron (ZOFRAN) tablet 4 mg, 4 mg, Oral, Q6H PRN **OR** ondansetron (ZOFRAN) injection 4 mg, 4 mg, Intravenous, Q6H PRN, Agbata, Tochukwu, MD   pantoprazole (PROTONIX) EC tablet 40 mg, 40 mg, Oral, BID AC, Jason Powers, Tally Due, MD   piperacillin-tazobactam (ZOSYN) IVPB 3.375 g, 3.375 g,  Intravenous, Q8H, Agbata, Tochukwu, MD, Last Rate: 12.5 mL/hr at 08/10/21 1344, 3.375 g at 08/10/21 1344   tamsulosin (FLOMAX) capsule 0.4 mg, 0.4 mg, Oral, Daily, Agbata, Tochukwu, MD, 0.4 mg at 08/10/21 1345  Current Outpatient Medications:    Acetaminophen 325 MG CAPS, Take by mouth., Disp: , Rfl:    brinzolamide (AZOPT) 1 % ophthalmic suspension, 1 drop 2 (two) times daily., Disp: , Rfl:    ferrous fumarate-iron polysaccharide complex (TANDEM) 162-115.2 MG CAPS capsule,  Take 1 capsule by mouth daily with breakfast., Disp: 30 capsule, Rfl: 2   hydrochlorothiazide (HYDRODIURIL) 25 MG tablet, TAKE 1 TABLET BY MOUTH ONCE A DAY, Disp: 90 tablet, Rfl: 1   magnesium hydroxide (MILK OF MAGNESIA) 400 MG/5ML suspension, Take by mouth daily as needed for mild constipation., Disp: , Rfl:    tamsulosin (FLOMAX) 0.4 MG CAPS capsule, Take 1 capsule (0.4 mg total) by mouth daily., Disp: 30 capsule, Rfl: 3  Family History  Problem Relation Age of Onset   Drug abuse Brother    Cancer Brother      Social History   Tobacco Use   Smoking status: Former    Types: Cigarettes    Quit date: 1964    Years since quitting: 58.7   Smokeless tobacco: Never  Vaping Use   Vaping Use: Never used  Substance Use Topics   Alcohol use: No   Drug use: No    Allergies as of 08/10/2021   (No Known Allergies)    Review of Systems:    All systems reviewed and negative except where noted in HPI.   Physical Exam:  Vital signs in last 24 hours: Temp:  [97.7 F (36.5 C)-98.6 F (37 C)] 98.6 F (37 C) (09/29 1500) Pulse Rate:  [74-100] 82 (09/29 1500) Resp:  [13-29] 19 (09/29 1500) BP: (82-116)/(48-75) 107/56 (09/29 1500) SpO2:  [92 %-100 %] 98 % (09/29 1500) Weight:  [58.7 kg] 58.7 kg (09/29 0836)   General:   Pleasant, cooperative in NAD, frail-appearing Head:  Normocephalic and atraumatic, bitemporal wasting. Eyes:   No icterus.   Conjunctiva pink. PERRLA. Ears:  Normal auditory acuity. Neck:  Supple; no masses or thyroidomegaly Lungs: Respirations even and unlabored. Lungs clear to auscultation bilaterally.   No wheezes, crackles, or rhonchi.  Heart:  Regular rate and rhythm;  Without murmur, clicks, rubs or gallops Abdomen:  Soft, nondistended, mild left lower quadrant tenderness. Normal bowel sounds. No appreciable masses or hepatomegaly.  No rebound or guarding.  Rectal:  Not performed. Msk:  Symmetrical without gross deformities.  Strength generalized  weakness Extremities:  Without edema, cyanosis or clubbing. Neurologic:  Alert and oriented x3;  grossly normal neurologically. Skin:  Intact without significant lesions or rashes. Psych:  Alert and cooperative. Normal affect.  LAB RESULTS: CBC Latest Ref Rng & Units 08/10/2021 07/18/2021 06/16/2021  WBC 4.0 - 10.5 K/uL 10.1 6.2 7.0  Hemoglobin 13.0 - 17.0 g/dL 8.1(L) 11.9(L) 12.0(L)  Hematocrit 39.0 - 52.0 % 23.6(L) 36.7(L) 35.6(L)  Platelets 150 - 400 K/uL 240 251.0 307.0    BMET BMP Latest Ref Rng & Units 08/10/2021 06/16/2021 03/21/2021  Glucose 70 - 99 mg/dL 162(H) 112(H) 106(H)  BUN 8 - 23 mg/dL 43(H) 29(H) 28(H)  Creatinine 0.61 - 1.24 mg/dL 1.12 1.26 1.01  BUN/Creat Ratio 6 - 22 (calc) - - -  Sodium 135 - 145 mmol/L 140 134(L) 141  Potassium 3.5 - 5.1 mmol/L 3.8 3.7 4.0  Chloride 98 - 111 mmol/L 100 94(L) 104  CO2 22 - 32 mmol/L _0 Calcium 8.9 - 10.3 mg/dL 8.5(L) 9.0 9.0    LFT Hepatic Function Latest Ref Rng & Units 08/10/2021 06/16/2021 03/21/2021  Total Protein 6.5 - 8.1 g/dL 5.0(L) 6.7 6.6  Albumin 3.5 - 5.0 g/dL 3.1(L) 3.7 4.0  AST 15 - 41 U/L _1 ALT 0 - 44 U/L _2 Alk Phosphatase 38 - 126 U/L 38 65 45  Total Bilirubin 0.3 - 1.2 mg/dL 1.2 0.7 0.5     STUDIES: CT ANGIO GI BLEED  Result Date: 08/10/2021 CLINICAL DATA:  Generalized abdominal pain and diarrhea. Dark red stool. Concern for GI bleed. Mesenteric ischemia, acute EXAM: CTA ABDOMEN AND PELVIS WITHOUT AND WITH CONTRAST TECHNIQUE: Multidetector CT imaging of the abdomen and pelvis was performed using the standard protocol during bolus administration of intravenous contrast. Multiplanar reconstructed images and MIPs were obtained and reviewed to evaluate the vascular anatomy. CONTRAST:  29m OMNIPAQUE IOHEXOL 350 MG/ML SOLN COMPARISON:  Lumbar spine x-ray 06/13/2021. FINDINGS: VASCULAR Aorta: Moderate calcified and noncalcified atherosclerotic plaque throughout the abdominal aorta without aneurysm or  evidence of significant stenosis. Celiac: Atherosclerosis of the celiac trunk and its branches. Patent without evidence of aneurysm, dissection, vasculitis or significant stenosis. SMA: Mild atherosclerosis. Patent without evidence of aneurysm, dissection, vasculitis or significant stenosis. Renals: Atherosclerosis at the bilateral renal artery ostia. Both renal arteries are patent without evidence of aneurysm, dissection, vasculitis, fibromuscular dysplasia or significant stenosis. IMA: Patent. Inflow: Moderate atherosclerotic plaque. Patent without evidence of aneurysm, dissection, vasculitis or significant stenosis. Proximal Outflow: Moderate atherosclerotic plaque. Bilateral common femoral and visualized portions of the superficial and profunda femoral arteries are patent without evidence of aneurysm, dissection, vasculitis or significant stenosis. Veins: Major venous structures are patent. Review of the MIP images confirms the above findings. NON-VASCULAR Lower chest: Emphysematous changes within the visualized lung bases. Heart size is normal. Relative hypoattenuation of the cardiac blood pool indicative of anemia. Hepatobiliary: No focal liver abnormality is seen. No gallstones, gallbladder wall thickening, or biliary dilatation. Pancreas: Unremarkable. No pancreatic ductal dilatation or surrounding inflammatory changes. Spleen: Normal in size without focal abnormality. Adrenals/Urinary Tract: Unremarkable adrenal glands. Kidneys are mildly atrophic. Small punctate calcification in the left renal pelvis, likely vascular calcification. No definite renal calculi. No hydronephrosis. Urinary bladder wall is mildly thickened. There is a diverticulum emanating laterally at the left side of the bladder wall. Stomach/Bowel: Stomach is within normal limits. No dilated loops of bowel to suggest obstruction. There is a focally inflamed diverticula within the mid sigmoid colon with adjacent fat stranding (series 6, image  61. No pericolonic fluid collection or abscess. No extraluminal air. Numerous additional diverticula throughout the colon. Appendix is not visualized, surgically absent by history. Multiphasic images demonstrate no evidence of active GI bleed. No contrast is seen pooling within bowel. Lymphatic: No abdominopelvic lymphadenopathy. Reproductive: Markedly enlarged, heterogeneous prostate gland resulting in significant mass effect upon the base of the urinary bladder. Prostate gland measures approximately 6.0 x 5.1 x 5.8 cm. Other: No free fluid. No abdominopelvic fluid collection. No pneumoperitoneum. Small fat containing inguinal hernias. Musculoskeletal: Severe compression fracture of the T12 vertebral body with approximately 8 mm of retropulsion of the superior endplate likely resulting in at least moderate canal stenosis at this level (series 13, image 91). IMPRESSION: VASCULAR 1. No evidence of active GI bleed or large vessel occlusion. 2. Moderate atherosclerotic disease throughout the aortoiliac axis without aneurysm or dissection. (ICD10-I70.0) NON-VASCULAR 1. Acute uncomplicated  sigmoid diverticulitis. 2. Severe compression fracture of the T12 vertebral body with approximately 8 mm of retropulsion. This likely results in at least moderate canal stenosis at this level. Degree of vertebral body height loss has progressed from 06/13/2021. 3. Markedly enlarged, heterogeneous prostate gland resulting in significant mass effect upon the base of the urinary bladder. 4. Mildly thickened urinary bladder wall is likely secondary to chronic outlet obstruction. Electronically Signed   By: Davina Poke D.O.   On: 08/10/2021 10:34      Impression / Plan:   Jason Powers is a 87 y.o. male with history of COPD not on home oxygen, hypertension who is independent of ADLs is admitted with acute uncomplicated sigmoid diverticulitis and hematochezia with acute blood loss anemia  Hematochezia: Likely in the setting of  diverticulitis BUN/creatinine ratio is elevated, however low suspicion for upper GI source Start Protonix 40 mg p.o. twice daily Patient received 1 unit of PRBCs, monitor CBC closely If patient develops further episodes of rectal bleeding, recommend nuclear medicine bleeding scan to evaluate for diverticular bleed Do not recommend colonoscopy in the setting of acute diverticulitis  Acute uncomplicated sigmoid diverticulitis Okay to continue antibiotics Okay with liquid diet  Acute diarrhea Stool for C. difficile negative If diarrhea persists, recommend to rule out other infectious etiology   Thank you for involving me in the care of this patient.      LOS: 0 days   Sherri Sear, MD  08/10/2021, 5:06 PM    Note: This dictation was prepared with Dragon dictation along with smaller phrase technology. Any transcriptional errors that result from this process are unintentional.

## 2021-08-10 NOTE — ED Notes (Signed)
Pt changed and back in bed

## 2021-08-10 NOTE — H&P (Signed)
History and Physical    Jason Powers QMV:[id number] DOB: 02-09-1921 DOA: 08/10/2021  PCP: Crecencio Mc, MD   Patient coming from: Home  I have personally briefly reviewed patient's old medical records in [place]  Chief Complaint: Feeling unwell  HPI: Jason Powers is a 87 y.o. male with medical history significant for hypertension, COPD, glaucoma who presents to the ER via EMS for evaluation of abdominal pain and diarrhea which he has had for about 24 hours. Patient states that he was in his usual state of health until 1 day prior to his admission when he developed multiple episodes of loose watery stools.  He states that he did not check his stools and is unable to tell me if he had blood in it or not.  He denies recent antibiotic use. He called his daughter on the morning of his admission and told that he did not feel well and so she called EMS.  When EMS arrived patient was found to be in A. fib and had room air pulse oximetry of 85% and was placed on 4 L of oxygen. He complains of abdominal pain mostly in the left lower quadrant which he rates a 5 x 10 in intensity at its worst.  Pain is nonradiating.  He denies having any nausea or vomiting.  He denies having any fever or chills, no dizziness, no lightheadedness, no falls.  He denies NSAID use.  He denies having any urinary symptoms, no lower extremity swelling, no headache, no cough or blurred vision or focal deficit. Labs show sodium 140, potassium 3.8, chloride 100, bicarb 30, glucose 162, BUN 43, creatinine 1.2, calcium 8.5, alkaline phosphatase 78, albumin 3.1, lipase 48, AST 21, ALT 15, total protein 5.0, white count 10.1, hemoglobin 8.1 from 11.9 about 3 weeks ago, hematocrit 23.6, MCV 105, RDW 15.9, platelet count 240, PT 15.0, INR 1.2 Stool is negative for C. Difficile CT angiogram shows acute uncomplicated sigmoid diverticulitis.  Severe compression fracture of the T12 vertebral body with approximately 8 mm of  retropulsion.  Markedly enlarged heterogeneous prostate gland resulting in significant mass-effect upon the base of the urinary bladder. No evidence of active GI bleed or large vessel occlusion. Twelve-lead EKG reviewed by me shows an ectopic atrial rhythm with PVCs and nonspecific T wave abnormalities    ED Course: Patient is a 87 year old Caucasian male who at baseline lives alone and is able to carry out most of his activities of daily living.  He was brought into the ER by EMS for evaluation of abdominal pain and diarrhea and while in the emergency room he had an episode of maroon-colored stool with a drop in his blood pressure. Patient noted to have a drop in his H&H from a baseline of 11.9 >> 8.1. ER physician has ordered transfusion of 2 units of packed RBC. Imaging does not show any evidence of acute bleed but shows acute uncomplicated sigmoid diverticulitis. He will be admitted to the hospital for further evaluation.     Review of Systems: As per HPI otherwise all other systems reviewed and negative.    Past Medical History:  Diagnosis Date   COPD (chronic obstructive pulmonary disease) ([place])    Glaucoma    Hypertension     Past Surgical History:  Procedure Laterality Date   APPENDECTOMY       reports that he quit smoking about 58 years ago. His smoking use included cigarettes. He has never used smokeless tobacco. He reports that he does not  drink alcohol and does not use drugs.  No Known Allergies  Family History  Problem Relation Age of Onset   Drug abuse Brother    Cancer Brother       Prior to Admission medications   Medication Sig Start Date End Date Taking? Authorizing Provider  Acetaminophen 325 MG CAPS Take by mouth.   Yes [provider]  brinzolamide (AZOPT) 1 % ophthalmic suspension 1 drop 2 (two) times daily.   Yes [provider]  ferrous fumarate-iron polysaccharide complex (TANDEM) 162-115.2 MG CAPS capsule Take 1 capsule by mouth  daily with breakfast. 03/02/20  Yes Crecencio Mc, MD  hydrochlorothiazide (HYDRODIURIL) 25 MG tablet TAKE 1 TABLET BY MOUTH ONCE A DAY 05/23/21  Yes Crecencio Mc, MD  magnesium hydroxide (MILK OF MAGNESIA) 400 MG/5ML suspension Take by mouth daily as needed for mild constipation.   Yes [provider]  tamsulosin (FLOMAX) 0.4 MG CAPS capsule Take 1 capsule (0.4 mg total) by mouth daily. 07/18/21  Yes Leone Haven, MD    Physical Exam: Vitals:   08/10/21 0836 08/10/21 0840 08/10/21 0900 08/10/21 1000  BP:  (!) 106/57 (!) 96/55 (!) 104/52  Pulse:  95 89 76  Resp:  17 (!) 29 13  Temp:  97.7 F (36.5 C)    TempSrc:  Oral    SpO2:  96% 100% 99%  Weight: 58.7 kg     Height: 5\' 8"  (1.727 m)        Vitals:   08/10/21 0836 08/10/21 0840 08/10/21 0900 08/10/21 1000  BP:  (!) 106/57 (!) 96/55 (!) 104/52  Pulse:  95 89 76  Resp:  17 (!) 29 13  Temp:  97.7 F (36.5 C)    TempSrc:  Oral    SpO2:  96% 100% 99%  Weight: 58.7 kg     Height: 5\' 8"  (1.727 m)         Constitutional: Alert and oriented x 3 . Not in any apparent distress HEENT:      Head: Normocephalic and atraumatic.         Eyes: PERLA, EOMI, Conjunctivae pallor. Sclera is non-icteric       Mouth/Throat: Mucous membranes are moist.       Neck: Supple with no signs of meningismus. Cardiovascular: Regular rate and rhythm. No murmurs, gallops, or rubs. 2+ symmetrical distal pulses are present . No JVD. No LE edema Respiratory: Respiratory effort normal .Lungs sounds clear bilaterally. No wheezes, crackles, or rhonchi.  Gastrointestinal: Soft, left lower quadrant tenderness, and non distended with positive bowel sounds.  Genitourinary: No CVA tenderness. Musculoskeletal: Nontender with normal range of motion in all extremities. No cyanosis, or erythema of extremities. Neurologic:  Face is symmetric. Moving all extremities. No gross focal neurologic deficits . Skin: Skin is warm, dry.  Ecchymosis involving  both hands Psychiatric: Mood and affect are normal    Labs on Admission: I have personally reviewed following labs and imaging studies  CBC: Recent Labs  Lab 08/10/21 0842  WBC 10.1  NEUTROABS 8.5*  HGB 8.1*  HCT 23.6*  MCV 105.4*  PLT 488   Basic Metabolic Panel: Recent Labs  Lab 08/10/21 0842  NA 140  K 3.8  CL 100  CO2 30  GLUCOSE 162*  BUN 43*  CREATININE 1.12  CALCIUM 8.5*   GFR: Estimated Creatinine Clearance: 30.6 mL/min (by C-G formula based on SCr of 1.12 mg/dL). Liver Function Tests: Recent Labs  Lab 08/10/21 0842  AST 21  ALT 15  ALKPHOS 38  BILITOT 1.2  PROT 5.0*  ALBUMIN 3.1*   Recent Labs  Lab 08/10/21 0842  LIPASE 48   No results for input(s): AMMONIA in the last 168 hours. Coagulation Profile: Recent Labs  Lab 08/10/21 0843  INR 1.2   Cardiac Enzymes: No results for input(s): CKTOTAL, CKMB, CKMBINDEX, TROPONINI in the last 168 hours. BNP (last 3 results) No results for input(s): PROBNP in the last 8760 hours. HbA1C: No results for input(s): HGBA1C in the last 72 hours. CBG: No results for input(s): GLUCAP in the last 168 hours. Lipid Profile: No results for input(s): CHOL, HDL, LDLCALC, TRIG, CHOLHDL, LDLDIRECT in the last 72 hours. Thyroid Function Tests: No results for input(s): TSH, T4TOTAL, FREET4, T3FREE, THYROIDAB in the last 72 hours. Anemia Panel: No results for input(s): VITAMINB12, FOLATE, FERRITIN, TIBC, IRON, RETICCTPCT in the last 72 hours. Urine analysis:    Component Value Date/Time   COLORURINE YELLOW (A) 08/10/2021 0958   APPEARANCEUR CLEAR (A) 08/10/2021 0958   LABSPEC 1.036 (H) 08/10/2021 0958   PHURINE 5.0 08/10/2021 0958   GLUCOSEU NEGATIVE 08/10/2021 0958   GLUCOSEU NEGATIVE 07/27/2021 1051   HGBUR NEGATIVE 08/10/2021 0958   BILIRUBINUR NEGATIVE 08/10/2021 0958   BILIRUBINUR negative 07/25/2021 1404   KETONESUR NEGATIVE 08/10/2021 0958   PROTEINUR NEGATIVE 08/10/2021 0958   UROBILINOGEN 0.2  07/27/2021 1051   NITRITE NEGATIVE 08/10/2021 0958   LEUKOCYTESUR NEGATIVE 08/10/2021 0958    Radiological Exams on Admission: CT ANGIO GI BLEED  Result Date: 08/10/2021 CLINICAL DATA:  Generalized abdominal pain and diarrhea. Dark red stool. Concern for GI bleed. Mesenteric ischemia, acute EXAM: CTA ABDOMEN AND PELVIS WITHOUT AND WITH CONTRAST TECHNIQUE: Multidetector CT imaging of the abdomen and pelvis was performed using the standard protocol during bolus administration of intravenous contrast. Multiplanar reconstructed images and MIPs were obtained and reviewed to evaluate the vascular anatomy. CONTRAST:  89mL OMNIPAQUE IOHEXOL 350 MG/ML SOLN COMPARISON:  Lumbar spine x-ray 06/13/2021. FINDINGS: VASCULAR Aorta: Moderate calcified and noncalcified atherosclerotic plaque throughout the abdominal aorta without aneurysm or evidence of significant stenosis. Celiac: Atherosclerosis of the celiac trunk and its branches. Patent without evidence of aneurysm, dissection, vasculitis or significant stenosis. SMA: Mild atherosclerosis. Patent without evidence of aneurysm, dissection, vasculitis or significant stenosis. Renals: Atherosclerosis at the bilateral renal artery ostia. Both renal arteries are patent without evidence of aneurysm, dissection, vasculitis, fibromuscular dysplasia or significant stenosis. IMA: Patent. Inflow: Moderate atherosclerotic plaque. Patent without evidence of aneurysm, dissection, vasculitis or significant stenosis. Proximal Outflow: Moderate atherosclerotic plaque. Bilateral common femoral and visualized portions of the superficial and profunda femoral arteries are patent without evidence of aneurysm, dissection, vasculitis or significant stenosis. Veins: Major venous structures are patent. Review of the MIP images confirms the above findings. NON-VASCULAR Lower chest: Emphysematous changes within the visualized lung bases. Heart size is normal. Relative hypoattenuation of the cardiac  blood pool indicative of anemia. Hepatobiliary: No focal liver abnormality is seen. No gallstones, gallbladder wall thickening, or biliary dilatation. Pancreas: Unremarkable. No pancreatic ductal dilatation or surrounding inflammatory changes. Spleen: Normal in size without focal abnormality. Adrenals/Urinary Tract: Unremarkable adrenal glands. Kidneys are mildly atrophic. Small punctate calcification in the left renal pelvis, likely vascular calcification. No definite renal calculi. No hydronephrosis. Urinary bladder wall is mildly thickened. There is a diverticulum emanating laterally at the left side of the bladder wall. Stomach/Bowel: Stomach is within normal limits. No dilated loops of bowel to suggest obstruction. There is a focally inflamed diverticula within the  mid sigmoid colon with adjacent fat stranding (series 6, image 61. No pericolonic fluid collection or abscess. No extraluminal air. Numerous additional diverticula throughout the colon. Appendix is not visualized, surgically absent by history. Multiphasic images demonstrate no evidence of active GI bleed. No contrast is seen pooling within bowel. Lymphatic: No abdominopelvic lymphadenopathy. Reproductive: Markedly enlarged, heterogeneous prostate gland resulting in significant mass effect upon the base of the urinary bladder. Prostate gland measures approximately 6.0 x 5.1 x 5.8 cm. Other: No free fluid. No abdominopelvic fluid collection. No pneumoperitoneum. Small fat containing inguinal hernias. Musculoskeletal: Severe compression fracture of the T12 vertebral body with approximately 8 mm of retropulsion of the superior endplate likely resulting in at least moderate canal stenosis at this level (series 13, image 91). IMPRESSION: VASCULAR 1. No evidence of active GI bleed or large vessel occlusion. 2. Moderate atherosclerotic disease throughout the aortoiliac axis without aneurysm or dissection. (ICD10-I70.0) NON-VASCULAR 1. Acute uncomplicated  sigmoid diverticulitis. 2. Severe compression fracture of the T12 vertebral body with approximately 8 mm of retropulsion. This likely results in at least moderate canal stenosis at this level. Degree of vertebral body height loss has progressed from 06/13/2021. 3. Markedly enlarged, heterogeneous prostate gland resulting in significant mass effect upon the base of the urinary bladder. 4. Mildly thickened urinary bladder wall is likely secondary to chronic outlet obstruction. Electronically Signed   By: Davina Poke D.O.   On: 08/10/2021 10:34     Assessment/Plan Principal Problem:   Acute blood loss anemia (ABLA) Active Problems:   Compression fracture of thoracic vertebra ([facility])   Hypertension   COPD (chronic obstructive pulmonary disease) ([facility])   Sigmoid diverticulitis     Patient is a 87 year old Caucasian male who presents to the ER for evaluation of abdominal pain and diarrhea.   Acute blood loss anemia Unclear etiology but most likely secondary to diverticular bleed At baseline patient has a hemoglobin of 11.9 g/dl and today on admission his hemoglobin is 8.1 g/dl Patient had an episode of passage of maroon-colored stools in the ER with a drop in his blood pressure and is scheduled to receive 2 units of packed RBC Monitor serial H&H    Acute sigmoid diverticulitis Will place patient empirically on Zosyn Clear liquid diet    Hypertension Hold hydrochlorothiazide for now    History of vertebral compression fracture Following a fall couple months ago Supportive care    DVT prophylaxis: SCD  Code Status: full code  Family Communication: Greater than 50% of time was spent discussing patient's condition and plan of care with him and his daughter at the bedside.  All questions and concerns have been addressed.  They verbalized understanding and agree with the plan. CODE STATUS was discussed and he is a DNR Disposition Plan: Back to previous home environment Consults  called: Gastroenterology Status:At the time of admission, it appears that the appropriate admission status for this patient is inpatient. This is judged to be reasonable and necessary to provide the required intensity of service to ensure the patient's safety given the presenting symptoms, physical exam findings, and initial radiographic and laboratory data in the context of their comorbid conditions. Patient requires inpatient status due to high intensity of service, high risk for further deterioration and high frequency of surveillance required.     Collier Bullock MD [facility]     08/10/2021, 12:40 PM

## 2021-08-10 NOTE — ED Notes (Signed)
Pt completed all  of meal

## 2021-08-10 NOTE — ED Notes (Signed)
Pt taken to CT at this time.

## 2021-08-10 NOTE — ED Provider Notes (Signed)
[facility] Emergency Department Provider Note  ____________________________________________  Time seen: Approximately 10:48 AM  I have reviewed the triage vital signs and the nursing notes.   HISTORY  Chief Complaint Abdominal Pain (C diarrhea)    HPI Jason Powers is a 87 y.o. male with a history of hypertension, COPD, thoracic compression fracture who comes ED complaining of generalized abdominal pain with diarrhea that started yesterday.  States that it looks like normal stool but very watery.  Denies any nausea or vomiting, no fevers or chills or sweats.  Does not take blood thinners.  Symptoms are intermittent, no aggravating or alleviating factors.    Past Medical History:  Diagnosis Date   COPD (chronic obstructive pulmonary disease) ([place])    Glaucoma    Hypertension      Patient Active Problem List   Diagnosis Date Noted   Acute blood loss anemia 08/10/2021   Macrocytic anemia 07/18/2021   Benign prostatic hyperplasia (BPH) with urinary urge incontinence 07/18/2021   Compression fracture of thoracic vertebra (HCC) 06/16/2021   Constipation 06/16/2021   Weight loss 06/16/2021   Bilateral leg weakness 08/30/2020   Knee pain, chronic 07/18/2020   Iron deficiency anemia 03/02/2020   Fluid retention in legs 02/27/2020   Fatigue 02/27/2020     Past Surgical History:  Procedure Laterality Date   APPENDECTOMY       Prior to Admission medications   Medication Sig Start Date End Date Taking? Authorizing Provider  Acetaminophen 325 MG CAPS Take by mouth.    [provider]  brinzolamide (AZOPT) 1 % ophthalmic suspension 1 drop 2 (two) times daily.    [provider]  ferrous fumarate-iron polysaccharide complex (TANDEM) 162-115.2 MG CAPS capsule Take 1 capsule by mouth daily with breakfast. 03/02/20   Crecencio Mc, MD  hydrochlorothiazide (HYDRODIURIL) 25 MG tablet TAKE 1 TABLET BY MOUTH ONCE A DAY 05/23/21   Crecencio Mc, MD  magnesium hydroxide (MILK OF MAGNESIA) 400 MG/5ML suspension Take by mouth daily as needed for mild constipation.    [provider]  tamsulosin (FLOMAX) 0.4 MG CAPS capsule Take 1 capsule (0.4 mg total) by mouth daily. 07/18/21   Leone Haven, MD     Allergies Patient has no known allergies.   Family History  Problem Relation Age of Onset   Drug abuse Brother    Cancer Brother     Social History Social History   Tobacco Use   Smoking status: Former    Types: Cigarettes    Quit date: 1964    Years since quitting: 58.7   Smokeless tobacco: Never  Vaping Use   Vaping Use: Never used  Substance Use Topics   Alcohol use: No   Drug use: No    Review of Systems  Constitutional:   No fever or chills.  ENT:   No sore throat. No rhinorrhea. Cardiovascular:   No chest pain or syncope. Respiratory:   No dyspnea or cough. Gastrointestinal:   Positive as above for abdominal pain and diarrhea Musculoskeletal:   Negative for focal pain or swelling All other systems reviewed and are negative except as documented above in ROS and HPI.  ____________________________________________   PHYSICAL EXAM:  VITAL SIGNS: ED Triage Vitals  Enc Vitals Group     BP 08/10/21 0840 (!) 106/57     Pulse Rate 08/10/21 0840 95     Resp 08/10/21 0840 17     Temp 08/10/21 0840 97.7 F (36.5 C)  Temp Source 08/10/21 0840 Oral     SpO2 08/10/21 0840 96 %     Weight 08/10/21 0836 129 lb 6.6 oz (58.7 kg)     Height 08/10/21 0836 5\' 8"  (1.727 m)     Head Circumference --      Peak Flow --      Pain Score 08/10/21 0836 (S) 4     Pain Loc --      Pain Edu? --      Excl. in [place]? --     Vital signs reviewed, nursing assessments reviewed.   Constitutional:   Alert and oriented. Non-toxic appearance. Eyes:   Conjunctivae are normal. EOMI. PERRL. ENT      Head:   Normocephalic and atraumatic.      Nose:   Normal      Mouth/Throat: Normal, moist mucosa.      Neck:    No meningismus. Full ROM. Hematological/Lymphatic/Immunilogical:   No cervical lymphadenopathy. Cardiovascular:   Irregularly irregular rhythm, heart rate of about 80. Symmetric bilateral radial and DP pulses.  No murmurs. Cap refill less than 2 seconds. Respiratory:   Normal respiratory effort without tachypnea/retractions. Breath sounds are clear and equal bilaterally. No wheezes/rales/rhonchi. Gastrointestinal:   Soft with bilateral lower abdominal tenderness. Non distended. There is no CVA tenderness.  No rebound, rigidity, or guarding. Genitourinary:   deferred Musculoskeletal:   Normal range of motion in all extremities. No joint effusions.  No lower extremity tenderness.  No edema. Neurologic:   Normal speech and language.  Motor grossly intact. No acute focal neurologic deficits are appreciated.  Skin:    Skin is warm, dry and intact. No rash noted.  No petechiae, purpura, or bullae.  ____________________________________________    LABS (pertinent positives/negatives) (all labs ordered are listed, but only abnormal results are displayed) Labs Reviewed  COMPREHENSIVE METABOLIC PANEL - Abnormal; Notable for the following components:      Result Value   Glucose, Bld 162 (*)    BUN 43 (*)    Calcium 8.5 (*)    Total Protein 5.0 (*)    Albumin 3.1 (*)    GFR, Estimated 59 (*)    All other components within normal limits  CBC WITH DIFFERENTIAL/PLATELET - Abnormal; Notable for the following components:   RBC 2.24 (*)    Hemoglobin 8.1 (*)    HCT 23.6 (*)    MCV 105.4 (*)    MCH 36.2 (*)    RDW 15.9 (*)    Neutro Abs 8.5 (*)    Abs Immature Granulocytes 0.09 (*)    All other components within normal limits  C DIFFICILE QUICK SCREEN W PCR REFLEX    LIPASE, BLOOD  PROTIME-INR  APTT  URINALYSIS, COMPLETE (UACMP) WITH MICROSCOPIC  PREPARE RBC (CROSSMATCH)  TYPE AND SCREEN  TYPE AND SCREEN  TYPE AND SCREEN    ____________________________________________   EKG  Interpreted by me Atrial fibrillation, rate of 90.  Left axis, right bundle branch block.  No acute ischemic changes.  ____________________________________________    RADIOLOGY  CT ANGIO GI BLEED  Result Date: 08/10/2021 CLINICAL DATA:  Generalized abdominal pain and diarrhea. Dark red stool. Concern for GI bleed. Mesenteric ischemia, acute EXAM: CTA ABDOMEN AND PELVIS WITHOUT AND WITH CONTRAST TECHNIQUE: Multidetector CT imaging of the abdomen and pelvis was performed using the standard protocol during bolus administration of intravenous contrast. Multiplanar reconstructed images and MIPs were obtained and reviewed to evaluate the vascular anatomy. CONTRAST:  60mL OMNIPAQUE IOHEXOL 350 MG/ML  SOLN COMPARISON:  Lumbar spine x-ray 06/13/2021. FINDINGS: VASCULAR Aorta: Moderate calcified and noncalcified atherosclerotic plaque throughout the abdominal aorta without aneurysm or evidence of significant stenosis. Celiac: Atherosclerosis of the celiac trunk and its branches. Patent without evidence of aneurysm, dissection, vasculitis or significant stenosis. SMA: Mild atherosclerosis. Patent without evidence of aneurysm, dissection, vasculitis or significant stenosis. Renals: Atherosclerosis at the bilateral renal artery ostia. Both renal arteries are patent without evidence of aneurysm, dissection, vasculitis, fibromuscular dysplasia or significant stenosis. IMA: Patent. Inflow: Moderate atherosclerotic plaque. Patent without evidence of aneurysm, dissection, vasculitis or significant stenosis. Proximal Outflow: Moderate atherosclerotic plaque. Bilateral common femoral and visualized portions of the superficial and profunda femoral arteries are patent without evidence of aneurysm, dissection, vasculitis or significant stenosis. Veins: Major venous structures are patent. Review of the MIP images confirms the above findings. NON-VASCULAR Lower chest:  Emphysematous changes within the visualized lung bases. Heart size is normal. Relative hypoattenuation of the cardiac blood pool indicative of anemia. Hepatobiliary: No focal liver abnormality is seen. No gallstones, gallbladder wall thickening, or biliary dilatation. Pancreas: Unremarkable. No pancreatic ductal dilatation or surrounding inflammatory changes. Spleen: Normal in size without focal abnormality. Adrenals/Urinary Tract: Unremarkable adrenal glands. Kidneys are mildly atrophic. Small punctate calcification in the left renal pelvis, likely vascular calcification. No definite renal calculi. No hydronephrosis. Urinary bladder wall is mildly thickened. There is a diverticulum emanating laterally at the left side of the bladder wall. Stomach/Bowel: Stomach is within normal limits. No dilated loops of bowel to suggest obstruction. There is a focally inflamed diverticula within the mid sigmoid colon with adjacent fat stranding (series 6, image 61. No pericolonic fluid collection or abscess. No extraluminal air. Numerous additional diverticula throughout the colon. Appendix is not visualized, surgically absent by history. Multiphasic images demonstrate no evidence of active GI bleed. No contrast is seen pooling within bowel. Lymphatic: No abdominopelvic lymphadenopathy. Reproductive: Markedly enlarged, heterogeneous prostate gland resulting in significant mass effect upon the base of the urinary bladder. Prostate gland measures approximately 6.0 x 5.1 x 5.8 cm. Other: No free fluid. No abdominopelvic fluid collection. No pneumoperitoneum. Small fat containing inguinal hernias. Musculoskeletal: Severe compression fracture of the T12 vertebral body with approximately 8 mm of retropulsion of the superior endplate likely resulting in at least moderate canal stenosis at this level (series 13, image 91). IMPRESSION: VASCULAR 1. No evidence of active GI bleed or large vessel occlusion. 2. Moderate atherosclerotic  disease throughout the aortoiliac axis without aneurysm or dissection. (ICD10-I70.0) NON-VASCULAR 1. Acute uncomplicated sigmoid diverticulitis. 2. Severe compression fracture of the T12 vertebral body with approximately 8 mm of retropulsion. This likely results in at least moderate canal stenosis at this level. Degree of vertebral body height loss has progressed from 06/13/2021. 3. Markedly enlarged, heterogeneous prostate gland resulting in significant mass effect upon the base of the urinary bladder. 4. Mildly thickened urinary bladder wall is likely secondary to chronic outlet obstruction. Electronically Signed   By: Davina Poke D.O.   On: 08/10/2021 10:34    ____________________________________________   PROCEDURES .Critical Care Performed by: Carrie Mew, MD Authorized by: Carrie Mew, MD   Critical care provider statement:    Critical care time (minutes):  35   Critical care time was exclusive of:  Separately billable procedures and treating other patients   Critical care was necessary to treat or prevent imminent or life-threatening deterioration of the following conditions:  Circulatory failure and shock   Critical care was time spent personally by me on the following  activities:  Development of treatment plan with patient or surrogate, discussions with consultants, evaluation of patient's response to treatment, examination of patient, obtaining history from patient or surrogate, ordering and performing treatments and interventions, ordering and review of laboratory studies, ordering and review of radiographic studies, pulse oximetry, re-evaluation of patient's condition and review of old charts  ____________________________________________  DIFFERENTIAL DIAGNOSIS   Diverticulitis, mesenteric ischemia, GI bleed, c dif colitis, viral illness  CLINICAL IMPRESSION / ASSESSMENT AND PLAN / ED COURSE  Medications ordered in the ED: Medications  lactated ringers bolus  1,000 mL (1,000 mLs Intravenous New Bag/Given 08/10/21 0918)  0.9 %  sodium chloride infusion (10 mL/hr Intravenous New Bag/Given 08/10/21 1035)  iohexol (OMNIPAQUE) 350 MG/ML injection 80 mL (80 mLs Intravenous Contrast Given 08/10/21 0930)    Pertinent labs & imaging results that were available during my care of the patient were reviewed by me and considered in my medical decision making (see chart for details).  Jason Powers was evaluated in Emergency Department on 08/10/2021 for the symptoms described in the history of present illness. He was evaluated in the context of the global COVID-19 pandemic, which necessitated consideration that the patient might be at risk for infection with the SARS-CoV-2 virus that causes COVID-19. Institutional protocols and algorithms that pertain to the evaluation of patients at risk for COVID-19 are in a state of rapid change based on information released by regulatory bodies including the CDC and federal and state organizations. These policies and algorithms were followed during the patient's care in the ED.   Patient presents with abdominal pain and diarrhea.  He is found to be in new onset of atrial fibrillation.  He is not on blood thinners or AV nodal blockers.  Currently rate controlled.  After initial assessment, patient felt like he needed to have a bowel movement and passed about 200 mL of maroon, nonmelanotic bloody output.  Hemoglobin is decreased from a baseline of 11-8.  CTA a/p does not localize a bleeding source.  We will start transfusion of 2 units due to his ongoing lower GI bleed.      ____________________________________________   FINAL CLINICAL IMPRESSION(S) / ED DIAGNOSES    Final diagnoses:  Acute lower GI bleeding  Symptomatic anemia  New onset atrial fibrillation [facility])     ED Discharge Orders     None       Portions of this note were generated with dragon dictation software. Dictation errors may occur despite best  attempts at proofreading.    Carrie Mew, MD 08/10/21 [phone]

## 2021-08-10 NOTE — ED Triage Notes (Signed)
Pt presents via [facility] from home. Reports abd pain generalized with diarrhea since yesterday. Aox4. [facility] found pt to be in a-fib with no known hx. 85% on RA, on 4L [place] 100%

## 2021-08-10 NOTE — ED Notes (Signed)
Blood form in chart

## 2021-08-10 NOTE — ED Notes (Signed)
Blood administration form placed with pt's chart.

## 2021-08-10 NOTE — ED Notes (Signed)
Md Agbata informed at this time of Lasix hold d/t BP of 97/61

## 2021-08-11 DIAGNOSIS — K922 Gastrointestinal hemorrhage, unspecified: Secondary | ICD-10-CM

## 2021-08-11 DIAGNOSIS — K625 Hemorrhage of anus and rectum: Secondary | ICD-10-CM | POA: Diagnosis not present

## 2021-08-11 DIAGNOSIS — K5732 Diverticulitis of large intestine without perforation or abscess without bleeding: Secondary | ICD-10-CM | POA: Diagnosis not present

## 2021-08-11 DIAGNOSIS — D62 Acute posthemorrhagic anemia: Secondary | ICD-10-CM | POA: Diagnosis not present

## 2021-08-11 LAB — BASIC METABOLIC PANEL
Anion gap: 8 (ref 5–15)
BUN: 34 mg/dL — ABNORMAL HIGH (ref 8–23)
CO2: 30 mmol/L (ref 22–32)
Calcium: 8.2 mg/dL — ABNORMAL LOW (ref 8.9–10.3)
Chloride: 103 mmol/L (ref 98–111)
Creatinine, Ser: 0.86 mg/dL (ref 0.61–1.24)
GFR, Estimated: >60 mL/min (ref >60)
Glucose, Bld: 108 mg/dL — ABNORMAL HIGH (ref 70–99)
Potassium: 3.6 mmol/L (ref 3.5–5.1)
Sodium: 141 mmol/L (ref 135–145)

## 2021-08-11 LAB — HEMOGLOBIN AND HEMATOCRIT, BLOOD
HCT: 27.7 % — ABNORMAL LOW (ref 39.0–52.0)
Hemoglobin: 9.3 g/dL — ABNORMAL LOW (ref 13.0–17.0)

## 2021-08-11 MED ORDER — HYDROCHLOROTHIAZIDE 25 MG PO TABS: ORAL_TABLET | ORAL | 1 refills | Status: DC

## 2021-08-11 MED ORDER — METRONIDAZOLE 500 MG PO TABS: 500.0000 mg | ORAL_TABLET | Freq: Two times a day (BID) | ORAL | 0 refills | Status: DC

## 2021-08-11 MED ORDER — METRONIDAZOLE 500 MG PO TABS: 500.0000 mg | ORAL_TABLET | Freq: Two times a day (BID) | ORAL | 0 refills | Status: AC

## 2021-08-11 NOTE — Progress Notes (Signed)
Jason Darby, MD [address]  [place]  [place], [place] [address]  Main: [phone]  Fax: [phone] Pager: [phone]   Subjective: No acute events overnight, patient reports mild left lower quadrant discomfort.  He is very hungry and eating his lunch.  His daughter is bedside.  No further episodes of rectal bleeding   Objective: Vital signs in last 24 hours: Vitals:   08/11/21 0638 08/11/21 0700 08/11/21 1100 08/11/21 1200  BP: (!) 108/57 (!) 104/50 114/62 (!) 120/55  Pulse: 66 69 68 64  Resp: (!) 24 19 18 18   Temp:      TempSrc:      SpO2: 96% 96% 99% 98%  Weight:      Height:       Weight change:   Intake/Output Summary (Last 24 hours) at 08/11/2021 1416 Last data filed at 08/11/2021 0024 Gross per 24 hour  Intake 2060 ml  Output --  Net 2060 ml     Exam: Heart:: Regular rate and rhythm, S1S2 present, or without murmur or extra heart sounds Lungs: normal and clear to auscultation Abdomen: soft, nontender, normal bowel sounds   Lab Results: CBC Latest Ref Rng & Units 08/11/2021 08/10/2021 08/10/2021  WBC 4.0 - 10.5 K/uL - - 10.1  Hemoglobin 13.0 - 17.0 g/dL 9.3(L) 9.5(L) 8.1(L)  Hematocrit 39.0 - 52.0 % 27.7(L) 28.1(L) 23.6(L)  Platelets 150 - 400 K/uL - - 240   CMP Latest Ref Rng & Units 08/11/2021 08/10/2021 06/16/2021  Glucose 70 - 99 mg/dL 108(H) 162(H) 112(H)  BUN 8 - 23 mg/dL 34(H) 43(H) 29(H)  Creatinine 0.61 - 1.24 mg/dL 0.86 1.12 1.26  Sodium 135 - 145 mmol/L 141 140 134(L)  Potassium 3.5 - 5.1 mmol/L 3.6 3.8 3.7  Chloride 98 - 111 mmol/L 103 100 94(L)  CO2 22 - 32 mmol/L 30 30 31   Calcium 8.9 - 10.3 mg/dL 8.2(L) 8.5(L) 9.0  Total Protein 6.5 - 8.1 g/dL - 5.0(L) 6.7  Total Bilirubin 0.3 - 1.2 mg/dL - 1.2 0.7  Alkaline Phos 38 - 126 U/L - 38 65  AST 15 - 41 U/L - 21 15  ALT 0 - 44 U/L - 15 12     Current Facility-Administered Medications:    acetaminophen (TYLENOL) tablet 650 mg, 650 mg, Oral, Q6H PRN **OR** acetaminophen  (TYLENOL) suppository 650 mg, 650 mg, Rectal, Q6H PRN, Agbata, Tochukwu, MD   brinzolamide (AZOPT) 1 % ophthalmic suspension 1 drop, 1 drop, Both Eyes, BID, Agbata, Tochukwu, MD, 1 drop at 08/11/21 0938   furosemide (LASIX) injection 20 mg, 20 mg, Intravenous, Once, Agbata, Tochukwu, MD   ondansetron (ZOFRAN) tablet 4 mg, 4 mg, Oral, Q6H PRN **OR** ondansetron (ZOFRAN) injection 4 mg, 4 mg, Intravenous, Q6H PRN, Agbata, Tochukwu, MD   pantoprazole (PROTONIX) EC tablet 40 mg, 40 mg, Oral, BID AC, Mariella Blackwelder, Tally Due, MD, 40 mg at 08/11/21 0824   piperacillin-tazobactam (ZOSYN) IVPB 3.375 g, 3.375 g, Intravenous, Q8H, Agbata, Tochukwu, MD, Stopped at 08/11/21 1033   tamsulosin (FLOMAX) capsule 0.4 mg, 0.4 mg, Oral, Daily, Agbata, Tochukwu, MD, 0.4 mg at 08/11/21 1211  Current Outpatient Medications:    Acetaminophen 325 MG CAPS, Take by mouth., Disp: , Rfl:    brinzolamide (AZOPT) 1 % ophthalmic suspension, 1 drop 2 (two) times daily., Disp: , Rfl:    ferrous fumarate-iron polysaccharide complex (TANDEM) 162-115.2 MG CAPS capsule, Take 1 capsule by mouth daily with breakfast., Disp: 30 capsule, Rfl: 2   hydrochlorothiazide (HYDRODIURIL) 25  MG tablet, TAKE 1 TABLET BY MOUTH ONCE A DAY, Disp: 90 tablet, Rfl: 1   magnesium hydroxide (MILK OF MAGNESIA) 400 MG/5ML suspension, Take by mouth daily as needed for mild constipation., Disp: , Rfl:    tamsulosin (FLOMAX) 0.4 MG CAPS capsule, Take 1 capsule (0.4 mg total) by mouth daily., Disp: 30 capsule, Rfl: 3  Micro Results: Recent Results (from the past 240 hour(s))  C Difficile Quick Screen w PCR reflex     Status: None   Collection Time: 08/10/21  8:42 AM   Specimen: STOOL  Result Value Ref Range Status   C Diff antigen NEGATIVE NEGATIVE Final   C Diff toxin NEGATIVE NEGATIVE Final   C Diff interpretation No C. difficile detected.  Final    Comment: Performed at [facility], [address] [place], [place] [address]  Resp Panel by  RT-PCR (Flu A&B, Covid) Nasopharyngeal Swab     Status: None   Collection Time: 08/10/21  8:42 AM   Specimen: Nasopharyngeal Swab; Nasopharyngeal(NP) swabs in vial transport medium  Result Value Ref Range Status   SARS Coronavirus 2 by RT PCR NEGATIVE NEGATIVE Final    Comment: (NOTE) SARS-CoV-2 target nucleic acids are NOT DETECTED.  The SARS-CoV-2 RNA is generally detectable in upper respiratory specimens during the acute phase of infection. The lowest concentration of SARS-CoV-2 viral copies this assay can detect is 138 copies/mL. A negative result does not preclude SARS-Cov-2 infection and should not be used as the sole basis for treatment or other patient management decisions. A negative result may occur with  improper specimen collection/handling, submission of specimen other than nasopharyngeal swab, presence of viral mutation(s) within the areas targeted by this assay, and inadequate number of viral copies(<138 copies/mL). A negative result must be combined with clinical observations, patient history, and epidemiological information. The expected result is Negative.  Fact Sheet for Patients:  EntrepreneurPulse.com.au  Fact Sheet for Healthcare Providers:  IncredibleEmployment.be  This test is no t yet approved or cleared by the [place] FDA and  has been authorized for detection and/or diagnosis of SARS-CoV-2 by FDA under an Emergency Use Authorization (EUA). This EUA will remain  in effect (meaning this test can be used) for the duration of the COVID-19 declaration under Section 564(b)(1) of the Act, 21 U.S.C.section 360bbb-3(b)(1), unless the authorization is terminated  or revoked sooner.       Influenza A by PCR NEGATIVE NEGATIVE Final   Influenza B by PCR NEGATIVE NEGATIVE Final    Comment: (NOTE) The Xpert Xpress SARS-CoV-2/FLU/RSV plus assay is intended as an aid in the diagnosis of influenza from Nasopharyngeal swab  specimens and should not be used as a sole basis for treatment. Nasal washings and aspirates are unacceptable for Xpert Xpress SARS-CoV-2/FLU/RSV testing.  Fact Sheet for Patients: EntrepreneurPulse.com.au  Fact Sheet for Healthcare Providers: IncredibleEmployment.be  This test is not yet approved or cleared by the [place] FDA and has been authorized for detection and/or diagnosis of SARS-CoV-2 by FDA under an Emergency Use Authorization (EUA). This EUA will remain in effect (meaning this test can be used) for the duration of the COVID-19 declaration under Section 564(b)(1) of the Act, 21 U.S.C. section 360bbb-3(b)(1), unless the authorization is terminated or revoked.  Performed at [facility], [address] [place], [place] [address]    Studies/Results: CT ANGIO GI BLEED  Result Date: 08/10/2021 CLINICAL DATA:  Generalized abdominal pain and diarrhea. Dark red stool. Concern for GI bleed. Mesenteric ischemia, acute  EXAM: CTA ABDOMEN AND PELVIS WITHOUT AND WITH CONTRAST TECHNIQUE: Multidetector CT imaging of the abdomen and pelvis was performed using the standard protocol during bolus administration of intravenous contrast. Multiplanar reconstructed images and MIPs were obtained and reviewed to evaluate the vascular anatomy. CONTRAST:  83mL OMNIPAQUE IOHEXOL 350 MG/ML SOLN COMPARISON:  Lumbar spine x-ray 06/13/2021. FINDINGS: VASCULAR Aorta: Moderate calcified and noncalcified atherosclerotic plaque throughout the abdominal aorta without aneurysm or evidence of significant stenosis. Celiac: Atherosclerosis of the celiac trunk and its branches. Patent without evidence of aneurysm, dissection, vasculitis or significant stenosis. SMA: Mild atherosclerosis. Patent without evidence of aneurysm, dissection, vasculitis or significant stenosis. Renals: Atherosclerosis at the bilateral renal artery ostia. Both renal arteries are patent without  evidence of aneurysm, dissection, vasculitis, fibromuscular dysplasia or significant stenosis. IMA: Patent. Inflow: Moderate atherosclerotic plaque. Patent without evidence of aneurysm, dissection, vasculitis or significant stenosis. Proximal Outflow: Moderate atherosclerotic plaque. Bilateral common femoral and visualized portions of the superficial and profunda femoral arteries are patent without evidence of aneurysm, dissection, vasculitis or significant stenosis. Veins: Major venous structures are patent. Review of the MIP images confirms the above findings. NON-VASCULAR Lower chest: Emphysematous changes within the visualized lung bases. Heart size is normal. Relative hypoattenuation of the cardiac blood pool indicative of anemia. Hepatobiliary: No focal liver abnormality is seen. No gallstones, gallbladder wall thickening, or biliary dilatation. Pancreas: Unremarkable. No pancreatic ductal dilatation or surrounding inflammatory changes. Spleen: Normal in size without focal abnormality. Adrenals/Urinary Tract: Unremarkable adrenal glands. Kidneys are mildly atrophic. Small punctate calcification in the left renal pelvis, likely vascular calcification. No definite renal calculi. No hydronephrosis. Urinary bladder wall is mildly thickened. There is a diverticulum emanating laterally at the left side of the bladder wall. Stomach/Bowel: Stomach is within normal limits. No dilated loops of bowel to suggest obstruction. There is a focally inflamed diverticula within the mid sigmoid colon with adjacent fat stranding (series 6, image 61. No pericolonic fluid collection or abscess. No extraluminal air. Numerous additional diverticula throughout the colon. Appendix is not visualized, surgically absent by history. Multiphasic images demonstrate no evidence of active GI bleed. No contrast is seen pooling within bowel. Lymphatic: No abdominopelvic lymphadenopathy. Reproductive: Markedly enlarged, heterogeneous prostate  gland resulting in significant mass effect upon the base of the urinary bladder. Prostate gland measures approximately 6.0 x 5.1 x 5.8 cm. Other: No free fluid. No abdominopelvic fluid collection. No pneumoperitoneum. Small fat containing inguinal hernias. Musculoskeletal: Severe compression fracture of the T12 vertebral body with approximately 8 mm of retropulsion of the superior endplate likely resulting in at least moderate canal stenosis at this level (series 13, image 91). IMPRESSION: VASCULAR 1. No evidence of active GI bleed or large vessel occlusion. 2. Moderate atherosclerotic disease throughout the aortoiliac axis without aneurysm or dissection. (ICD10-I70.0) NON-VASCULAR 1. Acute uncomplicated sigmoid diverticulitis. 2. Severe compression fracture of the T12 vertebral body with approximately 8 mm of retropulsion. This likely results in at least moderate canal stenosis at this level. Degree of vertebral body height loss has progressed from 06/13/2021. 3. Markedly enlarged, heterogeneous prostate gland resulting in significant mass effect upon the base of the urinary bladder. 4. Mildly thickened urinary bladder wall is likely secondary to chronic outlet obstruction. Electronically Signed   By: Davina Poke D.O.   On: 08/10/2021 10:34   Medications: Straining Scheduled Meds:  brinzolamide  1 drop Both Eyes BID   furosemide  20 mg Intravenous Once   pantoprazole  40 mg Oral BID AC   tamsulosin  0.4 mg Oral Daily   Continuous Infusions:  piperacillin-tazobactam (ZOSYN)  IV Stopped (08/11/21 1033)   PRN Meds:.acetaminophen **OR** acetaminophen, ondansetron **OR** ondansetron (ZOFRAN) IV   Assessment: Principal Problem:   Acute blood loss anemia (ABLA) Active Problems:   Compression fracture of thoracic vertebra (HCC)   Hypertension   COPD (chronic obstructive pulmonary disease) (HCC)   Sigmoid diverticulitis   Plan: Acute sigmoid diverticulitis, uncomplicated Switch from Zosyn to  Flagyl 500 mg p.o. 2 times daily for 5 days Tolerating diet well  Rectal bleeding/hematochezia Currently resolved, likely secondary to diverticulitis  Okay to discharge home from GI standpoint.  GI will sign off, please call us back with questions or concerns   LOS: 1 day   Natalynn Pedone 08/11/2021, 2:16 PM

## 2021-08-11 NOTE — ED Notes (Signed)
Pt sleeping NADN 

## 2021-08-11 NOTE — ED Notes (Signed)
Pt called out and states "I think I wet the bed". [place]rose placed on pt. Full bed linen change and new brief placed on pt. Pt denies any other needs at this time. Fresh water given to pt as well. Call bell in reach.

## 2021-08-11 NOTE — ED Notes (Signed)
Informed RN bed assigned again 

## 2021-08-11 NOTE — Care Management CC44 (Signed)
Condition Code 44 Documentation Completed  Patient Details  Name: Mackay Hanauer MRN: [id number] Date of Birth: 06-04-1921   Condition Code 44 given:  Yes Patient signature on Condition Code 44 notice:  Yes Documentation of 2 MD's agreement:  Yes Code 44 added to claim:  Yes    Adelene Amas, [place] 08/11/2021, 3:45 PM

## 2021-08-11 NOTE — ED Notes (Signed)
Pt ambulated in room with steady gait using front wheel walker. Pt denies any complaints while up walking. Pt placed back in bed at this time.

## 2021-08-11 NOTE — Discharge Summary (Signed)
Physician Discharge Summary   Jason Powers  male DOB: Jul 22, 1921  LTJ:[id number]  PCP: Crecencio Mc, MD  Admit date: 08/10/2021 Discharge date: 08/11/2021  Admitted From: home Disposition:  home CODE STATUS: DNR   Hospital Course:  For full details, please see H&P, progress notes, consult notes and ancillary notes.  Briefly,  Jason Powers is a 87 y.o. male with medical history significant for hypertension, COPD, glaucoma who presents to the ER via EMS for evaluation of abdominal pain and diarrhea which he has had for about 24 hours.  When EMS arrived patient was found to be in A. fib and had room air pulse oximetry of 85% and was placed on 4 L of oxygen.  However, since presentation, pt was documented to be on room air sating well.  Acute on chronic anemia likely due to diverticular bleed --At baseline patient has a hemoglobin of 11.9 g/dl and on admission his hemoglobin was 8.1 g/dl.   Patient had an episode of passage of maroon-colored stools in the ER with a drop in his blood pressure and therefore received 2 units of packed RBC in the ED. --GI was consulted and clear pt for discharge.  Hgb stable ~9's prior to discharge.    Acute sigmoid diverticulitis, uncomplicated --started patient empirically on Zosyn on presentation.  Pt tolerated diet well.  Switched to oral Flagyl 500 mg BID for 5 days at discharge, per GI rec. --Addendum:  ED RN could not locate the printed Rx for Flagyl, so Rx resent to [facility] per daughter's request.   Hypertension --hydrochlorothiazide held pending outpatient followup since BP normal without it.   History of vertebral compression fracture Following a fall couple months ago.  Pt has been receiving outpatient physical therapy.  Stool incontinence --pt's main complaint was stool incontinence that has been going on for about 2 weeks, that stool just "goes all over the floor."  No diarrhea observed since presentation.  Due to pt's concern  for going home with this issue, I offered PT eval to see if pt could qualify for SNF, however, daughter said pt was at his baseline mobility and declined.    Discharge Diagnoses:  Principal Problem:   Acute blood loss anemia (ABLA) Active Problems:   Compression fracture of thoracic vertebra (HCC)   Hypertension   COPD (chronic obstructive pulmonary disease) (HCC)   Sigmoid diverticulitis   30 Day Unplanned Readmission Risk Score    Flowsheet Row ED from 08/10/2021 in [place]  30 Day Unplanned Readmission Risk Score (%) 13.61 Filed at 08/11/2021 1200       This score is the patient's risk of an unplanned readmission within 30 days of being discharged (0 -100%). The score is based on dignosis, age, lab data, medications, orders, and past utilization.   Low:  0-14.9   Medium: 15-21.9   High: 22-29.9   Extreme: 30 and above         Discharge Instructions:  Allergies as of 08/11/2021   No Known Allergies      Medication List     TAKE these medications    Acetaminophen 325 MG Caps Take by mouth.   brinzolamide 1 % ophthalmic suspension Commonly known as: AZOPT 1 drop 2 (two) times daily.   ferrous fumarate-iron polysaccharide complex 162-115.2 MG Caps capsule Commonly known as: TANDEM Take 1 capsule by mouth daily with breakfast.   hydrochlorothiazide 25 MG tablet Commonly known as: HYDRODIURIL Hold until followup with outpatient provider since  your blood pressure was normal without this. What changed:  how much to take how to take this when to take this additional instructions   magnesium hydroxide 400 MG/5ML suspension Commonly known as: MILK OF MAGNESIA Take by mouth daily as needed for mild constipation.   metroNIDAZOLE 500 MG tablet Commonly known as: Flagyl Take 1 tablet (500 mg total) by mouth 2 (two) times daily for 5 days.   tamsulosin 0.4 MG Caps capsule Commonly known as: FLOMAX Take 1 capsule (0.4  mg total) by mouth daily.         Follow-up Information     Crecencio Mc, MD Follow up in 1 week(s).   Specialty: Internal Medicine Contact information: [address] [place] [address] [phone]                 No Known Allergies   The results of significant diagnostics from this hospitalization (including imaging, microbiology, ancillary and laboratory) are listed below for reference.   Consultations:   Procedures/Studies: CT ANGIO GI BLEED  Result Date: 08/10/2021 CLINICAL DATA:  Generalized abdominal pain and diarrhea. Dark red stool. Concern for GI bleed. Mesenteric ischemia, acute EXAM: CTA ABDOMEN AND PELVIS WITHOUT AND WITH CONTRAST TECHNIQUE: Multidetector CT imaging of the abdomen and pelvis was performed using the standard protocol during bolus administration of intravenous contrast. Multiplanar reconstructed images and MIPs were obtained and reviewed to evaluate the vascular anatomy. CONTRAST:  38mL OMNIPAQUE IOHEXOL 350 MG/ML SOLN COMPARISON:  Lumbar spine x-ray 06/13/2021. FINDINGS: VASCULAR Aorta: Moderate calcified and noncalcified atherosclerotic plaque throughout the abdominal aorta without aneurysm or evidence of significant stenosis. Celiac: Atherosclerosis of the celiac trunk and its branches. Patent without evidence of aneurysm, dissection, vasculitis or significant stenosis. SMA: Mild atherosclerosis. Patent without evidence of aneurysm, dissection, vasculitis or significant stenosis. Renals: Atherosclerosis at the bilateral renal artery ostia. Both renal arteries are patent without evidence of aneurysm, dissection, vasculitis, fibromuscular dysplasia or significant stenosis. IMA: Patent. Inflow: Moderate atherosclerotic plaque. Patent without evidence of aneurysm, dissection, vasculitis or significant stenosis. Proximal Outflow: Moderate atherosclerotic plaque. Bilateral common femoral and visualized portions of the superficial and  profunda femoral arteries are patent without evidence of aneurysm, dissection, vasculitis or significant stenosis. Veins: Major venous structures are patent. Review of the MIP images confirms the above findings. NON-VASCULAR Lower chest: Emphysematous changes within the visualized lung bases. Heart size is normal. Relative hypoattenuation of the cardiac blood pool indicative of anemia. Hepatobiliary: No focal liver abnormality is seen. No gallstones, gallbladder wall thickening, or biliary dilatation. Pancreas: Unremarkable. No pancreatic ductal dilatation or surrounding inflammatory changes. Spleen: Normal in size without focal abnormality. Adrenals/Urinary Tract: Unremarkable adrenal glands. Kidneys are mildly atrophic. Small punctate calcification in the left renal pelvis, likely vascular calcification. No definite renal calculi. No hydronephrosis. Urinary bladder wall is mildly thickened. There is a diverticulum emanating laterally at the left side of the bladder wall. Stomach/Bowel: Stomach is within normal limits. No dilated loops of bowel to suggest obstruction. There is a focally inflamed diverticula within the mid sigmoid colon with adjacent fat stranding (series 6, image 61. No pericolonic fluid collection or abscess. No extraluminal air. Numerous additional diverticula throughout the colon. Appendix is not visualized, surgically absent by history. Multiphasic images demonstrate no evidence of active GI bleed. No contrast is seen pooling within bowel. Lymphatic: No abdominopelvic lymphadenopathy. Reproductive: Markedly enlarged, heterogeneous prostate gland resulting in significant mass effect upon the base of the urinary bladder. Prostate gland measures approximately 6.0 x  5.1 x 5.8 cm. Other: No free fluid. No abdominopelvic fluid collection. No pneumoperitoneum. Small fat containing inguinal hernias. Musculoskeletal: Severe compression fracture of the T12 vertebral body with approximately 8 mm of  retropulsion of the superior endplate likely resulting in at least moderate canal stenosis at this level (series 13, image 91). IMPRESSION: VASCULAR 1. No evidence of active GI bleed or large vessel occlusion. 2. Moderate atherosclerotic disease throughout the aortoiliac axis without aneurysm or dissection. (ICD10-I70.0) NON-VASCULAR 1. Acute uncomplicated sigmoid diverticulitis. 2. Severe compression fracture of the T12 vertebral body with approximately 8 mm of retropulsion. This likely results in at least moderate canal stenosis at this level. Degree of vertebral body height loss has progressed from 06/13/2021. 3. Markedly enlarged, heterogeneous prostate gland resulting in significant mass effect upon the base of the urinary bladder. 4. Mildly thickened urinary bladder wall is likely secondary to chronic outlet obstruction. Electronically Signed   By: Davina Poke D.O.   On: 08/10/2021 10:34      Labs: BNP (last 3 results) No results for input(s): BNP in the last 8760 hours. Basic Metabolic Panel: Recent Labs  Lab 08/10/21 0842 08/11/21 0702  NA 140 141  K 3.8 3.6  CL 100 103  CO2 30 30  GLUCOSE 162* 108*  BUN 43* 34*  CREATININE 1.12 0.86  CALCIUM 8.5* 8.2*   Liver Function Tests: Recent Labs  Lab 08/10/21 0842  AST 21  ALT 15  ALKPHOS 38  BILITOT 1.2  PROT 5.0*  ALBUMIN 3.1*   Recent Labs  Lab 08/10/21 0842  LIPASE 48   No results for input(s): AMMONIA in the last 168 hours. CBC: Recent Labs  Lab 08/10/21 0842 08/10/21 2106 08/11/21 0702  WBC 10.1  --   --   NEUTROABS 8.5*  --   --   HGB 8.1* 9.5* 9.3*  HCT 23.6* 28.1* 27.7*  MCV 105.4*  --   --   PLT 240  --   --    Cardiac Enzymes: No results for input(s): CKTOTAL, CKMB, CKMBINDEX, TROPONINI in the last 168 hours. BNP: Invalid input(s): POCBNP CBG: No results for input(s): GLUCAP in the last 168 hours. D-Dimer No results for input(s): DDIMER in the last 72 hours. Hgb A1c No results for input(s):  HGBA1C in the last 72 hours. Lipid Profile No results for input(s): CHOL, HDL, LDLCALC, TRIG, CHOLHDL, LDLDIRECT in the last 72 hours. Thyroid function studies No results for input(s): TSH, T4TOTAL, T3FREE, THYROIDAB in the last 72 hours.  Invalid input(s): FREET3 Anemia work up Recent Labs    08/10/21 2106  FERRITIN 195   Urinalysis    Component Value Date/Time   COLORURINE YELLOW (A) 08/10/2021 0958   APPEARANCEUR CLEAR (A) 08/10/2021 0958   LABSPEC 1.036 (H) 08/10/2021 0958   PHURINE 5.0 08/10/2021 0958   GLUCOSEU NEGATIVE 08/10/2021 0958   GLUCOSEU NEGATIVE 07/27/2021 1051   HGBUR NEGATIVE 08/10/2021 0958   BILIRUBINUR NEGATIVE 08/10/2021 0958   BILIRUBINUR negative 07/25/2021 1404   KETONESUR NEGATIVE 08/10/2021 0958   PROTEINUR NEGATIVE 08/10/2021 0958   UROBILINOGEN 0.2 07/27/2021 1051   NITRITE NEGATIVE 08/10/2021 0958   LEUKOCYTESUR NEGATIVE 08/10/2021 0958   Sepsis Labs Invalid input(s): PROCALCITONIN,  WBC,  LACTICIDVEN Microbiology Recent Results (from the past 240 hour(s))  C Difficile Quick Screen w PCR reflex     Status: None   Collection Time: 08/10/21  8:42 AM   Specimen: STOOL  Result Value Ref Range Status   C Diff antigen NEGATIVE NEGATIVE  Final   C Diff toxin NEGATIVE NEGATIVE Final   C Diff interpretation No C. difficile detected.  Final    Comment: Performed at [facility], [address] [place], [place] [address]  Resp Panel by RT-PCR (Flu A&B, Covid) Nasopharyngeal Swab     Status: None   Collection Time: 08/10/21  8:42 AM   Specimen: Nasopharyngeal Swab; Nasopharyngeal(NP) swabs in vial transport medium  Result Value Ref Range Status   SARS Coronavirus 2 by RT PCR NEGATIVE NEGATIVE Final    Comment: (NOTE) SARS-CoV-2 target nucleic acids are NOT DETECTED.  The SARS-CoV-2 RNA is generally detectable in upper respiratory specimens during the acute phase of infection. The lowest concentration of SARS-CoV-2 viral copies this  assay can detect is 138 copies/mL. A negative result does not preclude SARS-Cov-2 infection and should not be used as the sole basis for treatment or other patient management decisions. A negative result may occur with  improper specimen collection/handling, submission of specimen other than nasopharyngeal swab, presence of viral mutation(s) within the areas targeted by this assay, and inadequate number of viral copies(<138 copies/mL). A negative result must be combined with clinical observations, patient history, and epidemiological information. The expected result is Negative.  Fact Sheet for Patients:  EntrepreneurPulse.com.au  Fact Sheet for Healthcare Providers:  IncredibleEmployment.be  This test is no t yet approved or cleared by the [place] FDA and  has been authorized for detection and/or diagnosis of SARS-CoV-2 by FDA under an Emergency Use Authorization (EUA). This EUA will remain  in effect (meaning this test can be used) for the duration of the COVID-19 declaration under Section 564(b)(1) of the Act, 21 U.S.C.section 360bbb-3(b)(1), unless the authorization is terminated  or revoked sooner.       Influenza A by PCR NEGATIVE NEGATIVE Final   Influenza B by PCR NEGATIVE NEGATIVE Final    Comment: (NOTE) The Xpert Xpress SARS-CoV-2/FLU/RSV plus assay is intended as an aid in the diagnosis of influenza from Nasopharyngeal swab specimens and should not be used as a sole basis for treatment. Nasal washings and aspirates are unacceptable for Xpert Xpress SARS-CoV-2/FLU/RSV testing.  Fact Sheet for Patients: EntrepreneurPulse.com.au  Fact Sheet for Healthcare Providers: IncredibleEmployment.be  This test is not yet approved or cleared by the [place] FDA and has been authorized for detection and/or diagnosis of SARS-CoV-2 by FDA under an Emergency Use Authorization (EUA). This EUA will  remain in effect (meaning this test can be used) for the duration of the COVID-19 declaration under Section 564(b)(1) of the Act, 21 U.S.C. section 360bbb-3(b)(1), unless the authorization is terminated or revoked.  Performed at [facility], [address] [place], [place] [address]      Total time spend on discharging this patient, including the last patient exam, discussing the hospital stay, instructions for ongoing care as it relates to all pertinent caregivers, as well as preparing the medical discharge records, prescriptions, and/or referrals as applicable, is 60 minutes.    Enzo Bi, MD  [facility] 08/11/2021, 2:22 PM

## 2021-08-11 NOTE — ED Notes (Signed)
Pt resting comfortably at this time. Normal rise and fall of chest. NAD noted. Call bell in reach

## 2021-08-11 NOTE — ED Notes (Signed)
Informed RN bed assigned 

## 2021-08-11 NOTE — ED Notes (Signed)
Pt changed

## 2021-08-11 NOTE — ED Notes (Signed)
Secure msg sent to Dr. Billie Ruddy asking for Rx to be tubed or brought down to the ER, as it has not printed on any ER printer.

## 2021-08-11 NOTE — Care Management Obs Status (Signed)
[facility] NOTIFICATION   Patient Details  Name: Jason Powers MRN: [id number] Date of Birth: 08/16/1921   Medicare Observation Status Notification Given:  Yes    Adelene Amas, [place] 08/11/2021, 3:44 PM

## 2021-08-12 LAB — BPAM RBC
Blood Product Expiration Date: 202210012359
Blood Product Expiration Date: 202210242359
Blood Product Expiration Date: 202211012359
ISSUE DATE / TIME: 202209291228
ISSUE DATE / TIME: 202209291655
Unit Type and Rh: 5100
Unit Type and Rh: 9500
Unit Type and Rh: 9500

## 2021-08-12 LAB — TYPE AND SCREEN
ABO/RH(D): O NEG
Antibody Screen: NEGATIVE
Unit division: 0
Unit division: 0
Unit division: 0

## 2021-08-14 DIAGNOSIS — K5792 Diverticulitis of intestine, part unspecified, without perforation or abscess without bleeding: Secondary | ICD-10-CM | POA: Insufficient documentation

## 2021-08-14 MED ORDER — FLUTICASONE PROPIONATE 50 MCG/ACT NA SUSP: 2.0000 | Freq: Every day | NASAL | 6 refills | Status: DC

## 2021-08-14 NOTE — Telephone Encounter (Signed)
I will prescribe flonase for the stuffy nose and he can use it twice daily  Diet for diverticulitis:  Easy to digest foods like mashed potatoes,  chicken noodle soup., toast., jello.    Avoid  salads,   broccoli, cauliflower, brussel sprouts or cabbage.until bowels are back to normal.

## 2021-08-14 NOTE — Assessment & Plan Note (Signed)
Diagnosed in ED and released home on metronidazole. Sept 29 2022

## 2021-08-14 NOTE — Addendum Note (Signed)
Addended by: Crecencio Mc on: 08/14/2021 12:12 PM   Modules accepted: Orders

## 2021-08-14 NOTE — Telephone Encounter (Signed)
Patient's daughter has been notified  

## 2021-08-14 NOTE — Telephone Encounter (Signed)
Daughter calling in and states the Patient was seen in the hospital. Was given blood(2 pints), fluids, and antibiotics. Patient was diagnosed with diverticulitis.   Patient's daughter states she needs to know what foods he can eat with his diverticulitis.   His sinuses are all stopped up and he can not breathe through his nose. They are currently only using saline spray. Has Afrin but is not using it. Patient does not have a history of frequent sinus issues. Nose is congested but not running.    Patient's daughter would like a call back to know what to do about his diet and sinuses.

## 2021-08-15 ENCOUNTER — Ambulatory Visit: Payer: Medicare Other | Attending: Internal Medicine | Admitting: Physical Therapy

## 2021-08-15 ENCOUNTER — Other Ambulatory Visit: Payer: Self-pay

## 2021-08-15 ENCOUNTER — Encounter: Payer: Self-pay | Admitting: Physical Therapy

## 2021-08-15 DIAGNOSIS — M6281 Muscle weakness (generalized): Secondary | ICD-10-CM | POA: Diagnosis not present

## 2021-08-15 DIAGNOSIS — S22000A Wedge compression fracture of unspecified thoracic vertebra, initial encounter for closed fracture: Secondary | ICD-10-CM | POA: Diagnosis not present

## 2021-08-15 DIAGNOSIS — R2681 Unsteadiness on feet: Secondary | ICD-10-CM | POA: Insufficient documentation

## 2021-08-15 DIAGNOSIS — R262 Difficulty in walking, not elsewhere classified: Secondary | ICD-10-CM | POA: Diagnosis not present

## 2021-08-15 DIAGNOSIS — R918 Other nonspecific abnormal finding of lung field: Secondary | ICD-10-CM | POA: Diagnosis not present

## 2021-08-15 NOTE — Therapy (Signed)
[facility] [address] [place], [place], [address] Phone: [phone]   Fax:  [phone]  Physical Therapy Treatment  Patient Details  Name: Jason Powers MRN: [id number] Date of Birth: 1921-07-22 Referring Provider (PT): Dr. Freddie Apley. Izora Ribas   Encounter Date: 08/15/2021    PT End of Session - 08/17/21 0908     Visit Number 3    Number of Visits 17    Date for PT Re-Evaluation 09/27/21    Authorization Type Medicare, start of care 08/02/21    PT Start Time 0848    PT Stop Time 0920    PT Time Calculation (min) 32 min    Equipment Utilized During Treatment Gait belt    Activity Tolerance Patient tolerated treatment well    Behavior During Therapy WFL for tasks assessed/performed             Past Medical History:  Diagnosis Date   COPD (chronic obstructive pulmonary disease) ([place])    Glaucoma    Hypertension     Past Surgical History:  Procedure Laterality Date   APPENDECTOMY      There were no vitals filed for this visit.   Subjective Assessment - 08/17/21 0908     Subjective Patient recently went to ED and diagnosed with diverticulitis. He reports feeling a little better but is on a controlled diet; Reports no pain currently; Hasn't been able to do most of HEP due to health issues;    Patient is accompained by: Family member   Daughters: linda and [place]   Pertinent History 87 yo Male s/p fall on 06/05/21 with subsequent T9 and T12 compression fracture. He was given TLSO brace which he is supposed to wear all the time when out of bed. However he is unable to use the bathroom with brace on and therefore will take it off. he lives alone and his daughters come and check on him 3-4 times a day. He is unable to don brace by himself and therefore he does move around some without brace on. Patient denies any back pain. He does have sacral discomfort with prolonged sitting due to loss of soft tissue. He does  report difficulty with transfers and increased weakness in LE. He is currently using a rollator and requires it to help get up. Patient does express a fear of falling. He denies any numbness/tingling; Daughters prepare his meals. He does require supervision for showers for safety. Daughters help with don socks but otherwise is modified independent in dressing; He does require assistance with don/doff back brace;    How long can you sit comfortably? needs a cushion- has sacral discomfort due to loss of soft tissue    How long can you stand comfortably? <10 min    How long can you walk comfortably? short distances    Diagnostic tests MRI on 8/17: Subacute T12 compression fracture with 50% height loss and 6 mm of  retropulsion that indents the ventral cord.     Subacute T9 compression fracture with mild height loss and no  retropulsion.    Patient Stated Goals "I want to accomplish to whatever I can" He wants to get back to driving, walk with cane (not use walker if possible);    Currently in Pain? No/denies                   TREATMENT: Patient did need to use bathroom at start of session requiring min A for don/doff pants due  to wearing TLSO brace;   Standing with green tband around BLE: -hip abduction x10 reps bilaterally; -hip extension x10 reps with cues to avoid forward flexed position to isolate hip extensor activation; -side stepping x10 feet x1 lap each direction Patient required min VCs for proper positioning and to increase AROM for better hip strengthening;   Standing on airex pad: -Heel/toe raises x15 reps with BUE rail assist; Required cues to keep knee straight for better ankle strengthening;  -mini squat x10 reps, unsupported with min A for safety -alternate march x10 reps with 1-0 rail assist with cues to slow down for better challenge to stance control; Required min A for safety;  Patient reports no increase in back pain with advanced exercise. He does report mild  increase in fatigue at end of session. He had increased difficulty with standing on compliant surface, reducing rail assist requiring min a for balance.    Requires min A for transfers for safety;                         PT Short Term Goals - 08/03/21 1158       PT SHORT TERM GOAL #1   Title Patient will be adherent with HEP at least 3x a week to increase LE strength and improve mobility.    Time 4    Period Weeks    Status New    Target Date 08/31/21      PT SHORT TERM GOAL #2   Title Patient will be modified independent in transferring sit<>Stand without pushing on arm rests of chair to demonstrate improved transfer ability and increase mobility.    Time 4    Period Weeks    Status New    Target Date 08/31/21      PT SHORT TERM GOAL #3   Title Patient will improve 10 meter walk speed to >0.7 m/s to indicate improved gait safety for less fall risk and improved home ambulation.    Time 4    Period Weeks    Status New    Target Date 08/31/21               PT Long Term Goals - 08/03/21 1159       PT LONG TERM GOAL #1   Title Patient will improve BLE gross strength to >4+/5 to indicate improved functional strength for gait and standing ADLs.    Time 8    Period Weeks    Status New    Target Date 09/27/21      PT LONG TERM GOAL #2   Title Patient will improve 10 meter walk speed to >1.0 m/s with least restrictive assistive device to improve safety with home and community ambulation and reduce fall risk.    Time 8    Period Weeks    Status New    Target Date 09/27/21      PT LONG TERM GOAL #3   Title Patient will improve FOTO score to >50% to indicate improved functional mobility with ADLs.    Time 8    Period Weeks    Status New    Target Date 09/27/21      PT LONG TERM GOAL #4   Title Patient will reduce timed up and go to <14 sec with least restrictive assistive device to reduce fall risk and improve overall mobility.    Time 8     Period Weeks    Status New  Target Date 09/27/21      PT LONG TERM GOAL #5   Title Patient will ambulate short distances on level surface with SPC modified independent exhibiting good safety awareness to improve mobility and safety in home.    Time 8    Period Weeks    Status New    Target Date 09/27/21      Additional Long Term Goals   Additional Long Term Goals Yes      PT LONG TERM GOAL #6   Title Patient will improve Berg balance score to >47/56 to reduce fall risk with ADLs.    Time 8    Period Weeks    Status New    Target Date 09/27/21                   Plan - 08/17/21 0906     Clinical Impression Statement Patient present and motivated for therapy. He did require assistance in the bathroom at start of session. He has difficulty doffing/don pants with TLSO brace on. Patient instructed in advanced LE strengthening exercise. Utilized resistance band in standing for better strengthening. He does require min VCS for proper positioning including to avoid trunk lean for better hip strengthening. Patient denies any increase in back pain with advanced exercise. He was instructed in LE exercise on compliant surface. Patient required min A when reducing rail assist for stance control. He did need to leave early this session to make a doctor's appointment. He would benefit from additional skilled PT Intervention to improve strength, balance and mobility;    Personal Factors and Comorbidities Comorbidity 3+;Age;Fitness    Comorbidities Prostate issues, incontinence, arthritis, thoracic compression fracture due to recent fall    Examination-Activity Limitations Bend;Carry;Locomotion Level;Squat;Stairs;Stand;Transfers    Examination-Participation Restrictions Cleaning;Community Activity;Driving;Meal Prep;Shop;Volunteer;Yard Work    Stability/Clinical Decision Making Stable/Uncomplicated    Rehab Potential Good    PT Frequency 2x / week    PT Duration 8 weeks    PT  Treatment/Interventions ADLs/Self Care Home Management;Cryotherapy;Moist Heat;Gait training;Stair training;Functional mobility training;Therapeutic exercise;Balance training;Neuromuscular re-education;Therapeutic activities;Patient/family education;Orthotic Fit/Training;Energy conservation    PT Next Visit Plan Further assess balance, initiate HEP, strengthening    PT Home Exercise Plan will address next session    Consulted and Agree with Plan of Care Patient             Patient will benefit from skilled therapeutic intervention in order to improve the following deficits and impairments:  Abnormal gait, Decreased balance, Decreased endurance, Decreased mobility, Difficulty walking, Decreased activity tolerance, Decreased strength, Postural dysfunction  Visit Diagnosis: Muscle weakness (generalized)  Unsteadiness on feet  Difficulty in walking, not elsewhere classified     Problem List Patient Active Problem List   Diagnosis Date Noted   Diverticulitis 08/14/2021   Acute blood loss anemia 08/10/2021   Acute blood loss anemia (ABLA) 08/10/2021   Hypertension    COPD (chronic obstructive pulmonary disease) ([facility])    Sigmoid diverticulitis    Macrocytic anemia 07/18/2021   Benign prostatic hyperplasia (BPH) with urinary urge incontinence 07/18/2021   Compression fracture of thoracic vertebra ([facility]) 06/16/2021   Constipation 06/16/2021   Weight loss 06/16/2021   Bilateral leg weakness 08/30/2020   Knee pain, chronic 07/18/2020   Iron deficiency anemia 03/02/2020   Fluid retention in legs 02/27/2020   Fatigue 02/27/2020    Babygirl Trager, PT, DPT 08/17/2021, 9:09 AM  [facility] [address] [place], [place], [address] Phone: [phone]  Fax:  [phone]  Name: Jason Powers MRN: [id number] Date of Birth: [birth year]

## 2021-08-21 ENCOUNTER — Other Ambulatory Visit: Payer: Self-pay

## 2021-08-21 ENCOUNTER — Ambulatory Visit: Payer: Medicare Other | Admitting: Physical Therapy

## 2021-08-21 ENCOUNTER — Inpatient Hospital Stay
Admission: EM | Admit: 2021-08-21 | Discharge: 2021-09-01 | DRG: 871 | Disposition: A | Discharge status: Skilled Nursing Facility | Payer: Medicare Other | Attending: Internal Medicine | Admitting: Internal Medicine

## 2021-08-21 ENCOUNTER — Encounter: Payer: Self-pay | Admitting: Physical Therapy

## 2021-08-21 ENCOUNTER — Emergency Department: Payer: Medicare Other

## 2021-08-21 DIAGNOSIS — I471 Supraventricular tachycardia: Secondary | ICD-10-CM | POA: Diagnosis not present

## 2021-08-21 DIAGNOSIS — A419 Sepsis, unspecified organism: Secondary | ICD-10-CM | POA: Diagnosis not present

## 2021-08-21 DIAGNOSIS — Z87891 Personal history of nicotine dependence: Secondary | ICD-10-CM

## 2021-08-21 DIAGNOSIS — M549 Dorsalgia, unspecified: Secondary | ICD-10-CM | POA: Diagnosis not present

## 2021-08-21 DIAGNOSIS — E861 Hypovolemia: Secondary | ICD-10-CM

## 2021-08-21 DIAGNOSIS — K5732 Diverticulitis of large intestine without perforation or abscess without bleeding: Secondary | ICD-10-CM | POA: Diagnosis present

## 2021-08-21 DIAGNOSIS — D539 Nutritional anemia, unspecified: Secondary | ICD-10-CM | POA: Diagnosis not present

## 2021-08-21 DIAGNOSIS — R338 Other retention of urine: Secondary | ICD-10-CM | POA: Diagnosis not present

## 2021-08-21 DIAGNOSIS — Z6822 Body mass index (BMI) 22.0-22.9, adult: Secondary | ICD-10-CM

## 2021-08-21 DIAGNOSIS — S22000D Wedge compression fracture of unspecified thoracic vertebra, subsequent encounter for fracture with routine healing: Secondary | ICD-10-CM | POA: Diagnosis not present

## 2021-08-21 DIAGNOSIS — Z79899 Other long term (current) drug therapy: Secondary | ICD-10-CM | POA: Diagnosis not present

## 2021-08-21 DIAGNOSIS — Z9181 History of falling: Secondary | ICD-10-CM

## 2021-08-21 DIAGNOSIS — R2681 Unsteadiness on feet: Secondary | ICD-10-CM

## 2021-08-21 DIAGNOSIS — J449 Chronic obstructive pulmonary disease, unspecified: Secondary | ICD-10-CM | POA: Diagnosis present

## 2021-08-21 DIAGNOSIS — R5381 Other malaise: Secondary | ICD-10-CM | POA: Diagnosis present

## 2021-08-21 DIAGNOSIS — I9589 Other hypotension: Secondary | ICD-10-CM

## 2021-08-21 DIAGNOSIS — N1831 Chronic kidney disease, stage 3a: Secondary | ICD-10-CM | POA: Diagnosis present

## 2021-08-21 DIAGNOSIS — R410 Disorientation, unspecified: Secondary | ICD-10-CM | POA: Diagnosis present

## 2021-08-21 DIAGNOSIS — E43 Unspecified severe protein-calorie malnutrition: Secondary | ICD-10-CM | POA: Diagnosis not present

## 2021-08-21 DIAGNOSIS — I959 Hypotension, unspecified: Secondary | ICD-10-CM | POA: Diagnosis present

## 2021-08-21 DIAGNOSIS — R600 Localized edema: Secondary | ICD-10-CM | POA: Diagnosis not present

## 2021-08-21 DIAGNOSIS — R54 Age-related physical debility: Secondary | ICD-10-CM | POA: Diagnosis not present

## 2021-08-21 DIAGNOSIS — N401 Enlarged prostate with lower urinary tract symptoms: Secondary | ICD-10-CM | POA: Diagnosis present

## 2021-08-21 DIAGNOSIS — S22080A Wedge compression fracture of T11-T12 vertebra, initial encounter for closed fracture: Secondary | ICD-10-CM | POA: Diagnosis not present

## 2021-08-21 DIAGNOSIS — Z66 Do not resuscitate: Secondary | ICD-10-CM | POA: Diagnosis present

## 2021-08-21 DIAGNOSIS — N179 Acute kidney failure, unspecified: Secondary | ICD-10-CM | POA: Diagnosis not present

## 2021-08-21 DIAGNOSIS — K5792 Diverticulitis of intestine, part unspecified, without perforation or abscess without bleeding: Secondary | ICD-10-CM | POA: Diagnosis not present

## 2021-08-21 DIAGNOSIS — N200 Calculus of kidney: Secondary | ICD-10-CM | POA: Diagnosis not present

## 2021-08-21 DIAGNOSIS — Z043 Encounter for examination and observation following other accident: Secondary | ICD-10-CM | POA: Diagnosis not present

## 2021-08-21 DIAGNOSIS — W19XXXA Unspecified fall, initial encounter: Secondary | ICD-10-CM

## 2021-08-21 DIAGNOSIS — J8410 Pulmonary fibrosis, unspecified: Secondary | ICD-10-CM | POA: Diagnosis not present

## 2021-08-21 DIAGNOSIS — M6281 Muscle weakness (generalized): Secondary | ICD-10-CM

## 2021-08-21 DIAGNOSIS — R262 Difficulty in walking, not elsewhere classified: Secondary | ICD-10-CM

## 2021-08-21 DIAGNOSIS — Z20822 Contact with and (suspected) exposure to covid-19: Secondary | ICD-10-CM | POA: Diagnosis present

## 2021-08-21 DIAGNOSIS — D509 Iron deficiency anemia, unspecified: Secondary | ICD-10-CM | POA: Diagnosis present

## 2021-08-21 DIAGNOSIS — I129 Hypertensive chronic kidney disease with stage 1 through stage 4 chronic kidney disease, or unspecified chronic kidney disease: Secondary | ICD-10-CM | POA: Diagnosis present

## 2021-08-21 DIAGNOSIS — H04123 Dry eye syndrome of bilateral lacrimal glands: Secondary | ICD-10-CM | POA: Diagnosis not present

## 2021-08-21 DIAGNOSIS — E860 Dehydration: Secondary | ICD-10-CM | POA: Diagnosis not present

## 2021-08-21 DIAGNOSIS — I10 Essential (primary) hypertension: Secondary | ICD-10-CM | POA: Diagnosis not present

## 2021-08-21 DIAGNOSIS — R6520 Severe sepsis without septic shock: Secondary | ICD-10-CM | POA: Diagnosis present

## 2021-08-21 DIAGNOSIS — M4802 Spinal stenosis, cervical region: Secondary | ICD-10-CM | POA: Diagnosis present

## 2021-08-21 DIAGNOSIS — L89151 Pressure ulcer of sacral region, stage 1: Secondary | ICD-10-CM | POA: Diagnosis not present

## 2021-08-21 DIAGNOSIS — H409 Unspecified glaucoma: Secondary | ICD-10-CM | POA: Diagnosis not present

## 2021-08-21 DIAGNOSIS — S13140A Subluxation of C3/C4 cervical vertebrae, initial encounter: Secondary | ICD-10-CM | POA: Diagnosis not present

## 2021-08-21 DIAGNOSIS — S0990XA Unspecified injury of head, initial encounter: Secondary | ICD-10-CM | POA: Diagnosis not present

## 2021-08-21 DIAGNOSIS — Z23 Encounter for immunization: Secondary | ICD-10-CM | POA: Diagnosis not present

## 2021-08-21 DIAGNOSIS — R6889 Other general symptoms and signs: Secondary | ICD-10-CM | POA: Diagnosis not present

## 2021-08-21 DIAGNOSIS — R011 Cardiac murmur, unspecified: Secondary | ICD-10-CM | POA: Diagnosis not present

## 2021-08-21 DIAGNOSIS — E876 Hypokalemia: Secondary | ICD-10-CM | POA: Diagnosis not present

## 2021-08-21 DIAGNOSIS — R1312 Dysphagia, oropharyngeal phase: Secondary | ICD-10-CM | POA: Diagnosis not present

## 2021-08-21 DIAGNOSIS — J9811 Atelectasis: Secondary | ICD-10-CM | POA: Diagnosis not present

## 2021-08-21 DIAGNOSIS — Z7401 Bed confinement status: Secondary | ICD-10-CM | POA: Diagnosis not present

## 2021-08-21 DIAGNOSIS — R0902 Hypoxemia: Secondary | ICD-10-CM | POA: Diagnosis not present

## 2021-08-21 DIAGNOSIS — M4312 Spondylolisthesis, cervical region: Secondary | ICD-10-CM | POA: Diagnosis not present

## 2021-08-21 DIAGNOSIS — I700 Atherosclerosis of aorta: Secondary | ICD-10-CM | POA: Diagnosis not present

## 2021-08-21 DIAGNOSIS — W19XXXD Unspecified fall, subsequent encounter: Secondary | ICD-10-CM | POA: Diagnosis not present

## 2021-08-21 DIAGNOSIS — Z743 Need for continuous supervision: Secondary | ICD-10-CM | POA: Diagnosis not present

## 2021-08-21 DIAGNOSIS — K5730 Diverticulosis of large intestine without perforation or abscess without bleeding: Secondary | ICD-10-CM | POA: Diagnosis not present

## 2021-08-21 DIAGNOSIS — R519 Headache, unspecified: Secondary | ICD-10-CM | POA: Diagnosis not present

## 2021-08-21 DIAGNOSIS — S22070A Wedge compression fracture of T9-T10 vertebra, initial encounter for closed fracture: Secondary | ICD-10-CM | POA: Diagnosis not present

## 2021-08-21 LAB — TROPONIN I (HIGH SENSITIVITY)
Troponin I (High Sensitivity): 26 ng/L — ABNORMAL HIGH (ref <18)
Troponin I (High Sensitivity): 34 ng/L — ABNORMAL HIGH (ref <18)

## 2021-08-21 LAB — URINALYSIS, ROUTINE W REFLEX MICROSCOPIC
Bilirubin Urine: NEGATIVE
Glucose, UA: NEGATIVE mg/dL
Hgb urine dipstick: NEGATIVE
Ketones, ur: NEGATIVE mg/dL
Nitrite: NEGATIVE
Protein, ur: NEGATIVE mg/dL
Specific Gravity, Urine: 1.016 (ref 1.005–1.030)
Squamous Epithelial / HPF: NONE SEEN (ref 0–5)
pH: 5 (ref 5.0–8.0)

## 2021-08-21 LAB — CBC WITH DIFFERENTIAL/PLATELET
Abs Immature Granulocytes: 0.38 10*3/uL — ABNORMAL HIGH (ref 0.00–0.07)
Basophils Absolute: 0.1 10*3/uL (ref 0.0–0.1)
Basophils Relative: 0 %
Eosinophils Absolute: 0 10*3/uL (ref 0.0–0.5)
Eosinophils Relative: 0 %
HCT: 31.7 % — ABNORMAL LOW (ref 39.0–52.0)
Hemoglobin: 10.6 g/dL — ABNORMAL LOW (ref 13.0–17.0)
Immature Granulocytes: 1 %
Lymphocytes Relative: 1 %
Lymphs Abs: 0.3 10*3/uL — ABNORMAL LOW (ref 0.7–4.0)
MCH: 35.2 pg — ABNORMAL HIGH (ref 26.0–34.0)
MCHC: 33.4 g/dL (ref 30.0–36.0)
MCV: 105.3 fL — ABNORMAL HIGH (ref 80.0–100.0)
Monocytes Absolute: 1.2 10*3/uL — ABNORMAL HIGH (ref 0.1–1.0)
Monocytes Relative: 4 %
Neutro Abs: 27.4 10*3/uL — ABNORMAL HIGH (ref 1.7–7.7)
Neutrophils Relative %: 94 %
Platelets: 278 10*3/uL (ref 150–400)
RBC: 3.01 MIL/uL — ABNORMAL LOW (ref 4.22–5.81)
RDW: 18.6 % — ABNORMAL HIGH (ref 11.5–15.5)
Smear Review: ADEQUATE
WBC: 29.5 10*3/uL — ABNORMAL HIGH (ref 4.0–10.5)
nRBC: 0 % (ref 0.0–0.2)

## 2021-08-21 LAB — BASIC METABOLIC PANEL
Anion gap: 13 (ref 5–15)
BUN: 31 mg/dL — ABNORMAL HIGH (ref 8–23)
CO2: 27 mmol/L (ref 22–32)
Calcium: 8.5 mg/dL — ABNORMAL LOW (ref 8.9–10.3)
Chloride: 93 mmol/L — ABNORMAL LOW (ref 98–111)
Creatinine, Ser: 1.23 mg/dL (ref 0.61–1.24)
GFR, Estimated: 53 mL/min — ABNORMAL LOW (ref >60)
Glucose, Bld: 188 mg/dL — ABNORMAL HIGH (ref 70–99)
Potassium: 3.3 mmol/L — ABNORMAL LOW (ref 3.5–5.1)
Sodium: 133 mmol/L — ABNORMAL LOW (ref 135–145)

## 2021-08-21 LAB — HEPATIC FUNCTION PANEL
ALT: 19 U/L (ref 0–44)
AST: 26 U/L (ref 15–41)
Albumin: 3.6 g/dL (ref 3.5–5.0)
Alkaline Phosphatase: 45 U/L (ref 38–126)
Bilirubin, Direct: 0.3 mg/dL — ABNORMAL HIGH (ref 0.0–0.2)
Indirect Bilirubin: 1.3 mg/dL — ABNORMAL HIGH (ref 0.3–0.9)
Total Bilirubin: 1.6 mg/dL — ABNORMAL HIGH (ref 0.3–1.2)
Total Protein: 6.3 g/dL — ABNORMAL LOW (ref 6.5–8.1)

## 2021-08-21 LAB — RESP PANEL BY RT-PCR (FLU A&B, COVID) ARPGX2
Influenza A by PCR: NEGATIVE
Influenza B by PCR: NEGATIVE
SARS Coronavirus 2 by RT PCR: NEGATIVE

## 2021-08-21 LAB — LIPASE, BLOOD: Lipase: 37 U/L (ref 11–51)

## 2021-08-21 LAB — BRAIN NATRIURETIC PEPTIDE: B Natriuretic Peptide: 69.1 pg/mL (ref 0.0–100.0)

## 2021-08-21 LAB — LACTIC ACID, PLASMA: Lactic Acid, Venous: 1.4 mmol/L (ref 0.5–1.9)

## 2021-08-21 MED ORDER — ACETAMINOPHEN 500 MG PO TABS
1000.0000 mg | ORAL_TABLET | Freq: Once | ORAL | Status: AC
  Administered 2021-08-21: 1000 mg via ORAL
  Filled 2021-08-21: qty 2

## 2021-08-21 MED ORDER — IOHEXOL 350 MG/ML SOLN
75.0000 mL | Freq: Once | INTRAVENOUS | Status: AC | PRN
  Administered 2021-08-21: 75 mL via INTRAVENOUS

## 2021-08-21 MED ORDER — TETANUS-DIPHTH-ACELL PERTUSSIS 5-2.5-18.5 LF-MCG/0.5 IM SUSY
0.5000 mL | PREFILLED_SYRINGE | Freq: Once | INTRAMUSCULAR | Status: AC
  Administered 2021-08-21: 0.5 mL via INTRAMUSCULAR
  Filled 2021-08-21: qty 0.5

## 2021-08-21 MED ORDER — PIPERACILLIN-TAZOBACTAM 3.375 G IVPB 30 MIN
3.3750 g | Freq: Once | INTRAVENOUS | Status: AC
  Administered 2021-08-21: 3.375 g via INTRAVENOUS
  Filled 2021-08-21: qty 50

## 2021-08-21 MED ORDER — SODIUM CHLORIDE 0.9 % IV BOLUS
1000.0000 mL | Freq: Once | INTRAVENOUS | Status: AC
  Administered 2021-08-21: 1000 mL via INTRAVENOUS

## 2021-08-21 NOTE — ED Notes (Signed)
Call patient's daughter with disposition [name] or [place])

## 2021-08-21 NOTE — ED Notes (Signed)
Patient in radiology

## 2021-08-21 NOTE — ED Provider Notes (Signed)
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 mg (1,000 mg Oral Given 08/21/21 2037)  Tdap (BOOSTRIX) injection 0.5 mL (0.5 mLs Intramuscular Given 08/21/21 2035)  iohexol (OMNIPAQUE) 350 MG/ML injection 75 mL (75 mLs Intravenous Contrast Given 08/21/21 2128)     ED Discharge Orders     None        Note:  This document was prepared using Dragon voice recognition software and may include unintentional dictation errors.    Vanessa [place], MD 08/21/21 [phone]

## 2021-08-21 NOTE — Therapy (Signed)
[place] [facility]IN [facility] SERVICES [address] [place], [place], [address] Phone: [phone]   Fax:  [phone]  Physical Therapy Treatment  Patient Details  Name: Jason Powers MRN: [id number] Date of Birth: 1921-04-17 Referring Provider (PT): Dr. Freddie Apley. Izora Ribas   Encounter Date: 08/21/2021   PT End of Session - 08/21/21 1058     Visit Number 4    Number of Visits 17    Date for PT Re-Evaluation 09/27/21    Authorization Type Medicare, start of care 08/02/21    PT Start Time 0932    PT Stop Time 1015    PT Time Calculation (min) 43 min    Equipment Utilized During Treatment Gait belt    Activity Tolerance Patient tolerated treatment well    Behavior During Therapy WFL for tasks assessed/performed             Past Medical History:  Diagnosis Date   COPD (chronic obstructive pulmonary disease) ([place])    Glaucoma    Hypertension     Past Surgical History:  Procedure Laterality Date   APPENDECTOMY      There were no vitals filed for this visit.   Subjective Assessment - 08/21/21 0941     Subjective Patient reports doing well. His orthopedic MD did remove the TLSO brace from daily wear. He had X-rays which looked good.    Patient is accompained by: Family member   Daughters: linda and [place]   Pertinent History 87 yo Male s/p fall on 06/05/21 with subsequent T9 and T12 compression fracture. He was given TLSO brace which he is supposed to wear all the time when out of bed. However he is unable to use the bathroom with brace on and therefore will take it off. he lives alone and his daughters come and check on him 3-4 times a day. He is unable to don brace by himself and therefore he does move around some without brace on. Patient denies any back pain. He does have sacral discomfort with prolonged sitting due to loss of soft tissue. He does report difficulty with transfers and increased weakness in LE. He is currently using a  rollator and requires it to help get up. Patient does express a fear of falling. He denies any numbness/tingling; Daughters prepare his meals. He does require supervision for showers for safety. Daughters help with don socks but otherwise is modified independent in dressing; He does require assistance with don/doff back brace;    How long can you sit comfortably? needs a cushion- has sacral discomfort due to loss of soft tissue    How long can you stand comfortably? <10 min    How long can you walk comfortably? short distances    Diagnostic tests MRI on 8/17: Subacute T12 compression fracture with 50% height loss and 6 mm of  retropulsion that indents the ventral cord.     Subacute T9 compression fracture with mild height loss and no  retropulsion.    Patient Stated Goals "I want to accomplish to whatever I can" He wants to get back to driving, walk with cane (not use walker if possible);    Currently in Pain? No/denies               TREATMENT: Warm up on Nustep BUE/BLE level 3 x4 min with cues to increase steps per minute >50 for increased cardiovascular challenge;   Reinforced HEP: Seated with green tband: -hip flexion march x15 reps each LE -hip abduction/ER  x15 reps; -LAQ x15 reps each LE Patient required mod VCs for proper positioning including erect posture and to increase AROM for better strengthening. He also required cues to keep one foot on ground during LAQ for better resistance;  Sit<>Stand, pushing up from chair with chair rail x5 reps with cues for erect posture;   Instructed patient in gait on level surface, x150 feet (3 min) with RW, with mod VCs to increase erect posture and increase step length; Pt reports moderate fatigue with advanced gait techniques;    Standing on airex pad: -Heel/toe raises x15 reps with BUE rail assist; Required cues to keep knee straight for better ankle strengthening;  -Feet apart: unsupported, x30 sec  Progressed to lateral head turns  side/side x5 reps each  Progressed to verical head turns x5 reps each -forward/backward step ups with 2-1 rail assist, firm to airex pad x10 reps; Required min A for balance and mod VCs for erect posture. Patient often slumps forward with thoracic kyphosis. He also has difficulty reducing rail assist.     Patient tolerated session well. He does report increased fatigue at end of session. Advanced HEP with walking program to increase stamina and cardiovascular endurance;                  PT Education - 08/21/21 1058     Education Details positioning, strengthening, HEP    Person(s) Educated Patient;Child(ren)    Methods Explanation;Verbal cues    Comprehension Verbalized understanding;Returned demonstration;Verbal cues required;Need further instruction              PT Short Term Goals - 08/03/21 1158       PT SHORT TERM GOAL #1   Title Patient will be adherent with HEP at least 3x a week to increase LE strength and improve mobility.    Time 4    Period Weeks    Status New    Target Date 08/31/21      PT SHORT TERM GOAL #2   Title Patient will be modified independent in transferring sit<>Stand without pushing on arm rests of chair to demonstrate improved transfer ability and increase mobility.    Time 4    Period Weeks    Status New    Target Date 08/31/21      PT SHORT TERM GOAL #3   Title Patient will improve 10 meter walk speed to >0.7 m/s to indicate improved gait safety for less fall risk and improved home ambulation.    Time 4    Period Weeks    Status New    Target Date 08/31/21               PT Long Term Goals - 08/03/21 1159       PT LONG TERM GOAL #1   Title Patient will improve BLE gross strength to >4+/5 to indicate improved functional strength for gait and standing ADLs.    Time 8    Period Weeks    Status New    Target Date 09/27/21      PT LONG TERM GOAL #2   Title Patient will improve 10 meter walk speed to >1.0 m/s with  least restrictive assistive device to improve safety with home and community ambulation and reduce fall risk.    Time 8    Period Weeks    Status New    Target Date 09/27/21      PT LONG TERM GOAL #3   Title Patient will improve FOTO  score to >50% to indicate improved functional mobility with ADLs.    Time 8    Period Weeks    Status New    Target Date 09/27/21      PT LONG TERM GOAL #4   Title Patient will reduce timed up and go to <14 sec with least restrictive assistive device to reduce fall risk and improve overall mobility.    Time 8    Period Weeks    Status New    Target Date 09/27/21      PT LONG TERM GOAL #5   Title Patient will ambulate short distances on level surface with SPC modified independent exhibiting good safety awareness to improve mobility and safety in home.    Time 8    Period Weeks    Status New    Target Date 09/27/21      Additional Long Term Goals   Additional Long Term Goals Yes      PT LONG TERM GOAL #6   Title Patient will improve Berg balance score to >47/56 to reduce fall risk with ADLs.    Time 8    Period Weeks    Status New    Target Date 09/27/21                   Plan - 08/21/21 1058     Clinical Impression Statement Patient motivated and participated well within session. He denies any increase in back pain with advanced exercise. Instructed patient in advanced LE strengthening, progressing resistance with green tband and increasing repetition as tolerated. He does require cues for erect posture and to increase AROM for better strengthening. Patient does fatigue with prolonged activity. Advanced HEP with walking program. He would benefit from additional skilled PT Intervention to improve strength, balance and mobility;    Personal Factors and Comorbidities Comorbidity 3+;Age;Fitness    Comorbidities Prostate issues, incontinence, arthritis, thoracic compression fracture due to recent fall    Examination-Activity Limitations  Bend;Carry;Locomotion Level;Squat;Stairs;Stand;Transfers    Examination-Participation Restrictions Cleaning;Community Activity;Driving;Meal Prep;Shop;Volunteer;Yard Work    Stability/Clinical Decision Making Stable/Uncomplicated    Rehab Potential Good    PT Frequency 2x / week    PT Duration 8 weeks    PT Treatment/Interventions ADLs/Self Care Home Management;Cryotherapy;Moist Heat;Gait training;Stair training;Functional mobility training;Therapeutic exercise;Balance training;Neuromuscular re-education;Therapeutic activities;Patient/family education;Orthotic Fit/Training;Energy conservation    PT Next Visit Plan Further assess balance, initiate HEP, strengthening    PT Home Exercise Plan will address next session    Consulted and Agree with Plan of Care Patient             Patient will benefit from skilled therapeutic intervention in order to improve the following deficits and impairments:  Abnormal gait, Decreased balance, Decreased endurance, Decreased mobility, Difficulty walking, Decreased activity tolerance, Decreased strength, Postural dysfunction  Visit Diagnosis: Muscle weakness (generalized)  Unsteadiness on feet  Difficulty in walking, not elsewhere classified     Problem List Patient Active Problem List   Diagnosis Date Noted   Diverticulitis 08/14/2021   Acute blood loss anemia 08/10/2021   Acute blood loss anemia ([facility]) 08/10/2021   Hypertension    COPD (chronic obstructive pulmonary disease) ([facility])    Sigmoid diverticulitis    Macrocytic anemia 07/18/2021   Benign prostatic hyperplasia (BPH) with urinary urge incontinence 07/18/2021   Compression fracture of thoracic vertebra ([facility]) 06/16/2021   Constipation 06/16/2021   Weight loss 06/16/2021   Bilateral leg weakness 08/30/2020   Knee pain, chronic 07/18/2020   Iron deficiency  anemia 03/02/2020   Fluid retention in legs 02/27/2020   Fatigue 02/27/2020    Evi Mccomb, PT, DPT 08/21/2021, 2:01  PM  [place] [facility]IN [facility] SERVICES [address] [place], [place], [address] Phone: [phone]   Fax:  [phone]  Name: Denson Niccoli MRN: [id number] Date of Birth: 1921/08/15

## 2021-08-21 NOTE — Sepsis Progress Note (Signed)
Following for sepsis monitoring ?

## 2021-08-21 NOTE — ED Triage Notes (Signed)
Patient arrived via EMS after having a fall. Per EMS, pt was found on the floor after falling off the toilet seat. Pt states he hit his head, but denies LOC. Pt is alert and oriented to self only. Reports back pain and a headache.

## 2021-08-21 NOTE — Patient Instructions (Addendum)
Walk around the home, for 2 -3 min at a time, 2x a day  Do this purposefully, working on walking with good posture and taking long steps;

## 2021-08-21 NOTE — ED Notes (Signed)
MD at the bedside  

## 2021-08-22 ENCOUNTER — Inpatient Hospital Stay: Payer: Medicare Other

## 2021-08-22 DIAGNOSIS — M4802 Spinal stenosis, cervical region: Secondary | ICD-10-CM

## 2021-08-22 DIAGNOSIS — A419 Sepsis, unspecified organism: Secondary | ICD-10-CM

## 2021-08-22 DIAGNOSIS — K5792 Diverticulitis of intestine, part unspecified, without perforation or abscess without bleeding: Secondary | ICD-10-CM | POA: Diagnosis not present

## 2021-08-22 DIAGNOSIS — E876 Hypokalemia: Secondary | ICD-10-CM

## 2021-08-22 DIAGNOSIS — N179 Acute kidney failure, unspecified: Secondary | ICD-10-CM

## 2021-08-22 DIAGNOSIS — I9589 Other hypotension: Secondary | ICD-10-CM | POA: Diagnosis not present

## 2021-08-22 LAB — CBC
HCT: 33.8 % — ABNORMAL LOW (ref 39.0–52.0)
Hemoglobin: 10.8 g/dL — ABNORMAL LOW (ref 13.0–17.0)
MCH: 33.3 pg (ref 26.0–34.0)
MCHC: 32 g/dL (ref 30.0–36.0)
MCV: 104.3 fL — ABNORMAL HIGH (ref 80.0–100.0)
Platelets: 270 10*3/uL (ref 150–400)
RBC: 3.24 MIL/uL — ABNORMAL LOW (ref 4.22–5.81)
RDW: 18.7 % — ABNORMAL HIGH (ref 11.5–15.5)
WBC: 26.2 10*3/uL — ABNORMAL HIGH (ref 4.0–10.5)
nRBC: 0 % (ref 0.0–0.2)

## 2021-08-22 LAB — LACTIC ACID, PLASMA: Lactic Acid, Venous: 1.3 mmol/L (ref 0.5–1.9)

## 2021-08-22 LAB — BASIC METABOLIC PANEL
Anion gap: 10 (ref 5–15)
BUN: 29 mg/dL — ABNORMAL HIGH (ref 8–23)
CO2: 27 mmol/L (ref 22–32)
Calcium: 8.4 mg/dL — ABNORMAL LOW (ref 8.9–10.3)
Chloride: 100 mmol/L (ref 98–111)
Creatinine, Ser: 1.11 mg/dL (ref 0.61–1.24)
GFR, Estimated: >60 mL/min (ref >60)
Glucose, Bld: 148 mg/dL — ABNORMAL HIGH (ref 70–99)
Potassium: 3.2 mmol/L — ABNORMAL LOW (ref 3.5–5.1)
Sodium: 137 mmol/L (ref 135–145)

## 2021-08-22 LAB — URINE CULTURE: Culture: NO GROWTH

## 2021-08-22 MED ORDER — FERROUS FUMARATE 324 (106 FE) MG PO TABS
1.0000 | ORAL_TABLET | Freq: Every day | ORAL | Status: DC
  Administered 2021-08-23 – 2021-08-28 (×6): 106 mg via ORAL
  Filled 2021-08-22 (×8): qty 1

## 2021-08-22 MED ORDER — ENOXAPARIN SODIUM 30 MG/0.3ML IJ SOSY
30.0000 mg | PREFILLED_SYRINGE | INTRAMUSCULAR | Status: DC
  Administered 2021-08-22: 30 mg via SUBCUTANEOUS
  Filled 2021-08-22: qty 0.3

## 2021-08-22 MED ORDER — PIPERACILLIN-TAZOBACTAM 3.375 G IVPB
3.3750 g | Freq: Three times a day (TID) | INTRAVENOUS | Status: DC
Start: 2021-08-22 — End: 2021-08-27
  Administered 2021-08-22 – 2021-08-27 (×16): 3.375 g via INTRAVENOUS
  Filled 2021-08-22 (×16): qty 50

## 2021-08-22 MED ORDER — SODIUM CHLORIDE 0.9 % IV SOLN
INTRAVENOUS | Status: DC
  Administered 2021-08-22: 1000 mL via INTRAVENOUS

## 2021-08-22 MED ORDER — TAMSULOSIN HCL 0.4 MG PO CAPS
0.4000 mg | ORAL_CAPSULE | Freq: Every day | ORAL | Status: DC
  Administered 2021-08-22 – 2021-09-01 (×11): 0.4 mg via ORAL
  Filled 2021-08-22 (×11): qty 1

## 2021-08-22 MED ORDER — POTASSIUM CHLORIDE CRYS ER 20 MEQ PO TBCR
40.0000 meq | EXTENDED_RELEASE_TABLET | Freq: Two times a day (BID) | ORAL | Status: DC
  Administered 2021-08-22: 40 meq via ORAL
  Filled 2021-08-22: qty 2

## 2021-08-22 MED ORDER — POTASSIUM CHLORIDE 10 MEQ/100ML IV SOLN
10.0000 meq | INTRAVENOUS | Status: AC
Start: 2021-08-22 — End: 2021-08-22
  Administered 2021-08-22 (×2): 10 meq via INTRAVENOUS
  Filled 2021-08-22 (×2): qty 100

## 2021-08-22 MED ORDER — ENOXAPARIN SODIUM 40 MG/0.4ML IJ SOSY: 40.0000 mg | PREFILLED_SYRINGE | INTRAMUSCULAR | Status: DC

## 2021-08-22 MED ORDER — ENOXAPARIN SODIUM 40 MG/0.4ML IJ SOSY
40.0000 mg | PREFILLED_SYRINGE | INTRAMUSCULAR | Status: DC
  Administered 2021-08-23 – 2021-09-01 (×10): 40 mg via SUBCUTANEOUS
  Filled 2021-08-22 (×10): qty 0.4

## 2021-08-22 MED ORDER — FERROUS FUM-IRON POLYSACCH 162-115.2 MG PO CAPS: 1.0000 | ORAL_CAPSULE | Freq: Every day | ORAL | Status: DC

## 2021-08-22 MED ORDER — CHLORHEXIDINE GLUCONATE CLOTH 2 % EX PADS
6.0000 | MEDICATED_PAD | Freq: Every day | CUTANEOUS | Status: DC
  Administered 2021-08-22 – 2021-09-01 (×10): 6 via TOPICAL

## 2021-08-22 MED ORDER — ACETAMINOPHEN 325 MG PO TABS
650.0000 mg | ORAL_TABLET | Freq: Four times a day (QID) | ORAL | Status: DC | PRN
  Administered 2021-08-22: 650 mg via ORAL
  Filled 2021-08-22: qty 2

## 2021-08-22 NOTE — Progress Notes (Signed)
Pharmacy Antibiotic Note  Jason Powers is a 87 y.o. male admitted on 08/21/2021 with intra-abdominal infection.  Pharmacy has been consulted for Zosyn dosing.  Plan: Zosyn 3.375g IV q8h (4 hour infusion).  Height: 5\' 8"  (172.7 cm) Weight: 59 kg (130 lb) IBW/kg (Calculated) : 68.4  Temp (24hrs), Avg:98.4 F (36.9 C), Min:98.4 F (36.9 C), Max:98.4 F (36.9 C)  Recent Labs  Lab 08/21/21 1959 08/21/21 2233  WBC 29.5*  --   CREATININE 1.23  --   LATICACIDVEN  --  1.4    Estimated Creatinine Clearance: 28 mL/min (by C-G formula based on SCr of 1.23 mg/dL).    No Known Allergies  Antimicrobials this admission:   >>    >>   Dose adjustments this admission:   Microbiology results:  BCx:   UCx:    Sputum:    MRSA PCR:   Thank you for allowing pharmacy to be a part of this patient's care.  Jasan Doughtie D 08/22/2021 2:15 AM

## 2021-08-22 NOTE — H&P (Addendum)
History and Physical    Jason Powers NOM:[id number] DOB: 12/03/1920 DOA: 08/21/2021  PCP: Crecencio Mc, MD  Patient coming from: Home  I have personally briefly reviewed patient's old medical records in [place]  Chief Complaint: Fall  HPI: Vaughan Garfinkle is a 87 y.o. male with medical history significant for COPD, hypertension, iron deficiency anemia, BPH, history of thoracic compression fracture who presents with concerns of fall.  Patient reports that he was sitting on the toilet when he slid and fell.  Otherwise appears to have dementia and unable to provide any further history.  No family at bedside.  Reportedly on exam by ED physician, patient complained of pain all over.  ED Course: He was afebrile, mildly hypotensive in the 90s over 40s.  Has leukocytosis of 29, hemoglobin stable at 10.6, platelet of 278.  Sodium of 133, potassium of 3.3, creatinine of 1.23 from prior of 0.86, BG of 88.  CT of the head was negative.  CT cervical spine showed C3 on C4 subluxation with possible ligamentous injury.  MRI C-spine pending at time of admission.  Patient has on c-collar. CTA of the chest showed no PE, showed a chronic compression fracture of T9 and T12 that is unchanged.  Findings of possible chronic outlet obstruction.   CT abdomen/proximal had findings suggestive of early acute diverticulitis without abscess  Review of Systems: Unable to obtain for ROS due to likely diabetic diet continue  Past Medical History:  Diagnosis Date   COPD (chronic obstructive pulmonary disease) ([place])    Glaucoma    Hypertension     Past Surgical History:  Procedure Laterality Date   APPENDECTOMY       reports that he quit smoking about 58 years ago. His smoking use included cigarettes. He has never used smokeless tobacco. He reports that he does not drink alcohol and does not use drugs. Social History  No Known Allergies  Family History  Problem Relation Age of Onset   Drug  abuse Brother    Cancer Brother      Prior to Admission medications   Medication Sig Start Date End Date Taking? Authorizing Provider  Acetaminophen 325 MG CAPS Take 2 capsules by mouth every 6 (six) hours as needed.   Yes [provider]  brinzolamide (AZOPT) 1 % ophthalmic suspension Place 1 drop into both eyes 2 (two) times daily.   Yes [provider]  ferrous fumarate-iron polysaccharide complex (TANDEM) 162-115.2 MG CAPS capsule Take 1 capsule by mouth daily with breakfast. 03/02/20  Yes Crecencio Mc, MD  fluticasone (FLONASE) 50 MCG/ACT nasal spray Place 2 sprays into both nostrils daily. 08/14/21  Yes Crecencio Mc, MD  magnesium hydroxide (MILK OF MAGNESIA) 400 MG/5ML suspension Take by mouth daily as needed for mild constipation.   Yes [provider]  tamsulosin (FLOMAX) 0.4 MG CAPS capsule Take 1 capsule (0.4 mg total) by mouth daily. 07/18/21  Yes Leone Haven, MD  hydrochlorothiazide (HYDRODIURIL) 25 MG tablet Hold until followup with outpatient provider since your blood pressure was normal without this. Patient not taking: Reported on 08/21/2021 08/11/21   Enzo Bi, MD    Physical Exam: Vitals:   08/21/21 2300 08/21/21 2330 08/22/21 0000 08/22/21 0030  BP: (!) 100/49 (!) 99/47 (!) 93/53 (!) 94/52  Pulse: 75 83 88 75  Resp: (!) 23 (!) 23 (!) 23 20  Temp:      TempSrc:      SpO2: 95% 95% 96% 98%  Weight:      Height:        Constitutional: NAD, thin confused elderly male laying flat in bed.  Patient unable to answer much questions appropriately.  Was yelling out into the hallway at times Vitals:   08/21/21 2300 08/21/21 2330 08/22/21 0000 08/22/21 0030  BP: (!) 100/49 (!) 99/47 (!) 93/53 (!) 94/52  Pulse: 75 83 88 75  Resp: (!) 23 (!) 23 (!) 23 20  Temp:      TempSrc:      SpO2: 95% 95% 96% 98%  Weight:      Height:       Eyes: PERRL, lids and conjunctivae normal ENMT: Mucous membranes are moist.  Neck: normal,  supple Respiratory: clear to auscultation bilaterally, no wheezing, no crackles. Normal respiratory effort. No accessory muscle use.  Cardiovascular: Regular rate and rhythm, no murmurs / rubs / gallops. No extremity edema. 2+ pedal pulses. Abdomen: Soft, nondistended, had grimacing with abdominal palpation throughout all quadrants. Bowel sounds positive.  Musculoskeletal: no clubbing / cyanosis. No joint deformity upper and lower extremities.  Skin: no rashes, lesions, ulcers. No induration Neurologic: Alert but disoriented.  CN 2-12 grossly intact.  Able to move all extremities.  Strength 3/5 in all 4.  Psychiatric: Alert but disoriented.  Labs on Admission: I have personally reviewed following labs and imaging studies  CBC: Recent Labs  Lab 08/21/21 1959  WBC 29.5*  NEUTROABS 27.4*  HGB 10.6*  HCT 31.7*  MCV 105.3*  PLT 283   Basic Metabolic Panel: Recent Labs  Lab 08/21/21 1959  NA 133*  K 3.3*  CL 93*  CO2 27  GLUCOSE 188*  BUN 31*  CREATININE 1.23  CALCIUM 8.5*   GFR: Estimated Creatinine Clearance: 28 mL/min (by C-G formula based on SCr of 1.23 mg/dL). Liver Function Tests: Recent Labs  Lab 08/21/21 1959  AST 26  ALT 19  ALKPHOS 45  BILITOT 1.6*  PROT 6.3*  ALBUMIN 3.6   Recent Labs  Lab 08/21/21 1959  LIPASE 37   No results for input(s): AMMONIA in the last 168 hours. Coagulation Profile: No results for input(s): INR, PROTIME in the last 168 hours. Cardiac Enzymes: No results for input(s): CKTOTAL, CKMB, CKMBINDEX, TROPONINI in the last 168 hours. BNP (last 3 results) No results for input(s): PROBNP in the last 8760 hours. HbA1C: No results for input(s): HGBA1C in the last 72 hours. CBG: No results for input(s): GLUCAP in the last 168 hours. Lipid Profile: No results for input(s): CHOL, HDL, LDLCALC, TRIG, CHOLHDL, LDLDIRECT in the last 72 hours. Thyroid Function Tests: No results for input(s): TSH, T4TOTAL, FREET4, T3FREE, THYROIDAB in the  last 72 hours. Anemia Panel: No results for input(s): VITAMINB12, FOLATE, FERRITIN, TIBC, IRON, RETICCTPCT in the last 72 hours. Urine analysis:    Component Value Date/Time   COLORURINE YELLOW (A) 08/21/2021 1959   APPEARANCEUR HAZY (A) 08/21/2021 1959   LABSPEC 1.016 08/21/2021 1959   PHURINE 5.0 08/21/2021 1959   GLUCOSEU NEGATIVE 08/21/2021 1959   GLUCOSEU NEGATIVE 07/27/2021 [place] 08/21/2021 1959   BILIRUBINUR NEGATIVE 08/21/2021 1959   BILIRUBINUR negative 07/25/2021 [place] 08/21/2021 1959   PROTEINUR NEGATIVE 08/21/2021 1959   UROBILINOGEN 0.2 07/27/2021 1051   NITRITE NEGATIVE 08/21/2021 1959   LEUKOCYTESUR TRACE (A) 08/21/2021 1959    Radiological Exams on Admission: CT HEAD WO CONTRAST (5MM)  Result Date: 08/21/2021 CLINICAL DATA:  Neck trauma after a fall. Struck head without loss  of consciousness. Back pain and headache. EXAM: CT HEAD WITHOUT CONTRAST CT CERVICAL SPINE WITHOUT CONTRAST TECHNIQUE: Multidetector CT imaging of the head and cervical spine was performed following the standard protocol without intravenous contrast. Multiplanar CT image reconstructions of the cervical spine were also generated. COMPARISON:  None. FINDINGS: CT HEAD FINDINGS Brain: Diffuse cerebral atrophy. Ventricular dilatation consistent with central atrophy. Low-attenuation changes in the deep white matter consistent with small vessel ischemia. No abnormal extra-axial fluid collections. No mass effect or midline shift. Gray-white matter junctions are distinct. Basal cisterns are not effaced. No acute intracranial hemorrhage. Vascular: Mild intracranial arterial vascular calcifications. Skull: Calvarium appears intact. Sinuses/Orbits: Paranasal sinuses and mastoid air cells are clear. Other: None. CT CERVICAL SPINE FINDINGS Alignment: 4 mm anterior subluxation of C3 on C4. Normal alignment of the facet joints. This could represent degenerative change but ligamentous  injury may be present. Skull base and vertebrae: Skull base appears intact. No vertebral compression deformities. No focal bone lesion or bone destruction. Bone cortex appears intact. Soft tissues and spinal canal: No prevertebral soft tissue swelling. No abnormal paraspinal soft tissue mass or infiltration. Disc levels: Degenerative changes throughout with narrowed disc spaces and endplate osteophyte formation. Degenerative changes in the facet joints. Uncovertebral spurring and facet joint hypertrophy cause encroachment upon the neural foramina bilaterally. Upper chest: Calcification and scarring in the lung apices, greater on the right. Emphysematous changes in the lung apices. Other: None. IMPRESSION: 1. No acute intracranial abnormalities. Chronic atrophy and small vessel ischemic changes. 2. Mild anterior subluxation of C3 on C4, possibly degenerative but ligamentous injury could be present. Consider MRI to assess for ligamentous injury. No acute fractures are identified. Prominent degenerative changes throughout the cervical spine. Electronically Signed   By: Lucienne Capers M.D.   On: 08/21/2021 22:03   CT Angio Chest PE W and/or Wo Contrast  Result Date: 08/21/2021 CLINICAL DATA:  Abdominal distention after a fall. Pulmonary embolus is suspected with high probability. EXAM: CT ANGIOGRAPHY CHEST CT ABDOMEN AND PELVIS WITH CONTRAST TECHNIQUE: Multidetector CT imaging of the chest was performed using the standard protocol during bolus administration of intravenous contrast. Multiplanar CT image reconstructions and MIPs were obtained to evaluate the vascular anatomy. Multidetector CT imaging of the abdomen and pelvis was performed using the standard protocol during bolus administration of intravenous contrast. CONTRAST:  66mL OMNIPAQUE IOHEXOL 350 MG/ML SOLN COMPARISON:  CTA abdomen and pelvis 08/10/2021 FINDINGS: CTA CHEST FINDINGS Cardiovascular: Good opacification of the central and segmental pulmonary  arteries. No focal filling defects. No evidence of significant pulmonary embolus. Normal heart size. No pericardial effusions. Normal caliber thoracic aorta. Scattered aortic calcification. No aortic dissection. Great vessel origins are patent. Mediastinum/Nodes: Thyroid gland is unremarkable. Esophagus is decompressed. No significant lymphadenopathy. Lungs/Pleura: Prominent diffuse emphysematous changes throughout the lungs. Scattered calcified granulomas. Fibrocalcific changes in the lung apices particularly on the right consistent with postinflammatory change. No airspace disease or consolidation. Mild dependent atelectasis. No pleural effusions. No pneumothorax. Musculoskeletal: Compression fractures at T12 and T9 vertebra. Lesions were present on prior thoracic spine radiograph from 06/13/2021 and prior MRI from 06/28/2021. Retropulsion of fracture fragments at T12 is again demonstrated, unchanged. Sternum and ribs are nondisplaced. Review of the MIP images confirms the above findings. CT ABDOMEN and PELVIS FINDINGS Hepatobiliary: No focal liver abnormality is seen. No gallstones, gallbladder wall thickening, or biliary dilatation. Pancreas: Unremarkable. No pancreatic ductal dilatation or surrounding inflammatory changes. Spleen: Normal in size without focal abnormality. Adrenals/Urinary Tract: No adrenal gland nodules. Mild  renal parenchymal atrophy. Nephrograms are symmetrical. 2 mm stone in the lower pole left kidney. No hydronephrosis or hydroureter. Diffuse bladder wall thickening with trabeculation and left posterior bladder wall diverticulum. Changes likely due to chronic outlet obstruction. Stomach/Bowel: Stomach, small bowel, and colon are not abnormally distended. Diverticulosis throughout the colon, most prominent in the sigmoid region. There is mild stranding around the sigmoid colon which may indicate early changes of acute diverticulitis. No abscess. Prominent stool-filled rectum with mild rectal  wall thickening could indicate stercoral colitis. Appendix is not identified. Vascular/Lymphatic: Aortic atherosclerosis. No enlarged abdominal or pelvic lymph nodes. Reproductive: Diffuse prostate enlargement. Other: No free air or free fluid in the abdomen. Abdominal wall musculature appears intact. Musculoskeletal: Degenerative changes in the spine. No destructive bone lesions. Review of the MIP images confirms the above findings. IMPRESSION: 1. No evidence of significant pulmonary embolus. 2. Diffuse emphysematous changes in the lungs. Postinflammatory calcifications. No airspace disease. 3. Diffuse aortic atherosclerosis. 4. Nonobstructing stone in the lower pole left kidney. Bilateral renal parenchymal atrophy. 5. Bladder wall thickening and trabeculation with bladder wall diverticulum. Prostate enlargement. Findings likely represent chronic outlet obstruction. 6. Compression fractures of T9 and T12 vertebral bodies, unchanged since prior studies. 7. Diverticulosis of the sigmoid colon with mild stranding in the pericolonic fat possibly indicating early acute diverticulitis. No abscess. Electronically Signed   By: Lucienne Capers M.D.   On: 08/21/2021 22:18   CT Cervical Spine Wo Contrast  Result Date: 08/21/2021 CLINICAL DATA:  Neck trauma after a fall. Struck head without loss of consciousness. Back pain and headache. EXAM: CT HEAD WITHOUT CONTRAST CT CERVICAL SPINE WITHOUT CONTRAST TECHNIQUE: Multidetector CT imaging of the head and cervical spine was performed following the standard protocol without intravenous contrast. Multiplanar CT image reconstructions of the cervical spine were also generated. COMPARISON:  None. FINDINGS: CT HEAD FINDINGS Brain: Diffuse cerebral atrophy. Ventricular dilatation consistent with central atrophy. Low-attenuation changes in the deep white matter consistent with small vessel ischemia. No abnormal extra-axial fluid collections. No mass effect or midline shift.  Gray-white matter junctions are distinct. Basal cisterns are not effaced. No acute intracranial hemorrhage. Vascular: Mild intracranial arterial vascular calcifications. Skull: Calvarium appears intact. Sinuses/Orbits: Paranasal sinuses and mastoid air cells are clear. Other: None. CT CERVICAL SPINE FINDINGS Alignment: 4 mm anterior subluxation of C3 on C4. Normal alignment of the facet joints. This could represent degenerative change but ligamentous injury may be present. Skull base and vertebrae: Skull base appears intact. No vertebral compression deformities. No focal bone lesion or bone destruction. Bone cortex appears intact. Soft tissues and spinal canal: No prevertebral soft tissue swelling. No abnormal paraspinal soft tissue mass or infiltration. Disc levels: Degenerative changes throughout with narrowed disc spaces and endplate osteophyte formation. Degenerative changes in the facet joints. Uncovertebral spurring and facet joint hypertrophy cause encroachment upon the neural foramina bilaterally. Upper chest: Calcification and scarring in the lung apices, greater on the right. Emphysematous changes in the lung apices. Other: None. IMPRESSION: 1. No acute intracranial abnormalities. Chronic atrophy and small vessel ischemic changes. 2. Mild anterior subluxation of C3 on C4, possibly degenerative but ligamentous injury could be present. Consider MRI to assess for ligamentous injury. No acute fractures are identified. Prominent degenerative changes throughout the cervical spine. Electronically Signed   By: Lucienne Capers M.D.   On: 08/21/2021 22:03   MR Cervical Spine Wo Contrast  Result Date: 08/22/2021 CLINICAL DATA:  Fall EXAM: MRI CERVICAL SPINE WITHOUT CONTRAST TECHNIQUE: Multiplanar, multisequence MR imaging  of the cervical spine was performed. No intravenous contrast was administered. COMPARISON:  None. FINDINGS: Alignment: Grade 1 anterolisthesis at C3-4 Vertebrae: No fracture, evidence of  discitis, or bone lesion. Cord: Normal signal and morphology. Posterior Fossa, vertebral arteries, paraspinal tissues: Negative. Disc levels: Axial images are severely degraded by motion, limiting detailed assessment of the disc spaces. Within that limitation, there is severe left C3-4 neural foraminal stenosis with mild spinal canal stenosis IMPRESSION: 1. Severely motion degraded examination. 2. No acute abnormality of the cervical spine. 3. Mild spinal canal stenosis at C3-4. Severe left C4 neural foraminal stenosis. Electronically Signed   By: Ulyses Jarred M.D.   On: 08/22/2021 01:47   CT ABDOMEN PELVIS W CONTRAST  Result Date: 08/21/2021 CLINICAL DATA:  Abdominal distention after a fall. Pulmonary embolus is suspected with high probability. EXAM: CT ANGIOGRAPHY CHEST CT ABDOMEN AND PELVIS WITH CONTRAST TECHNIQUE: Multidetector CT imaging of the chest was performed using the standard protocol during bolus administration of intravenous contrast. Multiplanar CT image reconstructions and MIPs were obtained to evaluate the vascular anatomy. Multidetector CT imaging of the abdomen and pelvis was performed using the standard protocol during bolus administration of intravenous contrast. CONTRAST:  21mL OMNIPAQUE IOHEXOL 350 MG/ML SOLN COMPARISON:  CTA abdomen and pelvis 08/10/2021 FINDINGS: CTA CHEST FINDINGS Cardiovascular: Good opacification of the central and segmental pulmonary arteries. No focal filling defects. No evidence of significant pulmonary embolus. Normal heart size. No pericardial effusions. Normal caliber thoracic aorta. Scattered aortic calcification. No aortic dissection. Great vessel origins are patent. Mediastinum/Nodes: Thyroid gland is unremarkable. Esophagus is decompressed. No significant lymphadenopathy. Lungs/Pleura: Prominent diffuse emphysematous changes throughout the lungs. Scattered calcified granulomas. Fibrocalcific changes in the lung apices particularly on the right consistent  with postinflammatory change. No airspace disease or consolidation. Mild dependent atelectasis. No pleural effusions. No pneumothorax. Musculoskeletal: Compression fractures at T12 and T9 vertebra. Lesions were present on prior thoracic spine radiograph from 06/13/2021 and prior MRI from 06/28/2021. Retropulsion of fracture fragments at T12 is again demonstrated, unchanged. Sternum and ribs are nondisplaced. Review of the MIP images confirms the above findings. CT ABDOMEN and PELVIS FINDINGS Hepatobiliary: No focal liver abnormality is seen. No gallstones, gallbladder wall thickening, or biliary dilatation. Pancreas: Unremarkable. No pancreatic ductal dilatation or surrounding inflammatory changes. Spleen: Normal in size without focal abnormality. Adrenals/Urinary Tract: No adrenal gland nodules. Mild renal parenchymal atrophy. Nephrograms are symmetrical. 2 mm stone in the lower pole left kidney. No hydronephrosis or hydroureter. Diffuse bladder wall thickening with trabeculation and left posterior bladder wall diverticulum. Changes likely due to chronic outlet obstruction. Stomach/Bowel: Stomach, small bowel, and colon are not abnormally distended. Diverticulosis throughout the colon, most prominent in the sigmoid region. There is mild stranding around the sigmoid colon which may indicate early changes of acute diverticulitis. No abscess. Prominent stool-filled rectum with mild rectal wall thickening could indicate stercoral colitis. Appendix is not identified. Vascular/Lymphatic: Aortic atherosclerosis. No enlarged abdominal or pelvic lymph nodes. Reproductive: Diffuse prostate enlargement. Other: No free air or free fluid in the abdomen. Abdominal wall musculature appears intact. Musculoskeletal: Degenerative changes in the spine. No destructive bone lesions. Review of the MIP images confirms the above findings. IMPRESSION: 1. No evidence of significant pulmonary embolus. 2. Diffuse emphysematous changes in the  lungs. Postinflammatory calcifications. No airspace disease. 3. Diffuse aortic atherosclerosis. 4. Nonobstructing stone in the lower pole left kidney. Bilateral renal parenchymal atrophy. 5. Bladder wall thickening and trabeculation with bladder wall diverticulum. Prostate enlargement. Findings likely represent chronic outlet  obstruction. 6. Compression fractures of T9 and T12 vertebral bodies, unchanged since prior studies. 7. Diverticulosis of the sigmoid colon with mild stranding in the pericolonic fat possibly indicating early acute diverticulitis. No abscess. Electronically Signed   By: Lucienne Capers M.D.   On: 08/21/2021 22:18   CT T-SPINE NO CHARGE  Result Date: 08/21/2021 CLINICAL DATA:  Fall EXAM: CT THORACIC SPINE WITHOUT CONTRAST TECHNIQUE: Multidetector CT images of the thoracic were obtained using the standard protocol without intravenous contrast. COMPARISON:  CT abdomen pelvis 08/10/2021 Thoracic spine radiographs 06/13/2021 FINDINGS: Alignment: Normal. Vertebrae: Compression fracture at T12 with greater than 50% height loss and 9 mm of retropulsion is unchanged. There is also an old compression fracture of T9 with less than 25% height loss and no retropulsion. Paraspinal and other soft tissues: Emphysema and calcific atherosclerosis of the aorta. Disc levels: There is moderate spinal canal narrowing at the T12 level due to the retropulsed fragment. Otherwise, no spinal canal stenosis. IMPRESSION: 1. Unchanged appearance of T12 compression fracture with greater than 50% height loss and 9 mm of retropulsion resulting in moderate spinal canal narrowing. 2. Old compression fracture of T9 with less than 25% height loss and no retropulsion. Aortic Atherosclerosis (ICD10-I70.0). Electronically Signed   By: Ulyses Jarred M.D.   On: 08/21/2021 22:06   CT L-SPINE NO CHARGE  Result Date: 08/21/2021 CLINICAL DATA:  Fall EXAM: CT LUMBAR SPINE WITHOUT CONTRAST TECHNIQUE: Multidetector CT imaging of  the lumbar spine was performed without intravenous contrast administration. Multiplanar CT image reconstructions were also generated. COMPARISON:  None. FINDINGS: Segmentation: 5 lumbar type vertebrae. Alignment: Normal. Vertebrae: No acute fracture or focal pathologic process. Paraspinal and other soft tissues: Calcific aortic atherosclerosis Disc levels: Multilevel degenerative disc disease and facet arthrosis. No spinal canal stenosis. No neural impingement. IMPRESSION: 1. No acute fracture or static subluxation of the lumbar spine. 2. Multilevel degenerative disc disease and facet arthrosis without spinal canal stenosis or neural impingement. 3. T12 compression fracture described on concomitant thoracic spine CT. Aortic Atherosclerosis (ICD10-I70.0). Electronically Signed   By: Ulyses Jarred M.D.   On: 08/21/2021 22:14      Assessment/Plan  Hypotension - BP of 90s over 50s.  Likely due to hypovolemia.  Keep on IV continuous fluid  Sepsis secondary acute diverticulitis -Patient with tachypnea and leukocytosis with findings of mild early acute diverticulitis on CT abdomen - Continue IV Zosyn - Full liquid diet for bowel rest -Continue IV fluid infusion  AKI - Creatinine elevated to 1.23 from prior 0.86 - Patient has findings of chronic bladder obstruction on CT but suspect this is more prerenal due to hypovolemia especially with findings of hypotension - Avoid nephrotoxic agent.  Monitor on continuous IV fluid  Fall - Likely secondary to hypotension and infection -Placed on fall precaution  Hypokalemia - Potassium 3.3.  Replete with oral K  Cervical spinal canal stenosis - CT cervical neck had concerns of C3 on C4 subluxation with possible ligamentous injury.  MRI was obtained which showed no acute abnormality.  Mild spinal canal stenosis C3-4.  Severe left C4 neuroforaminal stenosis. -okay to remove C-collar   BPH - Continue tamsulosin  DVT prophylaxis:.Lovenox Code Status:  Full Family Communication: no family at bedside  disposition Plan: Home with at least 2 midnight stays  Consults called:  Admission status: inpatient  Level of care: Progressive Cardiac  Status is: Inpatient  Remains inpatient appropriate because:Inpatient level of care appropriate due to severity of illness  Dispo: The patient is  from: Home              Anticipated d/c is to: Home              Patient currently is not medically stable to d/c.   Difficult to place patient No         Orene Desanctis DO [facility]   If 7PM-7AM, please contact night-coverage [email or web address]   08/22/2021, 2:13 AM

## 2021-08-22 NOTE — ED Notes (Signed)
Patient return from MRI.

## 2021-08-22 NOTE — Progress Notes (Signed)
PROGRESS NOTE    Jason Powers  BWG:[id number] DOB: March 09, 1921 DOA: 08/21/2021 PCP: Crecencio Mc, MD    Brief Narrative:  Jason Powers is a 87 y.o. male with medical history significant for COPD, hypertension, iron deficiency anemia, BPH, history of thoracic compression fracture who presents with concerns of fall.  Patient had a CT and MRI of the neck, does not have any fracture.  CT abdomen/pelvis suggest early diverticulitis without abscess.  Patient has significant leukocytosis, tachycardia and, acute kidney injury.  Met sepsis criteria.  He is treated with Zosyn.   Assessment & Plan:   Principal Problem:   Hypotension Active Problems:   Diverticulitis   AKI (acute kidney injury) ([place])   Hypokalemia   Cervical stenosis of spinal canal   Sepsis ([facility])  Severe sepsis.  Secondary to diverticulitis. Acute diverticulitis. Acute kidney injury secondary to severe sepsis. Patient met severe sepsis criteria with significant tachycardia, leukocytosis and acute kidney injury in addition to hypotension. He received IV fluids, blood pressure is better. Continue Zosyn.  Mechanical fall.   Cervical spinal stenosis. No fracture in cervical spine.  Hypokalemia. Repleted again today.  Urinary retention secondary to benign prostate hypertrophy Patient has severely distended bladder, bladder scan showed more than 600 mL of residual.  Patient could not urinate. Asking nurse to place a Foley catheter.  However, patient adamantly refused it.  He states that he is 87 years of age, if his time to die he would die.  But he does not want a Foley catheter.  I explained to him that it can be very bad if he continues to have urinary tension and he will suffer from it.  He does not agree to it.  I have called the patient daughter, who will try to convince him to place a Foley catheter.  CODE STATUS. Discussed with patient and also with patient daughter, confirm patient DO NOT RESUSCITATE  status.  Patient now is requesting sign out [place].  At this point, even at this age, patient is seem to understand the consequences of his action.  He does not want any treatment.  At this point, his daughter will try to convince him to stay.  If was unsuccessful, patient will be allowed to sign out.   DVT prophylaxis: Lovenox Code Status: DNR Family Communication: Updated daughter as above. Disposition Plan:    Status is: Inpatient  Remains inpatient appropriate because:IV treatments appropriate due to intensity of illness or inability to take PO and Inpatient level of care appropriate due to severity of illness  Dispo: The patient is from: Home              Anticipated d/c is to: Home              Patient currently is not medically stable to d/c.   Difficult to place patient No        I/O last 3 completed shifts: In: 2050 [IV Piggyback:2050] Out: 200 [Urine:200] No intake/output data recorded.     Consultants:  None  Procedures: None  Antimicrobials: Zosyn.   Subjective: Patient states that he does not feel better.  His blood pressure was better after fluids.  His bladder is distended, but he refused to place Foley catheter He had a bowel movement while trying to urinate.  But could not urinate. Denies any short of breath or cough. No fever or chills. No chest pain or palpitation.  Objective: Vitals:   08/22/21 0600 08/22/21 0700 08/22/21 0930 08/22/21  1030  BP: (!) 105/53 127/73 127/68 122/65  Pulse: 90 83 91 78  Resp: 17 (!) 21 (!) 28 (!) 26  Temp:      TempSrc:      SpO2: 96% 93% 91% 90%  Weight:      Height:        Intake/Output Summary (Last 24 hours) at 08/22/2021 1302 Last data filed at 08/22/2021 0045 Gross per 24 hour  Intake 2050 ml  Output 200 ml  Net 1850 ml   Filed Weights   08/21/21 1953  Weight: 59 kg    Examination:  General exam: Frail, no distress. Respiratory system: Clear to auscultation. Respiratory  effort normal. Cardiovascular system: S1 & S2 heard, RRR. No JVD, murmurs, rubs, gallops or clicks. No pedal edema. Gastrointestinal system: Abdomen is distended, soft with distended bladder. Central nervous system: Alert and oriented x2. No focal neurological deficits. Extremities: Symmetric 5 x 5 power. Skin: No rashes, lesions or ulcers Psychiatry: Judgement and insight appear normal. Mood & affect appropriate.  Patient understand the consequences of no treatment.    Data Reviewed: I have personally reviewed following labs and imaging studies  CBC: Recent Labs  Lab 08/21/21 1959 08/22/21 0641  WBC 29.5* 26.2*  NEUTROABS 27.4*  --   HGB 10.6* 10.8*  HCT 31.7* 33.8*  MCV 105.3* 104.3*  PLT 278 517   Basic Metabolic Panel: Recent Labs  Lab 08/21/21 1959 08/22/21 0641  NA 133* 137  K 3.3* 3.2*  CL 93* 100  CO2 27 27  GLUCOSE 188* 148*  BUN 31* 29*  CREATININE 1.23 1.11  CALCIUM 8.5* 8.4*   GFR: Estimated Creatinine Clearance: 31 mL/min (by C-G formula based on SCr of 1.11 mg/dL). Liver Function Tests: Recent Labs  Lab 08/21/21 1959  AST 26  ALT 19  ALKPHOS 45  BILITOT 1.6*  PROT 6.3*  ALBUMIN 3.6   Recent Labs  Lab 08/21/21 1959  LIPASE 37   No results for input(s): AMMONIA in the last 168 hours. Coagulation Profile: No results for input(s): INR, PROTIME in the last 168 hours. Cardiac Enzymes: No results for input(s): CKTOTAL, CKMB, CKMBINDEX, TROPONINI in the last 168 hours. BNP (last 3 results) No results for input(s): PROBNP in the last 8760 hours. HbA1C: No results for input(s): HGBA1C in the last 72 hours. CBG: No results for input(s): GLUCAP in the last 168 hours. Lipid Profile: No results for input(s): CHOL, HDL, LDLCALC, TRIG, CHOLHDL, LDLDIRECT in the last 72 hours. Thyroid Function Tests: No results for input(s): TSH, T4TOTAL, FREET4, T3FREE, THYROIDAB in the last 72 hours. Anemia Panel: No results for input(s): VITAMINB12, FOLATE,  FERRITIN, TIBC, IRON, RETICCTPCT in the last 72 hours. Sepsis Labs: Recent Labs  Lab 08/21/21 2233 08/22/21 0641  LATICACIDVEN 1.4 1.3    Recent Results (from the past 240 hour(s))  Resp Panel by RT-PCR (Flu A&B, Covid) Nasopharyngeal Swab     Status: None   Collection Time: 08/21/21  7:59 PM   Specimen: Nasopharyngeal Swab; Nasopharyngeal(NP) swabs in vial transport medium  Result Value Ref Range Status   SARS Coronavirus 2 by RT PCR NEGATIVE NEGATIVE Final    Comment: (NOTE) SARS-CoV-2 target nucleic acids are NOT DETECTED.  The SARS-CoV-2 RNA is generally detectable in upper respiratory specimens during the acute phase of infection. The lowest concentration of SARS-CoV-2 viral copies this assay can detect is 138 copies/mL. A negative result does not preclude SARS-Cov-2 infection and should not be used as the sole basis for treatment  or other patient management decisions. A negative result may occur with  improper specimen collection/handling, submission of specimen other than nasopharyngeal swab, presence of viral mutation(s) within the areas targeted by this assay, and inadequate number of viral copies(<138 copies/mL). A negative result must be combined with clinical observations, patient history, and epidemiological information. The expected result is Negative.  Fact Sheet for Patients:  EntrepreneurPulse.com.au  Fact Sheet for Healthcare Providers:  IncredibleEmployment.be  This test is no t yet approved or cleared by the [place] FDA and  has been authorized for detection and/or diagnosis of SARS-CoV-2 by FDA under an Emergency Use Authorization (EUA). This EUA will remain  in effect (meaning this test can be used) for the duration of the COVID-19 declaration under Section 564(b)(1) of the Act, 21 U.S.C.section 360bbb-3(b)(1), unless the authorization is terminated  or revoked sooner.       Influenza A by PCR NEGATIVE  NEGATIVE Final   Influenza B by PCR NEGATIVE NEGATIVE Final    Comment: (NOTE) The Xpert Xpress SARS-CoV-2/FLU/RSV plus assay is intended as an aid in the diagnosis of influenza from Nasopharyngeal swab specimens and should not be used as a sole basis for treatment. Nasal washings and aspirates are unacceptable for Xpert Xpress SARS-CoV-2/FLU/RSV testing.  Fact Sheet for Patients: EntrepreneurPulse.com.au  Fact Sheet for Healthcare Providers: IncredibleEmployment.be  This test is not yet approved or cleared by the [place] FDA and has been authorized for detection and/or diagnosis of SARS-CoV-2 by FDA under an Emergency Use Authorization (EUA). This EUA will remain in effect (meaning this test can be used) for the duration of the COVID-19 declaration under Section 564(b)(1) of the Act, 21 U.S.C. section 360bbb-3(b)(1), unless the authorization is terminated or revoked.  Performed at [facility], [address] [place], [place] [address]   Blood culture (routine x 2)     Status: None (Preliminary result)   Collection Time: 08/21/21 10:34 PM   Specimen: BLOOD  Result Value Ref Range Status   Specimen Description BLOOD LEFT ASSIST CONTROL  Final   Special Requests   Final    BOTTLES DRAWN AEROBIC AND ANAEROBIC Blood Culture adequate volume   Culture   Final    NO GROWTH < 12 HOURS Performed at [facility], [address] [place], [place] [address]    Report Status PENDING  Incomplete  Blood culture (routine x 2)     Status: None (Preliminary result)   Collection Time: 08/21/21 10:50 PM   Specimen: BLOOD  Result Value Ref Range Status   Specimen Description BLOOD RIGHT FOREARM  Final   Special Requests   Final    BOTTLES DRAWN AEROBIC AND ANAEROBIC Blood Culture results may not be optimal due to an inadequate volume of blood received in culture bottles   Culture   Final    NO GROWTH < 12 HOURS Performed at  [facility], [address] [place], [place] [address]    Report Status PENDING  Incomplete         Radiology Studies: CT HEAD WO CONTRAST (5MM)  Result Date: 08/21/2021 CLINICAL DATA:  Neck trauma after a fall. Struck head without loss of consciousness. Back pain and headache. EXAM: CT HEAD WITHOUT CONTRAST CT CERVICAL SPINE WITHOUT CONTRAST TECHNIQUE: Multidetector CT imaging of the head and cervical spine was performed following the standard protocol without intravenous contrast. Multiplanar CT image reconstructions of the cervical spine were also generated. COMPARISON:  None. FINDINGS: CT HEAD FINDINGS Brain: Diffuse cerebral  atrophy. Ventricular dilatation consistent with central atrophy. Low-attenuation changes in the deep white matter consistent with small vessel ischemia. No abnormal extra-axial fluid collections. No mass effect or midline shift. Gray-white matter junctions are distinct. Basal cisterns are not effaced. No acute intracranial hemorrhage. Vascular: Mild intracranial arterial vascular calcifications. Skull: Calvarium appears intact. Sinuses/Orbits: Paranasal sinuses and mastoid air cells are clear. Other: None. CT CERVICAL SPINE FINDINGS Alignment: 4 mm anterior subluxation of C3 on C4. Normal alignment of the facet joints. This could represent degenerative change but ligamentous injury may be present. Skull base and vertebrae: Skull base appears intact. No vertebral compression deformities. No focal bone lesion or bone destruction. Bone cortex appears intact. Soft tissues and spinal canal: No prevertebral soft tissue swelling. No abnormal paraspinal soft tissue mass or infiltration. Disc levels: Degenerative changes throughout with narrowed disc spaces and endplate osteophyte formation. Degenerative changes in the facet joints. Uncovertebral spurring and facet joint hypertrophy cause encroachment upon the neural foramina bilaterally. Upper chest: Calcification and  scarring in the lung apices, greater on the right. Emphysematous changes in the lung apices. Other: None. IMPRESSION: 1. No acute intracranial abnormalities. Chronic atrophy and small vessel ischemic changes. 2. Mild anterior subluxation of C3 on C4, possibly degenerative but ligamentous injury could be present. Consider MRI to assess for ligamentous injury. No acute fractures are identified. Prominent degenerative changes throughout the cervical spine. Electronically Signed   By: Lucienne Capers M.D.   On: 08/21/2021 22:03   CT Angio Chest PE W and/or Wo Contrast  Result Date: 08/21/2021 CLINICAL DATA:  Abdominal distention after a fall. Pulmonary embolus is suspected with high probability. EXAM: CT ANGIOGRAPHY CHEST CT ABDOMEN AND PELVIS WITH CONTRAST TECHNIQUE: Multidetector CT imaging of the chest was performed using the standard protocol during bolus administration of intravenous contrast. Multiplanar CT image reconstructions and MIPs were obtained to evaluate the vascular anatomy. Multidetector CT imaging of the abdomen and pelvis was performed using the standard protocol during bolus administration of intravenous contrast. CONTRAST:  53m OMNIPAQUE IOHEXOL 350 MG/ML SOLN COMPARISON:  CTA abdomen and pelvis 08/10/2021 FINDINGS: CTA CHEST FINDINGS Cardiovascular: Good opacification of the central and segmental pulmonary arteries. No focal filling defects. No evidence of significant pulmonary embolus. Normal heart size. No pericardial effusions. Normal caliber thoracic aorta. Scattered aortic calcification. No aortic dissection. Great vessel origins are patent. Mediastinum/Nodes: Thyroid gland is unremarkable. Esophagus is decompressed. No significant lymphadenopathy. Lungs/Pleura: Prominent diffuse emphysematous changes throughout the lungs. Scattered calcified granulomas. Fibrocalcific changes in the lung apices particularly on the right consistent with postinflammatory change. No airspace disease or  consolidation. Mild dependent atelectasis. No pleural effusions. No pneumothorax. Musculoskeletal: Compression fractures at T12 and T9 vertebra. Lesions were present on prior thoracic spine radiograph from 06/13/2021 and prior MRI from 06/28/2021. Retropulsion of fracture fragments at T12 is again demonstrated, unchanged. Sternum and ribs are nondisplaced. Review of the MIP images confirms the above findings. CT ABDOMEN and PELVIS FINDINGS Hepatobiliary: No focal liver abnormality is seen. No gallstones, gallbladder wall thickening, or biliary dilatation. Pancreas: Unremarkable. No pancreatic ductal dilatation or surrounding inflammatory changes. Spleen: Normal in size without focal abnormality. Adrenals/Urinary Tract: No adrenal gland nodules. Mild renal parenchymal atrophy. Nephrograms are symmetrical. 2 mm stone in the lower pole left kidney. No hydronephrosis or hydroureter. Diffuse bladder wall thickening with trabeculation and left posterior bladder wall diverticulum. Changes likely due to chronic outlet obstruction. Stomach/Bowel: Stomach, small bowel, and colon are not abnormally distended. Diverticulosis throughout the colon, most prominent in the  sigmoid region. There is mild stranding around the sigmoid colon which may indicate early changes of acute diverticulitis. No abscess. Prominent stool-filled rectum with mild rectal wall thickening could indicate stercoral colitis. Appendix is not identified. Vascular/Lymphatic: Aortic atherosclerosis. No enlarged abdominal or pelvic lymph nodes. Reproductive: Diffuse prostate enlargement. Other: No free air or free fluid in the abdomen. Abdominal wall musculature appears intact. Musculoskeletal: Degenerative changes in the spine. No destructive bone lesions. Review of the MIP images confirms the above findings. IMPRESSION: 1. No evidence of significant pulmonary embolus. 2. Diffuse emphysematous changes in the lungs. Postinflammatory calcifications. No airspace  disease. 3. Diffuse aortic atherosclerosis. 4. Nonobstructing stone in the lower pole left kidney. Bilateral renal parenchymal atrophy. 5. Bladder wall thickening and trabeculation with bladder wall diverticulum. Prostate enlargement. Findings likely represent chronic outlet obstruction. 6. Compression fractures of T9 and T12 vertebral bodies, unchanged since prior studies. 7. Diverticulosis of the sigmoid colon with mild stranding in the pericolonic fat possibly indicating early acute diverticulitis. No abscess. Electronically Signed   By: Lucienne Capers M.D.   On: 08/21/2021 22:18   CT Cervical Spine Wo Contrast  Result Date: 08/21/2021 CLINICAL DATA:  Neck trauma after a fall. Struck head without loss of consciousness. Back pain and headache. EXAM: CT HEAD WITHOUT CONTRAST CT CERVICAL SPINE WITHOUT CONTRAST TECHNIQUE: Multidetector CT imaging of the head and cervical spine was performed following the standard protocol without intravenous contrast. Multiplanar CT image reconstructions of the cervical spine were also generated. COMPARISON:  None. FINDINGS: CT HEAD FINDINGS Brain: Diffuse cerebral atrophy. Ventricular dilatation consistent with central atrophy. Low-attenuation changes in the deep white matter consistent with small vessel ischemia. No abnormal extra-axial fluid collections. No mass effect or midline shift. Gray-white matter junctions are distinct. Basal cisterns are not effaced. No acute intracranial hemorrhage. Vascular: Mild intracranial arterial vascular calcifications. Skull: Calvarium appears intact. Sinuses/Orbits: Paranasal sinuses and mastoid air cells are clear. Other: None. CT CERVICAL SPINE FINDINGS Alignment: 4 mm anterior subluxation of C3 on C4. Normal alignment of the facet joints. This could represent degenerative change but ligamentous injury may be present. Skull base and vertebrae: Skull base appears intact. No vertebral compression deformities. No focal bone lesion or bone  destruction. Bone cortex appears intact. Soft tissues and spinal canal: No prevertebral soft tissue swelling. No abnormal paraspinal soft tissue mass or infiltration. Disc levels: Degenerative changes throughout with narrowed disc spaces and endplate osteophyte formation. Degenerative changes in the facet joints. Uncovertebral spurring and facet joint hypertrophy cause encroachment upon the neural foramina bilaterally. Upper chest: Calcification and scarring in the lung apices, greater on the right. Emphysematous changes in the lung apices. Other: None. IMPRESSION: 1. No acute intracranial abnormalities. Chronic atrophy and small vessel ischemic changes. 2. Mild anterior subluxation of C3 on C4, possibly degenerative but ligamentous injury could be present. Consider MRI to assess for ligamentous injury. No acute fractures are identified. Prominent degenerative changes throughout the cervical spine. Electronically Signed   By: Lucienne Capers M.D.   On: 08/21/2021 22:03   MR Cervical Spine Wo Contrast  Result Date: 08/22/2021 CLINICAL DATA:  Fall EXAM: MRI CERVICAL SPINE WITHOUT CONTRAST TECHNIQUE: Multiplanar, multisequence MR imaging of the cervical spine was performed. No intravenous contrast was administered. COMPARISON:  None. FINDINGS: Alignment: Grade 1 anterolisthesis at C3-4 Vertebrae: No fracture, evidence of discitis, or bone lesion. Cord: Normal signal and morphology. Posterior Fossa, vertebral arteries, paraspinal tissues: Negative. Disc levels: Axial images are severely degraded by motion, limiting detailed assessment of the  disc spaces. Within that limitation, there is severe left C3-4 neural foraminal stenosis with mild spinal canal stenosis IMPRESSION: 1. Severely motion degraded examination. 2. No acute abnormality of the cervical spine. 3. Mild spinal canal stenosis at C3-4. Severe left C4 neural foraminal stenosis. Electronically Signed   By: Ulyses Jarred M.D.   On: 08/22/2021 01:47   CT  ABDOMEN PELVIS W CONTRAST  Result Date: 08/21/2021 CLINICAL DATA:  Abdominal distention after a fall. Pulmonary embolus is suspected with high probability. EXAM: CT ANGIOGRAPHY CHEST CT ABDOMEN AND PELVIS WITH CONTRAST TECHNIQUE: Multidetector CT imaging of the chest was performed using the standard protocol during bolus administration of intravenous contrast. Multiplanar CT image reconstructions and MIPs were obtained to evaluate the vascular anatomy. Multidetector CT imaging of the abdomen and pelvis was performed using the standard protocol during bolus administration of intravenous contrast. CONTRAST:  22m OMNIPAQUE IOHEXOL 350 MG/ML SOLN COMPARISON:  CTA abdomen and pelvis 08/10/2021 FINDINGS: CTA CHEST FINDINGS Cardiovascular: Good opacification of the central and segmental pulmonary arteries. No focal filling defects. No evidence of significant pulmonary embolus. Normal heart size. No pericardial effusions. Normal caliber thoracic aorta. Scattered aortic calcification. No aortic dissection. Great vessel origins are patent. Mediastinum/Nodes: Thyroid gland is unremarkable. Esophagus is decompressed. No significant lymphadenopathy. Lungs/Pleura: Prominent diffuse emphysematous changes throughout the lungs. Scattered calcified granulomas. Fibrocalcific changes in the lung apices particularly on the right consistent with postinflammatory change. No airspace disease or consolidation. Mild dependent atelectasis. No pleural effusions. No pneumothorax. Musculoskeletal: Compression fractures at T12 and T9 vertebra. Lesions were present on prior thoracic spine radiograph from 06/13/2021 and prior MRI from 06/28/2021. Retropulsion of fracture fragments at T12 is again demonstrated, unchanged. Sternum and ribs are nondisplaced. Review of the MIP images confirms the above findings. CT ABDOMEN and PELVIS FINDINGS Hepatobiliary: No focal liver abnormality is seen. No gallstones, gallbladder wall thickening, or biliary  dilatation. Pancreas: Unremarkable. No pancreatic ductal dilatation or surrounding inflammatory changes. Spleen: Normal in size without focal abnormality. Adrenals/Urinary Tract: No adrenal gland nodules. Mild renal parenchymal atrophy. Nephrograms are symmetrical. 2 mm stone in the lower pole left kidney. No hydronephrosis or hydroureter. Diffuse bladder wall thickening with trabeculation and left posterior bladder wall diverticulum. Changes likely due to chronic outlet obstruction. Stomach/Bowel: Stomach, small bowel, and colon are not abnormally distended. Diverticulosis throughout the colon, most prominent in the sigmoid region. There is mild stranding around the sigmoid colon which may indicate early changes of acute diverticulitis. No abscess. Prominent stool-filled rectum with mild rectal wall thickening could indicate stercoral colitis. Appendix is not identified. Vascular/Lymphatic: Aortic atherosclerosis. No enlarged abdominal or pelvic lymph nodes. Reproductive: Diffuse prostate enlargement. Other: No free air or free fluid in the abdomen. Abdominal wall musculature appears intact. Musculoskeletal: Degenerative changes in the spine. No destructive bone lesions. Review of the MIP images confirms the above findings. IMPRESSION: 1. No evidence of significant pulmonary embolus. 2. Diffuse emphysematous changes in the lungs. Postinflammatory calcifications. No airspace disease. 3. Diffuse aortic atherosclerosis. 4. Nonobstructing stone in the lower pole left kidney. Bilateral renal parenchymal atrophy. 5. Bladder wall thickening and trabeculation with bladder wall diverticulum. Prostate enlargement. Findings likely represent chronic outlet obstruction. 6. Compression fractures of T9 and T12 vertebral bodies, unchanged since prior studies. 7. Diverticulosis of the sigmoid colon with mild stranding in the pericolonic fat possibly indicating early acute diverticulitis. No abscess. Electronically Signed   By:  WLucienne CapersM.D.   On: 08/21/2021 22:18   CT T-SPINE NO CHARGE  Result  Date: 08/21/2021 CLINICAL DATA:  Fall EXAM: CT THORACIC SPINE WITHOUT CONTRAST TECHNIQUE: Multidetector CT images of the thoracic were obtained using the standard protocol without intravenous contrast. COMPARISON:  CT abdomen pelvis 08/10/2021 Thoracic spine radiographs 06/13/2021 FINDINGS: Alignment: Normal. Vertebrae: Compression fracture at T12 with greater than 50% height loss and 9 mm of retropulsion is unchanged. There is also an old compression fracture of T9 with less than 25% height loss and no retropulsion. Paraspinal and other soft tissues: Emphysema and calcific atherosclerosis of the aorta. Disc levels: There is moderate spinal canal narrowing at the T12 level due to the retropulsed fragment. Otherwise, no spinal canal stenosis. IMPRESSION: 1. Unchanged appearance of T12 compression fracture with greater than 50% height loss and 9 mm of retropulsion resulting in moderate spinal canal narrowing. 2. Old compression fracture of T9 with less than 25% height loss and no retropulsion. Aortic Atherosclerosis (ICD10-I70.0). Electronically Signed   By: Ulyses Jarred M.D.   On: 08/21/2021 22:06   CT L-SPINE NO CHARGE  Result Date: 08/21/2021 CLINICAL DATA:  Fall EXAM: CT LUMBAR SPINE WITHOUT CONTRAST TECHNIQUE: Multidetector CT imaging of the lumbar spine was performed without intravenous contrast administration. Multiplanar CT image reconstructions were also generated. COMPARISON:  None. FINDINGS: Segmentation: 5 lumbar type vertebrae. Alignment: Normal. Vertebrae: No acute fracture or focal pathologic process. Paraspinal and other soft tissues: Calcific aortic atherosclerosis Disc levels: Multilevel degenerative disc disease and facet arthrosis. No spinal canal stenosis. No neural impingement. IMPRESSION: 1. No acute fracture or static subluxation of the lumbar spine. 2. Multilevel degenerative disc disease and facet arthrosis  without spinal canal stenosis or neural impingement. 3. T12 compression fracture described on concomitant thoracic spine CT. Aortic Atherosclerosis (ICD10-I70.0). Electronically Signed   By: Ulyses Jarred M.D.   On: 08/21/2021 22:14        Scheduled Meds:  [START ON 08/23/2021] enoxaparin (LOVENOX) injection  40 mg Subcutaneous Q24H   Ferrous Fumarate  1 tablet Oral Q breakfast   potassium chloride  40 mEq Oral BID   tamsulosin  0.4 mg Oral Daily   Continuous Infusions:  piperacillin-tazobactam (ZOSYN)  IV Stopped (08/22/21 1134)     LOS: 1 day    Time spent: 36 minutes    Sharen Hones, MD [facility]   To contact the attending provider between 7A-7P or the covering provider during after hours 7P-7A, please log into the web site [email or web address] and access using universal [place] password for that web site. If you do not have the password, please call the hospital operator.  08/22/2021, 1:02 PM

## 2021-08-22 NOTE — ED Notes (Signed)
RN completed bladder scan on pt as pt abd is distended. RN got multiple readings that was greater than 628ml in bladder. Per MD put in foley cath. Pt refuses the need for one. Pt is A/Ox4 and has mental capacity. Pt sts that his wife died back in 02-24-2004 and does not want to continue to suffer on this earth without her. Pt continues to sts that she passed away on their wedding anniversary and its been a rough life since than. Pt sts that he has to die from something and if its his time to go than he is ready.

## 2021-08-22 NOTE — ED Notes (Signed)
[place] collar placed on patient

## 2021-08-22 NOTE — ED Notes (Signed)
Called patient's daughters, [name] and [name], and provided an update on pt's disposition.

## 2021-08-22 NOTE — ED Notes (Signed)
RN cleaned up pt from BM.

## 2021-08-22 NOTE — Progress Notes (Signed)
Anticoagulation monitoring(Lovenox):  87 yo male ordered Lovenox 40 mg Q24h    Filed Weights   08/21/21 1953  Weight: 59 kg (130 lb)   BMI 19.77    Lab Results  Component Value Date   CREATININE 1.23 08/21/2021   CREATININE 0.86 08/11/2021   CREATININE 1.12 08/10/2021   Estimated Creatinine Clearance: 28 mL/min (by C-G formula based on SCr of 1.23 mg/dL). Hemoglobin & Hematocrit     Component Value Date/Time   HGB 10.6 (L) 08/21/2021 1959   HCT 31.7 (L) 08/21/2021 1959     Per Protocol for Patient with estCrcl < 30 ml/min and BMI < 40, will transition to Lovenox 30 mg Q24h.

## 2021-08-22 NOTE — Progress Notes (Addendum)
Pt was admitted in the floor with no sign of distress pt alert to self and time only. VSS. Unable to complete screening assessment. Will notify incoming shift. Pt was educated about safety and ascom within pt reach. Will continue to monitor.  Update 2105: pt is compalining of 5/10 neck pain but no PRN on MAR. NP Morrsion made aware. Will continue to monitor.  Update 2109: NP Placed order. Will continue to monitor.

## 2021-08-22 NOTE — ED Notes (Signed)
RN cleaned pt up of BM. Pt transported to floor at this time.

## 2021-08-22 NOTE — ED Notes (Signed)
RN cleaned pt up of BM.

## 2021-08-22 NOTE — ED Notes (Signed)
C collar removed

## 2021-08-22 NOTE — ED Notes (Signed)
Patient had another small BM, changed brief and cleaned pt up.

## 2021-08-22 NOTE — ED Notes (Signed)
Patient remains in MRI 

## 2021-08-22 NOTE — ED Notes (Signed)
Patient had another very small bowel movement. Cleaned patient up and brief changed. No other needs at this time

## 2021-08-22 NOTE — Plan of Care (Signed)
  Problem: Clinical Measurements: Goal: Will remain free from infection Outcome: Progressing   Problem: Pain Managment: Goal: General experience of comfort will improve Outcome: Progressing   Problem: Safety: Goal: Ability to remain free from injury will improve Outcome: Progressing   

## 2021-08-22 NOTE — ED Notes (Signed)
Patient had a small BM. Pt cleaned up and fresh pads and brief placed on pt

## 2021-08-23 ENCOUNTER — Encounter: Payer: Self-pay | Admitting: Family Medicine

## 2021-08-23 DIAGNOSIS — K5792 Diverticulitis of intestine, part unspecified, without perforation or abscess without bleeding: Secondary | ICD-10-CM | POA: Diagnosis not present

## 2021-08-23 DIAGNOSIS — E876 Hypokalemia: Secondary | ICD-10-CM | POA: Diagnosis not present

## 2021-08-23 DIAGNOSIS — I9589 Other hypotension: Secondary | ICD-10-CM | POA: Diagnosis not present

## 2021-08-23 DIAGNOSIS — A419 Sepsis, unspecified organism: Secondary | ICD-10-CM | POA: Diagnosis not present

## 2021-08-23 LAB — BASIC METABOLIC PANEL
Anion gap: 4 — ABNORMAL LOW (ref 5–15)
BUN: 31 mg/dL — ABNORMAL HIGH (ref 8–23)
CO2: 30 mmol/L (ref 22–32)
Calcium: 7.9 mg/dL — ABNORMAL LOW (ref 8.9–10.3)
Chloride: 106 mmol/L (ref 98–111)
Creatinine, Ser: 1.17 mg/dL (ref 0.61–1.24)
GFR, Estimated: 56 mL/min — ABNORMAL LOW (ref >60)
Glucose, Bld: 129 mg/dL — ABNORMAL HIGH (ref 70–99)
Potassium: 3.7 mmol/L (ref 3.5–5.1)
Sodium: 140 mmol/L (ref 135–145)

## 2021-08-23 LAB — MAGNESIUM: Magnesium: 2.1 mg/dL (ref 1.7–2.4)

## 2021-08-23 MED ORDER — SODIUM CHLORIDE 0.9 % IV SOLN: INTRAVENOUS | Status: DC

## 2021-08-23 MED ORDER — SODIUM CHLORIDE 0.9 % IV SOLN
INTRAVENOUS | Status: DC | PRN
  Administered 2021-08-23: 10 mL via INTRAVENOUS
  Administered 2021-08-23: 250 mL via INTRAVENOUS
  Administered 2021-08-30: 500 mL via INTRAVENOUS

## 2021-08-23 NOTE — Progress Notes (Addendum)
Progress Note    [name]  DPO:[id number] DOB: [birth year]  DOA: 08/21/2021 PCP: Crecencio Mc, MD      Brief Narrative:    Medical records reviewed and are as summarized below:  Jason Powers is a 87 y.o. male presented to the hospital after a fall at home.      Assessment/Plan:   Principal Problem:   Hypotension Active Problems:   Diverticulitis   Hypokalemia   Cervical stenosis of spinal canal   Sepsis (HCC)    Body mass index is 21.06 kg/m.   Sepsis secondary to acute sigmoid diverticulitis, leukocytosis: Continue empiric IV Zosyn.  Analgesics as needed for pain.  Repeat CBC tomorrow  Hypotension: Restart IV fluids.  Hypokalemia: Improved.  S/p mechanical fall, cervical spinal stenosis, old T9 and T12 compression fractures: PT evaluation.  CKD stage IIIa: Creatinine is stable.  No evidence of AKI.  BPH with urinary retention: Foley catheter in place.  Continue Flomax.  Other comorbidities include COPD, hypertension, iron deficiency anemia   Diet Order             Diet full liquid Room service appropriate? Yes; Fluid consistency: Thin  Diet effective now                      Consultants: None  Procedures: None    Medications:    Chlorhexidine Gluconate Cloth  6 each Topical Daily   enoxaparin (LOVENOX) injection  40 mg Subcutaneous Q24H   Ferrous Fumarate  1 tablet Oral Q breakfast   tamsulosin  0.4 mg Oral Daily   Continuous Infusions:  sodium chloride Stopped (08/23/21 1429)   sodium chloride     piperacillin-tazobactam (ZOSYN)  IV 3.375 g (08/23/21 1442)     Anti-infectives (From admission, onward)    Start     Dose/Rate Route Frequency Ordered Stop   08/22/21 0600  piperacillin-tazobactam (ZOSYN) IVPB 3.375 g        3.375 g 12.5 mL/hr over 240 Minutes Intravenous Every 8 hours 08/22/21 0215     08/21/21 2245  piperacillin-tazobactam (ZOSYN) IVPB 3.375 g        3.375 g 100 mL/hr over 30 Minutes  Intravenous  Once 08/21/21 2235 08/21/21 2333              Family Communication/Anticipated D/C date and plan/Code Status   DVT prophylaxis: enoxaparin (LOVENOX) injection 40 mg Start: 08/23/21 0800     Code Status: DNR  Family Communication: None Disposition Plan:    Status is: Inpatient  Remains inpatient appropriate because:IV treatments appropriate due to intensity of illness or inability to take PO and Inpatient level of care appropriate due to severity of illness  Dispo: The patient is from: Home              Anticipated d/c is to: Home              Patient currently is not medically stable to d/c.   Difficult to place patient No           Subjective:   Interval events noted.  He complains of abdominal pain.  He said he had diarrhea this morning.  No vomiting.  Objective:    Vitals:   08/23/21 0424 08/23/21 0810 08/23/21 1142 08/23/21 1534  BP: (!) 105/55 (!) 109/45 (!) 84/44 (!) 97/50  Pulse:  73 77 82  Resp: 20 18 18 18   Temp: 98.2 F (36.8 C) 97.8 F (36.6 C)  98.4 F (36.9 C) 97.7 F (36.5 C)  TempSrc: Oral Oral Oral Oral  SpO2: 97% 94% 95% 94%  Weight:      Height:       No data found.   Intake/Output Summary (Last 24 hours) at 08/23/2021 1747 Last data filed at 08/23/2021 0426 Gross per 24 hour  Intake 192.46 ml  Output 301 ml  Net -108.54 ml   Filed Weights   08/21/21 1953 08/22/21 2015  Weight: 59 kg 61 kg    Exam:  GEN: NAD SKIN: Warm and dry EYES: EOMI ENT: MMM CV: RRR PULM: CTA B ABD: soft, ND, LLQ tenderness, +BS CNS: AAO x 3, non focal EXT: No edema or tenderness GU: Foley catheter draining amber urine       Data Reviewed:   I have personally reviewed following labs and imaging studies:  Labs: Labs show the following:   Basic Metabolic Panel: Recent Labs  Lab 08/21/21 1959 08/22/21 0641 08/23/21 0512  NA 133* 137 140  K 3.3* 3.2* 3.7  CL 93* 100 106  CO2 27 27 30   GLUCOSE 188* 148* 129*   BUN 31* 29* 31*  CREATININE 1.23 1.11 1.17  CALCIUM 8.5* 8.4* 7.9*  MG  --   --  2.1   GFR Estimated Creatinine Clearance: 30.4 mL/min (by C-G formula based on SCr of 1.17 mg/dL). Liver Function Tests: Recent Labs  Lab 08/21/21 1959  AST 26  ALT 19  ALKPHOS 45  BILITOT 1.6*  PROT 6.3*  ALBUMIN 3.6   Recent Labs  Lab 08/21/21 1959  LIPASE 37   No results for input(s): AMMONIA in the last 168 hours. Coagulation profile No results for input(s): INR, PROTIME in the last 168 hours.  CBC: Recent Labs  Lab 08/21/21 1959 08/22/21 0641  WBC 29.5* 26.2*  NEUTROABS 27.4*  --   HGB 10.6* 10.8*  HCT 31.7* 33.8*  MCV 105.3* 104.3*  PLT 278 270   Cardiac Enzymes: No results for input(s): CKTOTAL, CKMB, CKMBINDEX, TROPONINI in the last 168 hours. BNP (last 3 results) No results for input(s): PROBNP in the last 8760 hours. CBG: No results for input(s): GLUCAP in the last 168 hours. D-Dimer: No results for input(s): DDIMER in the last 72 hours. Hgb A1c: No results for input(s): HGBA1C in the last 72 hours. Lipid Profile: No results for input(s): CHOL, HDL, LDLCALC, TRIG, CHOLHDL, LDLDIRECT in the last 72 hours. Thyroid function studies: No results for input(s): TSH, T4TOTAL, T3FREE, THYROIDAB in the last 72 hours.  Invalid input(s): FREET3 Anemia work up: No results for input(s): VITAMINB12, FOLATE, FERRITIN, TIBC, IRON, RETICCTPCT in the last 72 hours. Sepsis Labs: Recent Labs  Lab 08/21/21 1959 08/21/21 2233 08/22/21 0641  WBC 29.5*  --  26.2*  LATICACIDVEN  --  1.4 1.3    Microbiology Recent Results (from the past 240 hour(s))  Resp Panel by RT-PCR (Flu A&B, Covid) Nasopharyngeal Swab     Status: None   Collection Time: 08/21/21  7:59 PM   Specimen: Nasopharyngeal Swab; Nasopharyngeal(NP) swabs in vial transport medium  Result Value Ref Range Status   SARS Coronavirus 2 by RT PCR NEGATIVE NEGATIVE Final    Comment: (NOTE) SARS-CoV-2 target nucleic acids  are NOT DETECTED.  The SARS-CoV-2 RNA is generally detectable in upper respiratory specimens during the acute phase of infection. The lowest concentration of SARS-CoV-2 viral copies this assay can detect is 138 copies/mL. A negative result does not preclude SARS-Cov-2 infection and should not be used  as the sole basis for treatment or other patient management decisions. A negative result may occur with  improper specimen collection/handling, submission of specimen other than nasopharyngeal swab, presence of viral mutation(s) within the areas targeted by this assay, and inadequate number of viral copies(<138 copies/mL). A negative result must be combined with clinical observations, patient history, and epidemiological information. The expected result is Negative.  Fact Sheet for Patients:  EntrepreneurPulse.com.au  Fact Sheet for Healthcare Providers:  IncredibleEmployment.be  This test is no t yet approved or cleared by the [place] FDA and  has been authorized for detection and/or diagnosis of SARS-CoV-2 by FDA under an Emergency Use Authorization (EUA). This EUA will remain  in effect (meaning this test can be used) for the duration of the COVID-19 declaration under Section 564(b)(1) of the Act, 21 U.S.C.section 360bbb-3(b)(1), unless the authorization is terminated  or revoked sooner.       Influenza A by PCR NEGATIVE NEGATIVE Final   Influenza B by PCR NEGATIVE NEGATIVE Final    Comment: (NOTE) The Xpert Xpress SARS-CoV-2/FLU/RSV plus assay is intended as an aid in the diagnosis of influenza from Nasopharyngeal swab specimens and should not be used as a sole basis for treatment. Nasal washings and aspirates are unacceptable for Xpert Xpress SARS-CoV-2/FLU/RSV testing.  Fact Sheet for Patients: EntrepreneurPulse.com.au  Fact Sheet for Healthcare Providers: IncredibleEmployment.be  This test is  not yet approved or cleared by the [place] FDA and has been authorized for detection and/or diagnosis of SARS-CoV-2 by FDA under an Emergency Use Authorization (EUA). This EUA will remain in effect (meaning this test can be used) for the duration of the COVID-19 declaration under Section 564(b)(1) of the Act, 21 U.S.C. section 360bbb-3(b)(1), unless the authorization is terminated or revoked.  Performed at [facility], [address], [place], [place] [address]   Urine Culture     Status: None   Collection Time: 08/21/21  7:59 PM   Specimen: Urine, Random  Result Value Ref Range Status   Specimen Description   Final    URINE, RANDOM Performed at [facility], [address], [place], [place] [address]    Special Requests   Final    NONE Performed at [facility], [address], [place], [place] [address]    Culture   Final    NO GROWTH Performed at [facility], [place] [address] [place], [place] [address]    Report Status 08/22/2021 FINAL  Final  Blood culture (routine x 2)     Status: None (Preliminary result)   Collection Time: 08/21/21 10:34 PM   Specimen: BLOOD  Result Value Ref Range Status   Specimen Description BLOOD LEFT ASSIST CONTROL  Final   Special Requests   Final    BOTTLES DRAWN AEROBIC AND ANAEROBIC Blood Culture adequate volume   Culture   Final    NO GROWTH 2 DAYS Performed at [facility], [address], [place], [place] [address]    Report Status PENDING  Incomplete  Blood culture (routine x 2)     Status: None (Preliminary result)   Collection Time: 08/21/21 10:50 PM   Specimen: BLOOD  Result Value Ref Range Status   Specimen Description BLOOD RIGHT FOREARM  Final   Special Requests   Final    BOTTLES DRAWN AEROBIC AND ANAEROBIC Blood Culture results may not be optimal due to an inadequate volume of blood received in culture bottles   Culture   Final  NO GROWTH 2 DAYS Performed at [facility], [address] [place], [place] [address]    Report Status PENDING  Incomplete    Procedures and diagnostic studies:  CT HEAD WO CONTRAST (5MM)  Result Date: 08/21/2021 CLINICAL DATA:  Neck trauma after a fall. Struck head without loss of consciousness. Back pain and headache. EXAM: CT HEAD WITHOUT CONTRAST CT CERVICAL SPINE WITHOUT CONTRAST TECHNIQUE: Multidetector CT imaging of the head and cervical spine was performed following the standard protocol without intravenous contrast. Multiplanar CT image reconstructions of the cervical spine were also generated. COMPARISON:  None. FINDINGS: CT HEAD FINDINGS Brain: Diffuse cerebral atrophy. Ventricular dilatation consistent with central atrophy. Low-attenuation changes in the deep white matter consistent with small vessel ischemia. No abnormal extra-axial fluid collections. No mass effect or midline shift. Gray-white matter junctions are distinct. Basal cisterns are not effaced. No acute intracranial hemorrhage. Vascular: Mild intracranial arterial vascular calcifications. Skull: Calvarium appears intact. Sinuses/Orbits: Paranasal sinuses and mastoid air cells are clear. Other: None. CT CERVICAL SPINE FINDINGS Alignment: 4 mm anterior subluxation of C3 on C4. Normal alignment of the facet joints. This could represent degenerative change but ligamentous injury may be present. Skull base and vertebrae: Skull base appears intact. No vertebral compression deformities. No focal bone lesion or bone destruction. Bone cortex appears intact. Soft tissues and spinal canal: No prevertebral soft tissue swelling. No abnormal paraspinal soft tissue mass or infiltration. Disc levels: Degenerative changes throughout with narrowed disc spaces and endplate osteophyte formation. Degenerative changes in the facet joints. Uncovertebral spurring and facet joint hypertrophy cause encroachment upon the neural foramina bilaterally. Upper chest: Calcification and  scarring in the lung apices, greater on the right. Emphysematous changes in the lung apices. Other: None. IMPRESSION: 1. No acute intracranial abnormalities. Chronic atrophy and small vessel ischemic changes. 2. Mild anterior subluxation of C3 on C4, possibly degenerative but ligamentous injury could be present. Consider MRI to assess for ligamentous injury. No acute fractures are identified. Prominent degenerative changes throughout the cervical spine. Electronically Signed   By: Lucienne Capers M.D.   On: 08/21/2021 22:03   CT Angio Chest PE W and/or Wo Contrast  Result Date: 08/21/2021 CLINICAL DATA:  Abdominal distention after a fall. Pulmonary embolus is suspected with high probability. EXAM: CT ANGIOGRAPHY CHEST CT ABDOMEN AND PELVIS WITH CONTRAST TECHNIQUE: Multidetector CT imaging of the chest was performed using the standard protocol during bolus administration of intravenous contrast. Multiplanar CT image reconstructions and MIPs were obtained to evaluate the vascular anatomy. Multidetector CT imaging of the abdomen and pelvis was performed using the standard protocol during bolus administration of intravenous contrast. CONTRAST:  13mL OMNIPAQUE IOHEXOL 350 MG/ML SOLN COMPARISON:  CTA abdomen and pelvis 08/10/2021 FINDINGS: CTA CHEST FINDINGS Cardiovascular: Good opacification of the central and segmental pulmonary arteries. No focal filling defects. No evidence of significant pulmonary embolus. Normal heart size. No pericardial effusions. Normal caliber thoracic aorta. Scattered aortic calcification. No aortic dissection. Great vessel origins are patent. Mediastinum/Nodes: Thyroid gland is unremarkable. Esophagus is decompressed. No significant lymphadenopathy. Lungs/Pleura: Prominent diffuse emphysematous changes throughout the lungs. Scattered calcified granulomas. Fibrocalcific changes in the lung apices particularly on the right consistent with postinflammatory change. No airspace disease or  consolidation. Mild dependent atelectasis. No pleural effusions. No pneumothorax. Musculoskeletal: Compression fractures at T12 and T9 vertebra. Lesions were present on prior thoracic spine radiograph from 06/13/2021 and prior MRI from 06/28/2021. Retropulsion of fracture fragments at T12 is again demonstrated, unchanged. Sternum and ribs are  nondisplaced. Review of the MIP images confirms the above findings. CT ABDOMEN and PELVIS FINDINGS Hepatobiliary: No focal liver abnormality is seen. No gallstones, gallbladder wall thickening, or biliary dilatation. Pancreas: Unremarkable. No pancreatic ductal dilatation or surrounding inflammatory changes. Spleen: Normal in size without focal abnormality. Adrenals/Urinary Tract: No adrenal gland nodules. Mild renal parenchymal atrophy. Nephrograms are symmetrical. 2 mm stone in the lower pole left kidney. No hydronephrosis or hydroureter. Diffuse bladder wall thickening with trabeculation and left posterior bladder wall diverticulum. Changes likely due to chronic outlet obstruction. Stomach/Bowel: Stomach, small bowel, and colon are not abnormally distended. Diverticulosis throughout the colon, most prominent in the sigmoid region. There is mild stranding around the sigmoid colon which may indicate early changes of acute diverticulitis. No abscess. Prominent stool-filled rectum with mild rectal wall thickening could indicate stercoral colitis. Appendix is not identified. Vascular/Lymphatic: Aortic atherosclerosis. No enlarged abdominal or pelvic lymph nodes. Reproductive: Diffuse prostate enlargement. Other: No free air or free fluid in the abdomen. Abdominal wall musculature appears intact. Musculoskeletal: Degenerative changes in the spine. No destructive bone lesions. Review of the MIP images confirms the above findings. IMPRESSION: 1. No evidence of significant pulmonary embolus. 2. Diffuse emphysematous changes in the lungs. Postinflammatory calcifications. No airspace  disease. 3. Diffuse aortic atherosclerosis. 4. Nonobstructing stone in the lower pole left kidney. Bilateral renal parenchymal atrophy. 5. Bladder wall thickening and trabeculation with bladder wall diverticulum. Prostate enlargement. Findings likely represent chronic outlet obstruction. 6. Compression fractures of T9 and T12 vertebral bodies, unchanged since prior studies. 7. Diverticulosis of the sigmoid colon with mild stranding in the pericolonic fat possibly indicating early acute diverticulitis. No abscess. Electronically Signed   By: Lucienne Capers M.D.   On: 08/21/2021 22:18   CT Cervical Spine Wo Contrast  Result Date: 08/21/2021 CLINICAL DATA:  Neck trauma after a fall. Struck head without loss of consciousness. Back pain and headache. EXAM: CT HEAD WITHOUT CONTRAST CT CERVICAL SPINE WITHOUT CONTRAST TECHNIQUE: Multidetector CT imaging of the head and cervical spine was performed following the standard protocol without intravenous contrast. Multiplanar CT image reconstructions of the cervical spine were also generated. COMPARISON:  None. FINDINGS: CT HEAD FINDINGS Brain: Diffuse cerebral atrophy. Ventricular dilatation consistent with central atrophy. Low-attenuation changes in the deep white matter consistent with small vessel ischemia. No abnormal extra-axial fluid collections. No mass effect or midline shift. Gray-white matter junctions are distinct. Basal cisterns are not effaced. No acute intracranial hemorrhage. Vascular: Mild intracranial arterial vascular calcifications. Skull: Calvarium appears intact. Sinuses/Orbits: Paranasal sinuses and mastoid air cells are clear. Other: None. CT CERVICAL SPINE FINDINGS Alignment: 4 mm anterior subluxation of C3 on C4. Normal alignment of the facet joints. This could represent degenerative change but ligamentous injury may be present. Skull base and vertebrae: Skull base appears intact. No vertebral compression deformities. No focal bone lesion or bone  destruction. Bone cortex appears intact. Soft tissues and spinal canal: No prevertebral soft tissue swelling. No abnormal paraspinal soft tissue mass or infiltration. Disc levels: Degenerative changes throughout with narrowed disc spaces and endplate osteophyte formation. Degenerative changes in the facet joints. Uncovertebral spurring and facet joint hypertrophy cause encroachment upon the neural foramina bilaterally. Upper chest: Calcification and scarring in the lung apices, greater on the right. Emphysematous changes in the lung apices. Other: None. IMPRESSION: 1. No acute intracranial abnormalities. Chronic atrophy and small vessel ischemic changes. 2. Mild anterior subluxation of C3 on C4, possibly degenerative but ligamentous injury could be present. Consider MRI to assess for  ligamentous injury. No acute fractures are identified. Prominent degenerative changes throughout the cervical spine. Electronically Signed   By: Lucienne Capers M.D.   On: 08/21/2021 22:03   MR Cervical Spine Wo Contrast  Result Date: 08/22/2021 CLINICAL DATA:  Fall EXAM: MRI CERVICAL SPINE WITHOUT CONTRAST TECHNIQUE: Multiplanar, multisequence MR imaging of the cervical spine was performed. No intravenous contrast was administered. COMPARISON:  None. FINDINGS: Alignment: Grade 1 anterolisthesis at C3-4 Vertebrae: No fracture, evidence of discitis, or bone lesion. Cord: Normal signal and morphology. Posterior Fossa, vertebral arteries, paraspinal tissues: Negative. Disc levels: Axial images are severely degraded by motion, limiting detailed assessment of the disc spaces. Within that limitation, there is severe left C3-4 neural foraminal stenosis with mild spinal canal stenosis IMPRESSION: 1. Severely motion degraded examination. 2. No acute abnormality of the cervical spine. 3. Mild spinal canal stenosis at C3-4. Severe left C4 neural foraminal stenosis. Electronically Signed   By: Ulyses Jarred M.D.   On: 08/22/2021 01:47   CT  ABDOMEN PELVIS W CONTRAST  Result Date: 08/21/2021 CLINICAL DATA:  Abdominal distention after a fall. Pulmonary embolus is suspected with high probability. EXAM: CT ANGIOGRAPHY CHEST CT ABDOMEN AND PELVIS WITH CONTRAST TECHNIQUE: Multidetector CT imaging of the chest was performed using the standard protocol during bolus administration of intravenous contrast. Multiplanar CT image reconstructions and MIPs were obtained to evaluate the vascular anatomy. Multidetector CT imaging of the abdomen and pelvis was performed using the standard protocol during bolus administration of intravenous contrast. CONTRAST:  11mL OMNIPAQUE IOHEXOL 350 MG/ML SOLN COMPARISON:  CTA abdomen and pelvis 08/10/2021 FINDINGS: CTA CHEST FINDINGS Cardiovascular: Good opacification of the central and segmental pulmonary arteries. No focal filling defects. No evidence of significant pulmonary embolus. Normal heart size. No pericardial effusions. Normal caliber thoracic aorta. Scattered aortic calcification. No aortic dissection. Great vessel origins are patent. Mediastinum/Nodes: Thyroid gland is unremarkable. Esophagus is decompressed. No significant lymphadenopathy. Lungs/Pleura: Prominent diffuse emphysematous changes throughout the lungs. Scattered calcified granulomas. Fibrocalcific changes in the lung apices particularly on the right consistent with postinflammatory change. No airspace disease or consolidation. Mild dependent atelectasis. No pleural effusions. No pneumothorax. Musculoskeletal: Compression fractures at T12 and T9 vertebra. Lesions were present on prior thoracic spine radiograph from 06/13/2021 and prior MRI from 06/28/2021. Retropulsion of fracture fragments at T12 is again demonstrated, unchanged. Sternum and ribs are nondisplaced. Review of the MIP images confirms the above findings. CT ABDOMEN and PELVIS FINDINGS Hepatobiliary: No focal liver abnormality is seen. No gallstones, gallbladder wall thickening, or biliary  dilatation. Pancreas: Unremarkable. No pancreatic ductal dilatation or surrounding inflammatory changes. Spleen: Normal in size without focal abnormality. Adrenals/Urinary Tract: No adrenal gland nodules. Mild renal parenchymal atrophy. Nephrograms are symmetrical. 2 mm stone in the lower pole left kidney. No hydronephrosis or hydroureter. Diffuse bladder wall thickening with trabeculation and left posterior bladder wall diverticulum. Changes likely due to chronic outlet obstruction. Stomach/Bowel: Stomach, small bowel, and colon are not abnormally distended. Diverticulosis throughout the colon, most prominent in the sigmoid region. There is mild stranding around the sigmoid colon which may indicate early changes of acute diverticulitis. No abscess. Prominent stool-filled rectum with mild rectal wall thickening could indicate stercoral colitis. Appendix is not identified. Vascular/Lymphatic: Aortic atherosclerosis. No enlarged abdominal or pelvic lymph nodes. Reproductive: Diffuse prostate enlargement. Other: No free air or free fluid in the abdomen. Abdominal wall musculature appears intact. Musculoskeletal: Degenerative changes in the spine. No destructive bone lesions. Review of the MIP images confirms the above findings. IMPRESSION:  1. No evidence of significant pulmonary embolus. 2. Diffuse emphysematous changes in the lungs. Postinflammatory calcifications. No airspace disease. 3. Diffuse aortic atherosclerosis. 4. Nonobstructing stone in the lower pole left kidney. Bilateral renal parenchymal atrophy. 5. Bladder wall thickening and trabeculation with bladder wall diverticulum. Prostate enlargement. Findings likely represent chronic outlet obstruction. 6. Compression fractures of T9 and T12 vertebral bodies, unchanged since prior studies. 7. Diverticulosis of the sigmoid colon with mild stranding in the pericolonic fat possibly indicating early acute diverticulitis. No abscess. Electronically Signed   By:  Lucienne Capers M.D.   On: 08/21/2021 22:18   CT T-SPINE NO CHARGE  Result Date: 08/21/2021 CLINICAL DATA:  Fall EXAM: CT THORACIC SPINE WITHOUT CONTRAST TECHNIQUE: Multidetector CT images of the thoracic were obtained using the standard protocol without intravenous contrast. COMPARISON:  CT abdomen pelvis 08/10/2021 Thoracic spine radiographs 06/13/2021 FINDINGS: Alignment: Normal. Vertebrae: Compression fracture at T12 with greater than 50% height loss and 9 mm of retropulsion is unchanged. There is also an old compression fracture of T9 with less than 25% height loss and no retropulsion. Paraspinal and other soft tissues: Emphysema and calcific atherosclerosis of the aorta. Disc levels: There is moderate spinal canal narrowing at the T12 level due to the retropulsed fragment. Otherwise, no spinal canal stenosis. IMPRESSION: 1. Unchanged appearance of T12 compression fracture with greater than 50% height loss and 9 mm of retropulsion resulting in moderate spinal canal narrowing. 2. Old compression fracture of T9 with less than 25% height loss and no retropulsion. Aortic Atherosclerosis (ICD10-I70.0). Electronically Signed   By: Ulyses Jarred M.D.   On: 08/21/2021 22:06   CT L-SPINE NO CHARGE  Result Date: 08/21/2021 CLINICAL DATA:  Fall EXAM: CT LUMBAR SPINE WITHOUT CONTRAST TECHNIQUE: Multidetector CT imaging of the lumbar spine was performed without intravenous contrast administration. Multiplanar CT image reconstructions were also generated. COMPARISON:  None. FINDINGS: Segmentation: 5 lumbar type vertebrae. Alignment: Normal. Vertebrae: No acute fracture or focal pathologic process. Paraspinal and other soft tissues: Calcific aortic atherosclerosis Disc levels: Multilevel degenerative disc disease and facet arthrosis. No spinal canal stenosis. No neural impingement. IMPRESSION: 1. No acute fracture or static subluxation of the lumbar spine. 2. Multilevel degenerative disc disease and facet arthrosis  without spinal canal stenosis or neural impingement. 3. T12 compression fracture described on concomitant thoracic spine CT. Aortic Atherosclerosis (ICD10-I70.0). Electronically Signed   By: Ulyses Jarred M.D.   On: 08/21/2021 22:14               LOS: 2 days   Isabela Nardelli  [facility]   Pager on [email or web address]. If 7PM-7AM, please contact night-coverage at [email or web address]     08/23/2021, 5:47 PM

## 2021-08-24 ENCOUNTER — Ambulatory Visit: Payer: Medicare Other | Admitting: Physical Therapy

## 2021-08-24 ENCOUNTER — Ambulatory Visit: Payer: Medicare Other | Admitting: Internal Medicine

## 2021-08-24 ENCOUNTER — Telehealth: Payer: Self-pay | Admitting: Internal Medicine

## 2021-08-24 DIAGNOSIS — E876 Hypokalemia: Secondary | ICD-10-CM | POA: Diagnosis not present

## 2021-08-24 DIAGNOSIS — A419 Sepsis, unspecified organism: Secondary | ICD-10-CM | POA: Diagnosis not present

## 2021-08-24 DIAGNOSIS — K5792 Diverticulitis of intestine, part unspecified, without perforation or abscess without bleeding: Secondary | ICD-10-CM | POA: Diagnosis not present

## 2021-08-24 DIAGNOSIS — I9589 Other hypotension: Secondary | ICD-10-CM | POA: Diagnosis not present

## 2021-08-24 LAB — CBC WITH DIFFERENTIAL/PLATELET
Abs Immature Granulocytes: 0.1 10*3/uL — ABNORMAL HIGH (ref 0.00–0.07)
Basophils Absolute: 0 10*3/uL (ref 0.0–0.1)
Basophils Relative: 0 %
Eosinophils Absolute: 0.2 10*3/uL (ref 0.0–0.5)
Eosinophils Relative: 2 %
HCT: 27.2 % — ABNORMAL LOW (ref 39.0–52.0)
Hemoglobin: 8.7 g/dL — ABNORMAL LOW (ref 13.0–17.0)
Immature Granulocytes: 1 %
Lymphocytes Relative: 4 %
Lymphs Abs: 0.6 10*3/uL — ABNORMAL LOW (ref 0.7–4.0)
MCH: 34 pg (ref 26.0–34.0)
MCHC: 32 g/dL (ref 30.0–36.0)
MCV: 106.3 fL — ABNORMAL HIGH (ref 80.0–100.0)
Monocytes Absolute: 0.9 10*3/uL (ref 0.1–1.0)
Monocytes Relative: 6 %
Neutro Abs: 12.2 10*3/uL — ABNORMAL HIGH (ref 1.7–7.7)
Neutrophils Relative %: 87 %
Platelets: 219 10*3/uL (ref 150–400)
RBC: 2.56 MIL/uL — ABNORMAL LOW (ref 4.22–5.81)
RDW: 18.9 % — ABNORMAL HIGH (ref 11.5–15.5)
WBC: 14 10*3/uL — ABNORMAL HIGH (ref 4.0–10.5)
nRBC: 0 % (ref 0.0–0.2)

## 2021-08-24 LAB — BASIC METABOLIC PANEL
Anion gap: 7 (ref 5–15)
BUN: 21 mg/dL (ref 8–23)
CO2: 27 mmol/L (ref 22–32)
Calcium: 7.6 mg/dL — ABNORMAL LOW (ref 8.9–10.3)
Chloride: 102 mmol/L (ref 98–111)
Creatinine, Ser: 1.02 mg/dL (ref 0.61–1.24)
GFR, Estimated: >60 mL/min (ref >60)
Glucose, Bld: 106 mg/dL — ABNORMAL HIGH (ref 70–99)
Potassium: 2.8 mmol/L — ABNORMAL LOW (ref 3.5–5.1)
Sodium: 136 mmol/L (ref 135–145)

## 2021-08-24 MED ORDER — POTASSIUM CHLORIDE IN NACL 20-0.9 MEQ/L-% IV SOLN
INTRAVENOUS | Status: DC
  Filled 2021-08-24 (×2): qty 1000

## 2021-08-24 MED ORDER — POTASSIUM CHLORIDE CRYS ER 20 MEQ PO TBCR
40.0000 meq | EXTENDED_RELEASE_TABLET | ORAL | Status: AC
  Administered 2021-08-24 (×2): 40 meq via ORAL
  Filled 2021-08-24 (×2): qty 4

## 2021-08-24 NOTE — Progress Notes (Signed)
Progress Note    Jason Powers  KDT:[id number] DOB: 1920/12/23  DOA: 08/21/2021 PCP: Crecencio Mc, MD      Brief Narrative:    Medical records reviewed and are as summarized below:  Jason Powers is a 87 y.o. male presented to the hospital after a fall at home.      Assessment/Plan:   Principal Problem:   Hypotension Active Problems:   Diverticulitis   Hypokalemia   Cervical stenosis of spinal canal   Sepsis (HCC)    Body mass index is 21.06 kg/m.   Sepsis secondary to acute sigmoid diverticulitis, leukocytosis: Leukocytosis is improving.  Continue IV Zosyn.  Analgesics as needed for pain.   Hypotension: BP is better.  Discontinue IV fluids.  Hypokalemia: Replete potassium and monitor levels  Diarrhea: He had diarrhea yesterday but this is improving.  Encouraged adequate oral hydration.  S/p mechanical fall, cervical spinal stenosis, old T9 and T12 compression fractures: PT evaluation.  CKD stage IIIa: Creatinine is stable.  No evidence of AKI.  BPH with urinary retention: Continue Flomax.  Remove Foley catheter and start voiding trial.   Other comorbidities include COPD, hypertension, iron deficiency anemia   Diet Order             Diet full liquid Room service appropriate? Yes; Fluid consistency: Thin  Diet effective now                      Consultants: None  Procedures: None    Medications:    Chlorhexidine Gluconate Cloth  6 each Topical Daily   enoxaparin (LOVENOX) injection  40 mg Subcutaneous Q24H   Ferrous Fumarate  1 tablet Oral Q breakfast   tamsulosin  0.4 mg Oral Daily   Continuous Infusions:  sodium chloride Stopped (08/24/21 1327)   0.9 % NaCl with KCl 20 mEq / L 50 mL/hr at 08/24/21 1500   piperacillin-tazobactam (ZOSYN)  IV 12.5 mL/hr at 08/24/21 1500     Anti-infectives (From admission, onward)    Start     Dose/Rate Route Frequency Ordered Stop   08/22/21 0600  piperacillin-tazobactam (ZOSYN)  IVPB 3.375 g        3.375 g 12.5 mL/hr over 240 Minutes Intravenous Every 8 hours 08/22/21 0215     08/21/21 2245  piperacillin-tazobactam (ZOSYN) IVPB 3.375 g        3.375 g 100 mL/hr over 30 Minutes Intravenous  Once 08/21/21 2235 08/21/21 2333              Family Communication/Anticipated D/C date and plan/Code Status   DVT prophylaxis: enoxaparin (LOVENOX) injection 40 mg Start: 08/23/21 0800     Code Status: DNR  Family Communication: Plan discussed with Janie, daughter, at the bedside Disposition Plan:    Status is: Inpatient  Remains inpatient appropriate because:IV treatments appropriate due to intensity of illness or inability to take PO and Inpatient level of care appropriate due to severity of illness  Dispo: The patient is from: Home              Anticipated d/c is to: Home              Patient currently is not medically stable to d/c.   Difficult to place patient No           Subjective:   Interval events noted.  He has no complaints.   Objective:    Vitals:   08/24/21 0349 08/24/21 0758 08/24/21 1206  08/24/21 1516  BP: 97/65 (!) 136/53 (!) 103/50 (!) 105/45  Pulse: 79 76 75 76  Resp: 18 18 18 18   Temp: 98.8 F (37.1 C) 98.1 F (36.7 C) 98 F (36.7 C) 98.5 F (36.9 C)  TempSrc:  Oral Oral Oral  SpO2: 92% 91% 93% 95%  Weight:      Height:       No data found.   Intake/Output Summary (Last 24 hours) at 08/24/2021 1536 Last data filed at 08/24/2021 1500 Gross per 24 hour  Intake 2147.71 ml  Output 900 ml  Net 1247.71 ml   Filed Weights   08/21/21 1953 08/22/21 2015  Weight: 59 kg 61 kg    Exam:  GEN: NAD SKIN: No rash EYES: EOMI ENT: MMM CV: RRR PULM: CTA B ABD: soft, ND, NT, +BS CNS: AAO x 2, non focal EXT: No edema or tenderness GU: Foley catheter draining amber urine         Data Reviewed:   I have personally reviewed following labs and imaging studies:  Labs: Labs show the following:   Basic  Metabolic Panel: Recent Labs  Lab 08/21/21 1959 08/22/21 0641 08/23/21 0512 08/24/21 0630  NA 133* 137 140 136  K 3.3* 3.2* 3.7 2.8*  CL 93* 100 106 102  CO2 27 27 30 27   GLUCOSE 188* 148* 129* 106*  BUN 31* 29* 31* 21  CREATININE 1.23 1.11 1.17 1.02  CALCIUM 8.5* 8.4* 7.9* 7.6*  MG  --   --  2.1  --    GFR Estimated Creatinine Clearance: 34.9 mL/min (by C-G formula based on SCr of 1.02 mg/dL). Liver Function Tests: Recent Labs  Lab 08/21/21 1959  AST 26  ALT 19  ALKPHOS 45  BILITOT 1.6*  PROT 6.3*  ALBUMIN 3.6   Recent Labs  Lab 08/21/21 1959  LIPASE 37   No results for input(s): AMMONIA in the last 168 hours. Coagulation profile No results for input(s): INR, PROTIME in the last 168 hours.  CBC: Recent Labs  Lab 08/21/21 1959 08/22/21 0641 08/24/21 0630  WBC 29.5* 26.2* 14.0*  NEUTROABS 27.4*  --  12.2*  HGB 10.6* 10.8* 8.7*  HCT 31.7* 33.8* 27.2*  MCV 105.3* 104.3* 106.3*  PLT 278 270 219   Cardiac Enzymes: No results for input(s): CKTOTAL, CKMB, CKMBINDEX, TROPONINI in the last 168 hours. BNP (last 3 results) No results for input(s): PROBNP in the last 8760 hours. CBG: No results for input(s): GLUCAP in the last 168 hours. D-Dimer: No results for input(s): DDIMER in the last 72 hours. Hgb A1c: No results for input(s): HGBA1C in the last 72 hours. Lipid Profile: No results for input(s): CHOL, HDL, LDLCALC, TRIG, CHOLHDL, LDLDIRECT in the last 72 hours. Thyroid function studies: No results for input(s): TSH, T4TOTAL, T3FREE, THYROIDAB in the last 72 hours.  Invalid input(s): FREET3 Anemia work up: No results for input(s): VITAMINB12, FOLATE, FERRITIN, TIBC, IRON, RETICCTPCT in the last 72 hours. Sepsis Labs: Recent Labs  Lab 08/21/21 1959 08/21/21 2233 08/22/21 0641 08/24/21 0630  WBC 29.5*  --  26.2* 14.0*  LATICACIDVEN  --  1.4 1.3  --     Microbiology Recent Results (from the past 240 hour(s))  Resp Panel by RT-PCR (Flu A&B,  Covid) Nasopharyngeal Swab     Status: None   Collection Time: 08/21/21  7:59 PM   Specimen: Nasopharyngeal Swab; Nasopharyngeal(NP) swabs in vial transport medium  Result Value Ref Range Status   SARS Coronavirus 2 by RT  PCR NEGATIVE NEGATIVE Final    Comment: (NOTE) SARS-CoV-2 target nucleic acids are NOT DETECTED.  The SARS-CoV-2 RNA is generally detectable in upper respiratory specimens during the acute phase of infection. The lowest concentration of SARS-CoV-2 viral copies this assay can detect is 138 copies/mL. A negative result does not preclude SARS-Cov-2 infection and should not be used as the sole basis for treatment or other patient management decisions. A negative result may occur with  improper specimen collection/handling, submission of specimen other than nasopharyngeal swab, presence of viral mutation(s) within the areas targeted by this assay, and inadequate number of viral copies(<138 copies/mL). A negative result must be combined with clinical observations, patient history, and epidemiological information. The expected result is Negative.  Fact Sheet for Patients:  EntrepreneurPulse.com.au  Fact Sheet for Healthcare Providers:  IncredibleEmployment.be  This test is no t yet approved or cleared by the [place] FDA and  has been authorized for detection and/or diagnosis of SARS-CoV-2 by FDA under an Emergency Use Authorization (EUA). This EUA will remain  in effect (meaning this test can be used) for the duration of the COVID-19 declaration under Section 564(b)(1) of the Act, 21 U.S.C.section 360bbb-3(b)(1), unless the authorization is terminated  or revoked sooner.       Influenza A by PCR NEGATIVE NEGATIVE Final   Influenza B by PCR NEGATIVE NEGATIVE Final    Comment: (NOTE) The Xpert Xpress SARS-CoV-2/FLU/RSV plus assay is intended as an aid in the diagnosis of influenza from Nasopharyngeal swab specimens and should  not be used as a sole basis for treatment. Nasal washings and aspirates are unacceptable for Xpert Xpress SARS-CoV-2/FLU/RSV testing.  Fact Sheet for Patients: EntrepreneurPulse.com.au  Fact Sheet for Healthcare Providers: IncredibleEmployment.be  This test is not yet approved or cleared by the [place] FDA and has been authorized for detection and/or diagnosis of SARS-CoV-2 by FDA under an Emergency Use Authorization (EUA). This EUA will remain in effect (meaning this test can be used) for the duration of the COVID-19 declaration under Section 564(b)(1) of the Act, 21 U.S.C. section 360bbb-3(b)(1), unless the authorization is terminated or revoked.  Performed at [facility], [address] [place], [place] [address]   Urine Culture     Status: None   Collection Time: 08/21/21  7:59 PM   Specimen: Urine, Random  Result Value Ref Range Status   Specimen Description   Final    URINE, RANDOM Performed at [facility], [address], [place], [place] [address]    Special Requests   Final    NONE Performed at [facility], [address] [place], [place] [address]    Culture   Final    NO GROWTH Performed at [facility], [place] [address], [place], [place] [address]    Report Status 08/22/2021 FINAL  Final  Blood culture (routine x 2)     Status: None (Preliminary result)   Collection Time: 08/21/21 10:34 PM   Specimen: BLOOD  Result Value Ref Range Status   Specimen Description BLOOD LEFT ASSIST CONTROL  Final   Special Requests   Final    BOTTLES DRAWN AEROBIC AND ANAEROBIC Blood Culture adequate volume   Culture   Final    NO GROWTH 3 DAYS Performed at [facility], [address], [place], [place] [address]    Report Status PENDING  Incomplete  Blood culture (routine x 2)     Status: None (Preliminary result)   Collection Time: 08/21/21 10:50  PM   Specimen: BLOOD  Result Value  Ref Range Status   Specimen Description BLOOD RIGHT FOREARM  Final   Special Requests   Final    BOTTLES DRAWN AEROBIC AND ANAEROBIC Blood Culture results may not be optimal due to an inadequate volume of blood received in culture bottles   Culture   Final    NO GROWTH 3 DAYS Performed at [facility], [address] [place], [place] [address]    Report Status PENDING  Incomplete    Procedures and diagnostic studies:  No results found.             LOS: 3 days   Alem Fahl  [facility]   Pager on [email or web address]. If 7PM-7AM, please contact night-coverage at [email or web address]     08/24/2021, 3:36 PM

## 2021-08-24 NOTE — Progress Notes (Signed)
Mobility Specialist - Progress Note   08/24/21 1600  Mobility  Activity Ambulated in room  Level of Assistance Minimal assist, patient does 75% or more  Assistive Device Front wheel walker  Distance Ambulated (ft) 5 ft  Mobility Ambulated with assistance in room  Mobility Response Tolerated well  Mobility performed by Mobility specialist  $Mobility charge 1 Mobility    Pt lying in bed upon arrival, utilizing RA. Mild wheezing noted, O2 maintained 90-91% throughout session. No c/o pain. Pt sat EOB with modA and extra time. MinA to stand. Pt ambulated 5' forward and began to have incontinent BM. Pt voiced shakiness and weakness in LE while standing and was assisted back to bed. Further activity deferred. NT entered to assist with pt hygiene care and clean up. Pt left in bed with needs in reach and alarm set.  Kathee Delton Mobility Specialist 08/24/21, 4:29 PM

## 2021-08-24 NOTE — Telephone Encounter (Signed)
Patients daughter called in to cancel his appointment for today due to him being in the hospital.

## 2021-08-24 NOTE — Telephone Encounter (Signed)
FYI

## 2021-08-25 DIAGNOSIS — A419 Sepsis, unspecified organism: Secondary | ICD-10-CM | POA: Diagnosis not present

## 2021-08-25 DIAGNOSIS — I9589 Other hypotension: Secondary | ICD-10-CM | POA: Diagnosis not present

## 2021-08-25 DIAGNOSIS — K5792 Diverticulitis of intestine, part unspecified, without perforation or abscess without bleeding: Secondary | ICD-10-CM | POA: Diagnosis not present

## 2021-08-25 DIAGNOSIS — E861 Hypovolemia: Secondary | ICD-10-CM | POA: Diagnosis not present

## 2021-08-25 LAB — CBC WITH DIFFERENTIAL/PLATELET
Abs Immature Granulocytes: 0.11 10*3/uL — ABNORMAL HIGH (ref 0.00–0.07)
Basophils Absolute: 0.1 10*3/uL (ref 0.0–0.1)
Basophils Relative: 0 %
Eosinophils Absolute: 0.3 10*3/uL (ref 0.0–0.5)
Eosinophils Relative: 2 %
HCT: 28.9 % — ABNORMAL LOW (ref 39.0–52.0)
Hemoglobin: 9.4 g/dL — ABNORMAL LOW (ref 13.0–17.0)
Immature Granulocytes: 1 %
Lymphocytes Relative: 4 %
Lymphs Abs: 0.6 10*3/uL — ABNORMAL LOW (ref 0.7–4.0)
MCH: 34.8 pg — ABNORMAL HIGH (ref 26.0–34.0)
MCHC: 32.5 g/dL (ref 30.0–36.0)
MCV: 107 fL — ABNORMAL HIGH (ref 80.0–100.0)
Monocytes Absolute: 0.9 10*3/uL (ref 0.1–1.0)
Monocytes Relative: 7 %
Neutro Abs: 12 10*3/uL — ABNORMAL HIGH (ref 1.7–7.7)
Neutrophils Relative %: 86 %
Platelets: 236 10*3/uL (ref 150–400)
RBC: 2.7 MIL/uL — ABNORMAL LOW (ref 4.22–5.81)
RDW: 18.5 % — ABNORMAL HIGH (ref 11.5–15.5)
WBC: 13.9 10*3/uL — ABNORMAL HIGH (ref 4.0–10.5)
nRBC: 0 % (ref 0.0–0.2)

## 2021-08-25 LAB — BASIC METABOLIC PANEL
Anion gap: 5 (ref 5–15)
BUN: 15 mg/dL (ref 8–23)
CO2: 27 mmol/L (ref 22–32)
Calcium: 7.7 mg/dL — ABNORMAL LOW (ref 8.9–10.3)
Chloride: 102 mmol/L (ref 98–111)
Creatinine, Ser: 0.94 mg/dL (ref 0.61–1.24)
GFR, Estimated: >60 mL/min (ref >60)
Glucose, Bld: 103 mg/dL — ABNORMAL HIGH (ref 70–99)
Potassium: 3.5 mmol/L (ref 3.5–5.1)
Sodium: 134 mmol/L — ABNORMAL LOW (ref 135–145)

## 2021-08-25 MED ORDER — POTASSIUM CHLORIDE CRYS ER 20 MEQ PO TBCR
40.0000 meq | EXTENDED_RELEASE_TABLET | Freq: Once | ORAL | Status: AC
  Administered 2021-08-25: 40 meq via ORAL
  Filled 2021-08-25: qty 4

## 2021-08-25 NOTE — Evaluation (Signed)
Physical Therapy Evaluation Patient Details Name: Jason Powers MRN: [id number] DOB: Apr 09, 1921 Today's Date: 08/25/2021  History of Present Illness  Pt is a 87 y.o. M w/ PMH significant for COPD, hypertension, iron deficiency anemia, dementia, BPH, history of thoracic compression fracture who presents with concerns of fall (per chart)  Clinical Impression  Pt alert in bed, oriented x 4 with some inconsistencies while providing history and PLOF. Confirmed living situation and PLOF with pt's daughter [name], in which prior to most recent admission pt was MOD I for amb w/ rollator for short distances with assistance for ADLs provided by family (driving, meals, etc). Overall pt demonstrates significant change from PLOF requiring MAX A for most mobility and is unable to take more than a couple steps w/ RW and physical assist secondary to weakness and fear of falling. Due to these changes, SNF is recommended at discharge. Skilled PT intervention is indicated to address deficits in function, mobility, and to return to PLOF as able.       Recommendations for follow up therapy are one component of a multi-disciplinary discharge planning process, led by the attending physician.  Recommendations may be updated based on patient status, additional functional criteria and insurance authorization.  Follow Up Recommendations SNF;Supervision/Assistance - 24 hour    Equipment Recommendations  Other (comment) (TBD next venue of care)    Recommendations for Other Services       Precautions / Restrictions Precautions Precautions: Fall Precaution Comments: weak Restrictions Weight Bearing Restrictions: No      Mobility  Bed Mobility Overal bed mobility: Needs Assistance Bed Mobility: Supine to Sit;Sit to Supine;Rolling Rolling: Mod assist   Supine to sit: Max assist Sit to supine: Max assist   General bed mobility comments: vcs for rolling & reaching; requires BLE and trunk assist, attempted to  allow pt time to perform movement independently to mimic PLOF but largely unable to mobilize due to weakness.    Transfers Overall transfer level: Needs assistance Equipment used: Rolling walker (2 wheeled) Transfers: Sit to/from Stand Sit to Stand: Max assist         General transfer comment: fearful of movement, increased trunk flexion  Ambulation/Gait Ambulation/Gait assistance: Max assist Gait Distance (Feet): 1 Feet Assistive device: Rolling walker (2 wheeled) Gait Pattern/deviations: Trunk flexed     General Gait Details: Pt able to slide feet forward but unable to fully step with pt consistently stating "I'm too weak"  Stairs            Wheelchair Mobility    Modified Rankin (Stroke Patients Only)       Balance Overall balance assessment: Needs assistance Sitting-balance support: Feet supported;Single extremity supported Sitting balance-Leahy Scale: Fair Sitting balance - Comments: Able to raise arms slightly off bed and reach but can not tolerate large dynamic challenges   Standing balance support: Bilateral upper extremity supported Standing balance-Leahy Scale: Zero Standing balance comment: MAX A, possibly 2/2 to FOF                             Pertinent Vitals/Pain Pain Assessment: Faces Faces Pain Scale: Hurts little more Pain Location: Lower back w/ movement Pain Descriptors / Indicators: Discomfort;Grimacing Pain Intervention(s): Limited activity within patient's tolerance;Monitored during session;Repositioned    Home Living Family/patient expects to be discharged to:: Private residence Living Arrangements: Alone Available Help at Discharge: Family;Available PRN/intermittently Type of Home: House Home Access: Stairs to enter Entrance Stairs-Rails: Left Entrance Stairs-Number of  Steps: 3 Home Layout: One level Home Equipment: [place] - 4 wheels;Shower seat - built in;Wheelchair - manual;Cane - single point Additional Comments:  WC for doctor's appointments    Prior Function Level of Independence: Needs assistance   Gait / Transfers Assistance Needed: Ambulates very short distances since fall ~ 2 months ago w/ rollator, performs STS by pulling up on walker; prior to 2 months pt was driving and walking in yard w/ [facility]  ADL's / Homemaking Assistance Needed: Pt's daughter assists with cooking , driving, and monitoring safety in showers (pt showers independently)  Comments: Pt has a significant fall history     Hand Dominance        Extremity/Trunk Assessment   Upper Extremity Assessment Upper Extremity Assessment: Generalized weakness    Lower Extremity Assessment Lower Extremity Assessment: Generalized weakness    Cervical / Trunk Assessment Cervical / Trunk Assessment: Kyphotic  Communication   Communication: No difficulties  Cognition Arousal/Alertness: Awake/alert Behavior During Therapy: WFL for tasks assessed/performed Overall Cognitive Status: History of cognitive impairments - at baseline                                 General Comments: Oriented to self, location, DOB, situation; pt able to provide most history with some inconsistencies      General Comments General comments (skin integrity, edema, etc.): HR elevated throughout session 95-100 at rest, SpO2 95%, increases to 137 w/ multi PVC following standing/stepping, 89 following activity.    Exercises     Assessment/Plan    PT Assessment Patient needs continued PT services  PT Problem List Decreased range of motion;Decreased strength;Decreased activity tolerance;Decreased balance;Decreased mobility;Decreased coordination       PT Treatment Interventions Gait training;DME instruction;Balance training;Stair training;Functional mobility training;Therapeutic activities;Therapeutic exercise    PT Goals (Current goals can be found in the Care Plan section)  Acute Rehab PT Goals Patient Stated Goal: walk without fear PT  Goal Formulation: With patient Time For Goal Achievement: 09/08/21 Potential to Achieve Goals: Fair    Frequency Min 2X/week   Barriers to discharge        Co-evaluation               AM-PAC PT "6 Clicks" Mobility  Outcome Measure Help needed turning from your back to your side while in a flat bed without using bedrails?: A Lot Help needed moving from lying on your back to sitting on the side of a flat bed without using bedrails?: A Lot Help needed moving to and from a bed to a chair (including a wheelchair)?: A Lot Help needed standing up from a chair using your arms (e.g., wheelchair or bedside chair)?: A Lot Help needed to walk in hospital room?: Total Help needed climbing 3-5 steps with a railing? : Total 6 Click Score: 10    End of Session Equipment Utilized During Treatment: Gait belt Activity Tolerance: Patient tolerated treatment well Patient left: in bed;with call bell/phone within reach;with bed alarm set Nurse Communication: Mobility status PT Visit Diagnosis: Other abnormalities of gait and mobility (R26.89);Repeated falls (R29.6);History of falling (Z91.81)    Time: 6834-1962 PT Time Calculation (min) (ACUTE ONLY): 41 min   Charges:             [employer], SPT

## 2021-08-25 NOTE — Progress Notes (Addendum)
Progress Note    Jason Powers  FUX:[id number] DOB: 07/08/1921  DOA: 08/21/2021 PCP: Crecencio Mc, MD      Brief Narrative:    Medical records reviewed and are as summarized below:  Jason Powers is a 87 y.o. male with medical history significant for recent discharge from the hospital on 08/11/2021 after treatment for acute sigmoid diverticulitis and acute on chronic anemia secondary to diverticular bleed (required blood transfusion) COPD, hypertension, deficiency anemia, BPH, thoracic compression fracture.  He was discharged on a 5-day course of Flagyl.  He presented to the hospital after a fall at home.  He said he was sitting on the toilet when he slid and fell.  He was found to have sepsis secondary to acute sigmoid diverticulitis.    Assessment/Plan:   Principal Problem:   Hypotension Active Problems:   Diverticulitis   Hypokalemia   Cervical stenosis of spinal canal   Sepsis (HCC)    Body mass index is 21.06 kg/m.   Sepsis secondary to acute sigmoid diverticulitis, leukocytosis: Leukocytosis is improving.  Continue IV Zosyn.  Analgesics as needed for pain.  Of note, patient was recently discharged on 08/11/2021 after hospitalization for acute diverticulitis and diverticular bleed.  Hypotension: Improved  Hypokalemia: Improved.  Continue potassium repletion.  Diarrhea: Improved  S/p mechanical fall, cervical spinal stenosis, old T9 and T12 compression fractures: PT evaluation.  CKD stage IIIa: Creatinine is stable.  No evidence of AKI.  BPH with urinary retention: Continue Flomax.  Foley catheter was removed today.  Debility, unsteady gait: Consult PT.  His daughters are interested in discharge to SNF.  Other comorbidities include COPD, hypertension, iron deficiency anemia  Plan discussed with with his 2 daughters at the bedside.  Diet Order             Diet regular Room service appropriate? Yes; Fluid consistency: Thin  Diet effective now                       Consultants: None  Procedures: None    Medications:    Chlorhexidine Gluconate Cloth  6 each Topical Daily   enoxaparin (LOVENOX) injection  40 mg Subcutaneous Q24H   Ferrous Fumarate  1 tablet Oral Q breakfast   tamsulosin  0.4 mg Oral Daily   Continuous Infusions:  sodium chloride Stopped (08/24/21 1327)   piperacillin-tazobactam (ZOSYN)  IV 3.375 g (08/25/21 0523)     Anti-infectives (From admission, onward)    Start     Dose/Rate Route Frequency Ordered Stop   08/22/21 0600  piperacillin-tazobactam (ZOSYN) IVPB 3.375 g        3.375 g 12.5 mL/hr over 240 Minutes Intravenous Every 8 hours 08/22/21 0215 09/05/21 0559   08/21/21 2245  piperacillin-tazobactam (ZOSYN) IVPB 3.375 g        3.375 g 100 mL/hr over 30 Minutes Intravenous  Once 08/21/21 2235 08/21/21 2333              Family Communication/Anticipated D/C date and plan/Code Status   DVT prophylaxis: enoxaparin (LOVENOX) injection 40 mg Start: 08/23/21 0800     Code Status: DNR  Family Communication: Plan discussed with [name] and [name] (daughters) at the bedside. Disposition Plan:    Status is: Inpatient  Remains inpatient appropriate because:IV treatments appropriate due to intensity of illness or inability to take PO and Inpatient level of care appropriate due to severity of illness  Dispo: The patient is from: Home  Anticipated d/c is to: Home              Patient currently is not medically stable to d/c.   Difficult to place patient No           Subjective:   Interval events noted.  No abdominal pain or vomiting.  He is tolerating full liquid diet.   Objective:    Vitals:   08/25/21 0027 08/25/21 0440 08/25/21 0807 08/25/21 1055  BP: (!) 104/51 (!) 108/55 101/64 112/70  Pulse: 80 82 96 82  Resp: 16 18 16 16   Temp: 98 F (36.7 C) 98.3 F (36.8 C) 98.1 F (36.7 C) 98 F (36.7 C)  TempSrc: Oral Oral Oral   SpO2: 92% 91% 92% 93%   Weight:      Height:       No data found.   Intake/Output Summary (Last 24 hours) at 08/25/2021 1311 Last data filed at 08/25/2021 0950 Gross per 24 hour  Intake 2591.36 ml  Output 901 ml  Net 1690.36 ml   Filed Weights   08/21/21 1953 08/22/21 2015  Weight: 59 kg 61 kg    Exam:  GEN: NAD SKIN: No rash EYES: EOMI ENT: MMM CV: RRR PULM: CTA B ABD: soft, ND, mild LLQ tenderness, +BS CNS: AAO x 3, non focal EXT: No edema or tenderness          Data Reviewed:   I have personally reviewed following labs and imaging studies:  Labs: Labs show the following:   Basic Metabolic Panel: Recent Labs  Lab 08/21/21 1959 08/22/21 0641 08/23/21 0512 08/24/21 0630 08/25/21 0413  NA 133* 137 140 136 134*  K 3.3* 3.2* 3.7 2.8* 3.5  CL 93* 100 106 102 102  CO2 27 27 30 27 27   GLUCOSE 188* 148* 129* 106* 103*  BUN 31* 29* 31* 21 15  CREATININE 1.23 1.11 1.17 1.02 0.94  CALCIUM 8.5* 8.4* 7.9* 7.6* 7.7*  MG  --   --  2.1  --   --    GFR Estimated Creatinine Clearance: 37.9 mL/min (by C-G formula based on SCr of 0.94 mg/dL). Liver Function Tests: Recent Labs  Lab 08/21/21 1959  AST 26  ALT 19  ALKPHOS 45  BILITOT 1.6*  PROT 6.3*  ALBUMIN 3.6   Recent Labs  Lab 08/21/21 1959  LIPASE 37   No results for input(s): AMMONIA in the last 168 hours. Coagulation profile No results for input(s): INR, PROTIME in the last 168 hours.  CBC: Recent Labs  Lab 08/21/21 1959 08/22/21 0641 08/24/21 0630 08/25/21 0413  WBC 29.5* 26.2* 14.0* 13.9*  NEUTROABS 27.4*  --  12.2* 12.0*  HGB 10.6* 10.8* 8.7* 9.4*  HCT 31.7* 33.8* 27.2* 28.9*  MCV 105.3* 104.3* 106.3* 107.0*  PLT 278 270 219 236   Cardiac Enzymes: No results for input(s): CKTOTAL, CKMB, CKMBINDEX, TROPONINI in the last 168 hours. BNP (last 3 results) No results for input(s): PROBNP in the last 8760 hours. CBG: No results for input(s): GLUCAP in the last 168 hours. D-Dimer: No results for  input(s): DDIMER in the last 72 hours. Hgb A1c: No results for input(s): HGBA1C in the last 72 hours. Lipid Profile: No results for input(s): CHOL, HDL, LDLCALC, TRIG, CHOLHDL, LDLDIRECT in the last 72 hours. Thyroid function studies: No results for input(s): TSH, T4TOTAL, T3FREE, THYROIDAB in the last 72 hours.  Invalid input(s): FREET3 Anemia work up: No results for input(s): VITAMINB12, FOLATE, FERRITIN, TIBC, IRON, RETICCTPCT in the  last 72 hours. Sepsis Labs: Recent Labs  Lab 08/21/21 1959 08/21/21 2233 08/22/21 0641 08/24/21 0630 08/25/21 0413  WBC 29.5*  --  26.2* 14.0* 13.9*  LATICACIDVEN  --  1.4 1.3  --   --     Microbiology Recent Results (from the past 240 hour(s))  Resp Panel by RT-PCR (Flu A&B, Covid) Nasopharyngeal Swab     Status: None   Collection Time: 08/21/21  7:59 PM   Specimen: Nasopharyngeal Swab; Nasopharyngeal(NP) swabs in vial transport medium  Result Value Ref Range Status   SARS Coronavirus 2 by RT PCR NEGATIVE NEGATIVE Final    Comment: (NOTE) SARS-CoV-2 target nucleic acids are NOT DETECTED.  The SARS-CoV-2 RNA is generally detectable in upper respiratory specimens during the acute phase of infection. The lowest concentration of SARS-CoV-2 viral copies this assay can detect is 138 copies/mL. A negative result does not preclude SARS-Cov-2 infection and should not be used as the sole basis for treatment or other patient management decisions. A negative result may occur with  improper specimen collection/handling, submission of specimen other than nasopharyngeal swab, presence of viral mutation(s) within the areas targeted by this assay, and inadequate number of viral copies(<138 copies/mL). A negative result must be combined with clinical observations, patient history, and epidemiological information. The expected result is Negative.  Fact Sheet for Patients:  EntrepreneurPulse.com.au  Fact Sheet for Healthcare Providers:   IncredibleEmployment.be  This test is no t yet approved or cleared by the [place] FDA and  has been authorized for detection and/or diagnosis of SARS-CoV-2 by FDA under an Emergency Use Authorization (EUA). This EUA will remain  in effect (meaning this test can be used) for the duration of the COVID-19 declaration under Section 564(b)(1) of the Act, 21 U.S.C.section 360bbb-3(b)(1), unless the authorization is terminated  or revoked sooner.       Influenza A by PCR NEGATIVE NEGATIVE Final   Influenza B by PCR NEGATIVE NEGATIVE Final    Comment: (NOTE) The Xpert Xpress SARS-CoV-2/FLU/RSV plus assay is intended as an aid in the diagnosis of influenza from Nasopharyngeal swab specimens and should not be used as a sole basis for treatment. Nasal washings and aspirates are unacceptable for Xpert Xpress SARS-CoV-2/FLU/RSV testing.  Fact Sheet for Patients: EntrepreneurPulse.com.au  Fact Sheet for Healthcare Providers: IncredibleEmployment.be  This test is not yet approved or cleared by the [place] FDA and has been authorized for detection and/or diagnosis of SARS-CoV-2 by FDA under an Emergency Use Authorization (EUA). This EUA will remain in effect (meaning this test can be used) for the duration of the COVID-19 declaration under Section 564(b)(1) of the Act, 21 U.S.C. section 360bbb-3(b)(1), unless the authorization is terminated or revoked.  Performed at [facility], [address], [place], [place] [address]   Urine Culture     Status: None   Collection Time: 08/21/21  7:59 PM   Specimen: Urine, Random  Result Value Ref Range Status   Specimen Description   Final    URINE, RANDOM Performed at [facility], [address] [place], [place] [address]    Special Requests   Final    NONE Performed at [facility], [address] [place], [place] [address]    Culture   Final     NO GROWTH Performed at [facility], [place] [address], [place], [place] [address]    Report Status 08/22/2021 FINAL  Final  Blood culture (routine x 2)     Status: None (  Preliminary result)   Collection Time: 08/21/21 10:34 PM   Specimen: BLOOD  Result Value Ref Range Status   Specimen Description BLOOD LEFT ASSIST CONTROL  Final   Special Requests   Final    BOTTLES DRAWN AEROBIC AND ANAEROBIC Blood Culture adequate volume   Culture   Final    NO GROWTH 4 DAYS Performed at [facility], [address] [place], [place] [address]    Report Status PENDING  Incomplete  Blood culture (routine x 2)     Status: None (Preliminary result)   Collection Time: 08/21/21 10:50 PM   Specimen: BLOOD  Result Value Ref Range Status   Specimen Description BLOOD RIGHT FOREARM  Final   Special Requests   Final    BOTTLES DRAWN AEROBIC AND ANAEROBIC Blood Culture results may not be optimal due to an inadequate volume of blood received in culture bottles   Culture   Final    NO GROWTH 4 DAYS Performed at [facility], [address], [place], [place] [address]    Report Status PENDING  Incomplete    Procedures and diagnostic studies:  No results found.             LOS: 4 days   Jermeka Schlotterbeck  [facility]   Pager on [email or web address]. If 7PM-7AM, please contact night-coverage at [email or web address]     08/25/2021, 1:11 PM

## 2021-08-25 NOTE — Progress Notes (Signed)
Pharmacy Antibiotic Note  Jason Powers is a 87 y.o. male admitted on 08/21/2021 with intra-abdominal infection.  Pharmacy has been consulted for Zosyn dosing.  Plan: Day 4 of abx. Zosyn 3.375g IV q8h (4 hour infusion). F/u with LOT.   Height: 5\' 7"  (170.2 cm) Weight: 61 kg (134 lb 7.7 oz) IBW/kg (Calculated) : 66.1  Temp (24hrs), Avg:98.1 F (36.7 C), Min:97.9 F (36.6 C), Max:98.5 F (36.9 C)  Recent Labs  Lab 08/21/21 1959 08/21/21 2233 08/22/21 0641 08/23/21 0512 08/24/21 0630 08/25/21 0413  WBC 29.5*  --  26.2*  --  14.0* 13.9*  CREATININE 1.23  --  1.11 1.17 1.02 0.94  LATICACIDVEN  --  1.4 1.3  --   --   --      Estimated Creatinine Clearance: 37.9 mL/min (by C-G formula based on SCr of 0.94 mg/dL).    No Known Allergies  Antimicrobials this admission:   >>    >>   Dose adjustments this admission:   Microbiology results:  10/10 BCx: pending  10/10 UCx: pending     Thank you for allowing pharmacy to be a part of this patient's care.  Oswald Hillock, PharmD, BCPS 08/25/2021 9:47 AM

## 2021-08-25 NOTE — Care Management Important Message (Signed)
Important Message  Patient Details  Name: Malcom Selmer MRN: [id number] Date of Birth: 04-09-1921   Medicare Important Message Given:  Yes     [name] 08/25/2021, 3:51 PM

## 2021-08-25 NOTE — TOC Initial Note (Addendum)
Transition of Care [facility]) - Initial/Assessment Note    Patient Details  Name: Jason Powers MRN: [id number] Date of Birth: 07-17-1921  Transition of Care [facility]) CM/SW Contact:    Jason Chroman, LCSW Phone Number: 08/25/2021, 3:00 PM  Clinical Narrative:   CSW met with patient. No supports at bedside. CSW introduced role and explained that PT recommendations would be discussed. Patient stated he was not strong enough to go to SNF. Explained that, that is what rehab is for but patient continued to declined. Patient gave CSW to contact his daughter Jason Powers but said it would not do any good. Left CMS scores for SNF's within 25 miles of his zip code in the room for daughters to review. Left voicemail for daughter. No further concerns. CSW encouraged patient to contact CSW as needed. CSW will continue to follow patient for support and facilitate discharge once medically stable and disposition venue determined.        4:39 pm: Received call back from daughter, Jason Powers. They will talk to patient about SNF and once he agrees, contact weekend Education officer, museum. First preference will likely be [facility].         Expected Discharge Plan: Skilled Nursing Facility Barriers to Discharge: Continued Medical Work up   Patient Goals and CMS Choice   CMS Medicare.gov Compare Post Acute Care list provided to:: Patient    Expected Discharge Plan and Services Expected Discharge Plan: [place] Acute Care Choice:  (TBD) Living arrangements for the past 2 months: Single Family Home                                      Prior Living Arrangements/Services Living arrangements for the past 2 months: Single Family Home Lives with:: Self Patient language and need for interpreter reviewed:: Yes Do you feel safe going back to the place where you live?: Yes      Need for Family Participation in Patient Care: Yes (Comment)   Current home services: DME Criminal Activity/Legal  Involvement Pertinent to Current Situation/Hospitalization: No - Comment as needed  Activities of Daily Living Home Assistive Devices/Equipment:  (4 wheels walker) ADL Screening (condition at time of admission) Patient's cognitive ability adequate to safely complete daily activities?: Yes Is the patient deaf or have difficulty hearing?: Yes Does the patient have difficulty seeing, even when wearing glasses/contacts?: No Does the patient have difficulty concentrating, remembering, or making decisions?: No Patient able to express need for assistance with ADLs?: Yes Does the patient have difficulty dressing or bathing?: No Independently performs ADLs?: Yes (appropriate for developmental age) Does the patient have difficulty walking or climbing stairs?: Yes Weakness of Legs: Both Weakness of Arms/Hands: None  Permission Sought/Granted Permission sought to share information with : Family Supports Permission granted to share information with : Yes, Verbal Permission Granted  Share Information with NAME: Jason Powers     Permission granted to share info w Relationship: Daughter  Permission granted to share info w Contact Information: [phone]  Emotional Assessment Appearance:: Appears stated age Attitude/Demeanor/Rapport: Engaged Affect (typically observed): Calm, Appropriate Orientation: : Oriented to Self, Oriented to Place, Oriented to  Time, Oriented to Situation Alcohol / Substance Use: Not Applicable Psych Involvement: No (comment)  Admission diagnosis:  Diverticulitis [K57.92] Fall [W19.XXXA] Hypotension [I95.9] Sepsis, due to unspecified organism, unspecified whether acute organ dysfunction present [facility]) [A41.9] Patient Active Problem List  Diagnosis Date Noted   Hypokalemia 08/22/2021   Cervical stenosis of spinal canal 08/22/2021   Sepsis ([place]) 08/22/2021   Hypotension 08/21/2021   Diverticulitis 08/14/2021   Acute blood loss anemia 08/10/2021   Acute blood loss  anemia ([facility]) 08/10/2021   Hypertension    COPD (chronic obstructive pulmonary disease) ([place])    Sigmoid diverticulitis    Macrocytic anemia 07/18/2021   Benign prostatic hyperplasia (BPH) with urinary urge incontinence 07/18/2021   Compression fracture of thoracic vertebra ([place]) 06/16/2021   Constipation 06/16/2021   Weight loss 06/16/2021   Bilateral leg weakness 08/30/2020   Knee pain, chronic 07/18/2020   Iron deficiency anemia 03/02/2020   Fluid retention in legs 02/27/2020   Fatigue 02/27/2020   PCP:  Crecencio Mc, MD Pharmacy:   [place], [place] [address] Phone: [phone] Fax: [phone]  [id number] [place], [place]: [phone]: [address]Phone: [phone]: [phone] [id number] [place] [address]: [phone]: [phone]    Social Determinants of Health ([place] Interventions    Readmission Risk Interventions No flowsheet data found.

## 2021-08-26 DIAGNOSIS — E876 Hypokalemia: Secondary | ICD-10-CM | POA: Diagnosis not present

## 2021-08-26 DIAGNOSIS — I9589 Other hypotension: Secondary | ICD-10-CM | POA: Diagnosis not present

## 2021-08-26 DIAGNOSIS — K5792 Diverticulitis of intestine, part unspecified, without perforation or abscess without bleeding: Secondary | ICD-10-CM | POA: Diagnosis not present

## 2021-08-26 DIAGNOSIS — A419 Sepsis, unspecified organism: Secondary | ICD-10-CM | POA: Diagnosis not present

## 2021-08-26 LAB — CBC WITH DIFFERENTIAL/PLATELET
Abs Immature Granulocytes: 0.18 10*3/uL — ABNORMAL HIGH (ref 0.00–0.07)
Basophils Absolute: 0.1 10*3/uL (ref 0.0–0.1)
Basophils Relative: 0 %
Eosinophils Absolute: 0.4 10*3/uL (ref 0.0–0.5)
Eosinophils Relative: 2 %
HCT: 30.2 % — ABNORMAL LOW (ref 39.0–52.0)
Hemoglobin: 9.9 g/dL — ABNORMAL LOW (ref 13.0–17.0)
Immature Granulocytes: 1 %
Lymphocytes Relative: 3 %
Lymphs Abs: 0.6 10*3/uL — ABNORMAL LOW (ref 0.7–4.0)
MCH: 35.2 pg — ABNORMAL HIGH (ref 26.0–34.0)
MCHC: 32.8 g/dL (ref 30.0–36.0)
MCV: 107.5 fL — ABNORMAL HIGH (ref 80.0–100.0)
Monocytes Absolute: 1 10*3/uL (ref 0.1–1.0)
Monocytes Relative: 6 %
Neutro Abs: 14.4 10*3/uL — ABNORMAL HIGH (ref 1.7–7.7)
Neutrophils Relative %: 88 %
Platelets: 216 10*3/uL (ref 150–400)
RBC: 2.81 MIL/uL — ABNORMAL LOW (ref 4.22–5.81)
RDW: 18 % — ABNORMAL HIGH (ref 11.5–15.5)
WBC: 16.6 10*3/uL — ABNORMAL HIGH (ref 4.0–10.5)
nRBC: 0 % (ref 0.0–0.2)

## 2021-08-26 LAB — CULTURE, BLOOD (ROUTINE X 2)
Culture: NO GROWTH
Culture: NO GROWTH
Special Requests: ADEQUATE

## 2021-08-26 LAB — BASIC METABOLIC PANEL
Anion gap: 5 (ref 5–15)
BUN: 16 mg/dL (ref 8–23)
CO2: 31 mmol/L (ref 22–32)
Calcium: 7.8 mg/dL — ABNORMAL LOW (ref 8.9–10.3)
Chloride: 100 mmol/L (ref 98–111)
Creatinine, Ser: 0.87 mg/dL (ref 0.61–1.24)
GFR, Estimated: >60 mL/min (ref >60)
Glucose, Bld: 123 mg/dL — ABNORMAL HIGH (ref 70–99)
Potassium: 3.5 mmol/L (ref 3.5–5.1)
Sodium: 136 mmol/L (ref 135–145)

## 2021-08-26 MED ORDER — POTASSIUM CHLORIDE CRYS ER 20 MEQ PO TBCR
40.0000 meq | EXTENDED_RELEASE_TABLET | Freq: Once | ORAL | Status: AC
  Administered 2021-08-26: 40 meq via ORAL
  Filled 2021-08-26: qty 2

## 2021-08-26 MED ORDER — SALINE SPRAY 0.65 % NA SOLN
1.0000 | NASAL | Status: DC | PRN
  Administered 2021-08-26: 1 via NASAL
  Filled 2021-08-26: qty 44

## 2021-08-26 NOTE — NC FL2 (Signed)
[place] LEVEL OF CARE SCREENING TOOL     IDENTIFICATION  Patient Name: Jason Powers Birthdate: 1921/04/27 Sex: male Admission Date (Current Location): 08/21/2021  [facility] and [place] Number:  Engineering geologist and Address:  [facility], [address] [place], [place] [address]      Provider Number: [id number]  Attending Physician Name and Address:  Jennye Boroughs, MD  Relative Name and Phone Number:  [name] (daughter) [phone]    Current Level of Care: Hospital Recommended Level of Care: [place] Prior Approval Number:    Date Approved/Denied:   PASRR Number:    Discharge Plan: SNF    Current Diagnoses: Patient Active Problem List   Diagnosis Date Noted   Hypokalemia 08/22/2021   Cervical stenosis of spinal canal 08/22/2021   Sepsis ([place]) 08/22/2021   Hypotension 08/21/2021   Diverticulitis 08/14/2021   Acute blood loss anemia 08/10/2021   Acute blood loss anemia ([facility]) 08/10/2021   Hypertension    COPD (chronic obstructive pulmonary disease) ([place])    Sigmoid diverticulitis    Macrocytic anemia 07/18/2021   Benign prostatic hyperplasia (BPH) with urinary urge incontinence 07/18/2021   Compression fracture of thoracic vertebra (HCC) 06/16/2021   Constipation 06/16/2021   Weight loss 06/16/2021   Bilateral leg weakness 08/30/2020   Knee pain, chronic 07/18/2020   Iron deficiency anemia 03/02/2020   Fluid retention in legs 02/27/2020   Fatigue 02/27/2020    Orientation RESPIRATION BLADDER Height & Weight     Self, Time, Situation, Place  Normal Incontinent, External catheter Weight: 134 lb 7.7 oz (61 kg) Height:  5\' 7"  (170.2 cm)  BEHAVIORAL SYMPTOMS/MOOD NEUROLOGICAL BOWEL NUTRITION STATUS      Incontinent Diet (see discharge summary)  AMBULATORY STATUS COMMUNICATION OF NEEDS Skin   Limited Assist Verbally Normal                       Personal Care Assistance Level of Assistance   Bathing, Feeding, Dressing, Total care Bathing Assistance: Limited assistance Feeding assistance: Independent Dressing Assistance: Limited assistance Total Care Assistance: Limited assistance   Functional Limitations Info  Sight, Hearing, Speech Sight Info: Impaired Hearing Info: Adequate Speech Info: Adequate    SPECIAL CARE FACTORS FREQUENCY  PT (By licensed PT), OT (By licensed OT)     PT Frequency: min 4x weekly OT Frequency: min 4x weekly            Contractures Contractures Info: Not present    Additional Factors Info  Code Status, Allergies Code Status Info: DNR Allergies Info: No Known Allergies           Current Medications (08/26/2021):  This is the current hospital active medication list Current Facility-Administered Medications  Medication Dose Route Frequency Provider Last Rate Last Admin   0.9 %  sodium chloride infusion   Intravenous PRN Jennye Boroughs, MD   Stopped at 08/24/21 1327   acetaminophen (TYLENOL) tablet 650 mg  650 mg Oral Q6H PRN Sharion Settler, NP   650 mg at 08/22/21 2201   Chlorhexidine Gluconate Cloth 2 % PADS 6 each  6 each Topical Daily Tu, Ching T, DO   6 each at 08/26/21 0816   enoxaparin (LOVENOX) injection 40 mg  40 mg Subcutaneous Q24H Sharen Hones, MD   40 mg at 08/26/21 7371   Ferrous Fumarate (HEMOCYTE - 106 mg FE) tablet 106 mg of iron  1 tablet Oral Q breakfast Sharen Hones, MD   106  mg of iron at 08/26/21 0815   piperacillin-tazobactam (ZOSYN) IVPB 3.375 g  3.375 g Intravenous Allie Dimmer, MD 12.5 mL/hr at 08/26/21 0543 3.375 g at 08/26/21 0543   tamsulosin (FLOMAX) capsule 0.4 mg  0.4 mg Oral Daily Tu, Ching T, DO   0.4 mg at 08/26/21 5597     Discharge Medications: Please see discharge summary for a list of discharge medications.  Relevant Imaging Results:  Relevant Lab Results:   Additional Information SSN: [id number]  Alberteen Sam, LCSW

## 2021-08-26 NOTE — Progress Notes (Signed)
Progress Note    Jason Powers  LGX:[id number] DOB: [birth year]  DOA: 08/21/2021 PCP: Crecencio Mc, MD      Brief Narrative:    Medical records reviewed and are as summarized below:  Jason Powers is a 87 y.o. male with medical history significant for recent discharge from the hospital on 08/11/2021 after treatment for acute sigmoid diverticulitis and acute on chronic anemia secondary to diverticular bleed (required blood transfusion) COPD, hypertension, deficiency anemia, BPH, thoracic compression fracture.  He was discharged on a 5-day course of Flagyl.  He presented to the hospital after a fall at home.  He said he was sitting on the toilet when he slid and fell.  He was found to have sepsis secondary to acute sigmoid diverticulitis.    Assessment/Plan:   Principal Problem:   Hypotension Active Problems:   Diverticulitis   Hypokalemia   Cervical stenosis of spinal canal   Sepsis (HCC)    Body mass index is 21.06 kg/m.   Sepsis secondary to acute sigmoid diverticulitis, leukocytosis: Leukocytosis is worsening again.  Continue IV Zosyn for now.   Analgesics as needed for pain.  Of note, patient was recently discharged on 08/11/2021 after hospitalization for acute diverticulitis and diverticular bleed.  Hypotension: Improved  Hypokalemia: Improved.  Continue potassium repletion.  Diarrhea: Improved  S/p mechanical fall, cervical spinal stenosis, old T9 and T12 compression fractures: PT evaluation.  CKD stage IIIa: Creatinine is stable.  No evidence of AKI.  BPH with urinary retention: He has been able to pass urine spontaneously since removal of Foley catheter on 08/25/2021.  Continue Flomax.   Debility, unsteady gait: PT recommends discharge to SNF.  Other comorbidities include COPD, hypertension, iron deficiency anemia    Diet Order             Diet regular Room service appropriate? Yes; Fluid consistency: Thin  Diet effective now                       Consultants: None  Procedures: None    Medications:    Chlorhexidine Gluconate Cloth  6 each Topical Daily   enoxaparin (LOVENOX) injection  40 mg Subcutaneous Q24H   Ferrous Fumarate  1 tablet Oral Q breakfast   tamsulosin  0.4 mg Oral Daily   Continuous Infusions:  sodium chloride Stopped (08/24/21 1327)   piperacillin-tazobactam (ZOSYN)  IV 3.375 g (08/26/21 1444)     Anti-infectives (From admission, onward)    Start     Dose/Rate Route Frequency Ordered Stop   08/22/21 0600  piperacillin-tazobactam (ZOSYN) IVPB 3.375 g        3.375 g 12.5 mL/hr over 240 Minutes Intravenous Every 8 hours 08/22/21 0215 09/05/21 0559   08/21/21 2245  piperacillin-tazobactam (ZOSYN) IVPB 3.375 g        3.375 g 100 mL/hr over 30 Minutes Intravenous  Once 08/21/21 2235 08/21/21 2333              Family Communication/Anticipated D/C date and plan/Code Status   DVT prophylaxis: enoxaparin (LOVENOX) injection 40 mg Start: 08/23/21 0800     Code Status: DNR  Family Communication: None Disposition Plan:    Status is: Inpatient  Remains inpatient appropriate because:IV treatments appropriate due to intensity of illness or inability to take PO and Inpatient level of care appropriate due to severity of illness  Dispo: The patient is from: Home  Anticipated d/c is to: Home              Patient currently is not medically stable to d/c.   Difficult to place patient No           Subjective:   He complains of generalized weakness.  He said he had 1 large stool this morning.  No abdominal pain or vomiting.  He is tolerating his diet.   Objective:    Vitals:   08/26/21 0820 08/26/21 1000 08/26/21 1140 08/26/21 1600  BP: (!) 102/53  (!) 102/56 (!) 104/53  Pulse: 73  82 (!) 110  Resp:  20 18 18   Temp:   97.7 F (36.5 C) 99.1 F (37.3 C)  TempSrc:      SpO2: 99%  93% 93%  Weight:      Height:       No data found.   Intake/Output  Summary (Last 24 hours) at 08/26/2021 1731 Last data filed at 08/25/2021 1820 Gross per 24 hour  Intake 240 ml  Output --  Net 240 ml   Filed Weights   08/21/21 1953 08/22/21 2015  Weight: 59 kg 61 kg    Exam:  GEN: NAD SKIN: Warm and dry EYES: No pallor or icterus ENT: MMM CV: RRR PULM: CTA B ABD: soft, ND, mild LLQ tenderness, no rebound tenderness or guarding, +BS CNS: AAO x 3, non focal EXT: No edema or tenderness           Data Reviewed:   I have personally reviewed following labs and imaging studies:  Labs: Labs show the following:   Basic Metabolic Panel: Recent Labs  Lab 08/22/21 0641 08/23/21 0512 08/24/21 0630 08/25/21 0413 08/26/21 0643  NA 137 140 136 134* 136  K 3.2* 3.7 2.8* 3.5 3.5  CL 100 106 102 102 100  CO2 27 30 27 27 31   GLUCOSE 148* 129* 106* 103* 123*  BUN 29* 31* 21 15 16   CREATININE 1.11 1.17 1.02 0.94 0.87  CALCIUM 8.4* 7.9* 7.6* 7.7* 7.8*  MG  --  2.1  --   --   --    GFR Estimated Creatinine Clearance: 40.9 mL/min (by C-G formula based on SCr of 0.87 mg/dL). Liver Function Tests: Recent Labs  Lab 08/21/21 1959  AST 26  ALT 19  ALKPHOS 45  BILITOT 1.6*  PROT 6.3*  ALBUMIN 3.6   Recent Labs  Lab 08/21/21 1959  LIPASE 37   No results for input(s): AMMONIA in the last 168 hours. Coagulation profile No results for input(s): INR, PROTIME in the last 168 hours.  CBC: Recent Labs  Lab 08/21/21 1959 08/22/21 0641 08/24/21 0630 08/25/21 0413 08/26/21 0643  WBC 29.5* 26.2* 14.0* 13.9* 16.6*  NEUTROABS 27.4*  --  12.2* 12.0* 14.4*  HGB 10.6* 10.8* 8.7* 9.4* 9.9*  HCT 31.7* 33.8* 27.2* 28.9* 30.2*  MCV 105.3* 104.3* 106.3* 107.0* 107.5*  PLT 278 270 219 236 216   Cardiac Enzymes: No results for input(s): CKTOTAL, CKMB, CKMBINDEX, TROPONINI in the last 168 hours. BNP (last 3 results) No results for input(s): PROBNP in the last 8760 hours. CBG: No results for input(s): GLUCAP in the last 168  hours. D-Dimer: No results for input(s): DDIMER in the last 72 hours. Hgb A1c: No results for input(s): HGBA1C in the last 72 hours. Lipid Profile: No results for input(s): CHOL, HDL, LDLCALC, TRIG, CHOLHDL, LDLDIRECT in the last 72 hours. Thyroid function studies: No results for input(s): TSH, T4TOTAL, T3FREE, THYROIDAB  in the last 72 hours.  Invalid input(s): FREET3 Anemia work up: No results for input(s): VITAMINB12, FOLATE, FERRITIN, TIBC, IRON, RETICCTPCT in the last 72 hours. Sepsis Labs: Recent Labs  Lab 08/21/21 2233 08/22/21 0641 08/24/21 0630 08/25/21 0413 08/26/21 0643  WBC  --  26.2* 14.0* 13.9* 16.6*  LATICACIDVEN 1.4 1.3  --   --   --     Microbiology Recent Results (from the past 240 hour(s))  Resp Panel by RT-PCR (Flu A&B, Covid) Nasopharyngeal Swab     Status: None   Collection Time: 08/21/21  7:59 PM   Specimen: Nasopharyngeal Swab; Nasopharyngeal(NP) swabs in vial transport medium  Result Value Ref Range Status   SARS Coronavirus 2 by RT PCR NEGATIVE NEGATIVE Final    Comment: (NOTE) SARS-CoV-2 target nucleic acids are NOT DETECTED.  The SARS-CoV-2 RNA is generally detectable in upper respiratory specimens during the acute phase of infection. The lowest concentration of SARS-CoV-2 viral copies this assay can detect is 138 copies/mL. A negative result does not preclude SARS-Cov-2 infection and should not be used as the sole basis for treatment or other patient management decisions. A negative result may occur with  improper specimen collection/handling, submission of specimen other than nasopharyngeal swab, presence of viral mutation(s) within the areas targeted by this assay, and inadequate number of viral copies(<138 copies/mL). A negative result must be combined with clinical observations, patient history, and epidemiological information. The expected result is Negative.  Fact Sheet for Patients:  EntrepreneurPulse.com.au  Fact  Sheet for Healthcare Providers:  IncredibleEmployment.be  This test is no t yet approved or cleared by the [place] FDA and  has been authorized for detection and/or diagnosis of SARS-CoV-2 by FDA under an Emergency Use Authorization (EUA). This EUA will remain  in effect (meaning this test can be used) for the duration of the COVID-19 declaration under Section 564(b)(1) of the Act, 21 U.S.C.section 360bbb-3(b)(1), unless the authorization is terminated  or revoked sooner.       Influenza A by PCR NEGATIVE NEGATIVE Final   Influenza B by PCR NEGATIVE NEGATIVE Final    Comment: (NOTE) The Xpert Xpress SARS-CoV-2/FLU/RSV plus assay is intended as an aid in the diagnosis of influenza from Nasopharyngeal swab specimens and should not be used as a sole basis for treatment. Nasal washings and aspirates are unacceptable for Xpert Xpress SARS-CoV-2/FLU/RSV testing.  Fact Sheet for Patients: EntrepreneurPulse.com.au  Fact Sheet for Healthcare Providers: IncredibleEmployment.be  This test is not yet approved or cleared by the [place] FDA and has been authorized for detection and/or diagnosis of SARS-CoV-2 by FDA under an Emergency Use Authorization (EUA). This EUA will remain in effect (meaning this test can be used) for the duration of the COVID-19 declaration under Section 564(b)(1) of the Act, 21 U.S.C. section 360bbb-3(b)(1), unless the authorization is terminated or revoked.  Performed at [facility], [address], [place], [place] [address]   Urine Culture     Status: None   Collection Time: 08/21/21  7:59 PM   Specimen: Urine, Random  Result Value Ref Range Status   Specimen Description   Final    URINE, RANDOM Performed at [facility], [address], [place], [place] [address]    Special Requests   Final    NONE Performed at [facility], [address], [place],  [place] [address]    Culture   Final    NO GROWTH Performed at [facility], [place] [address]  [place], [place] [address]    Report Status 08/22/2021 FINAL  Final  Blood culture (routine x 2)     Status: None   Collection Time: 08/21/21 10:34 PM   Specimen: BLOOD  Result Value Ref Range Status   Specimen Description BLOOD LEFT ASSIST CONTROL  Final   Special Requests   Final    BOTTLES DRAWN AEROBIC AND ANAEROBIC Blood Culture adequate volume   Culture   Final    NO GROWTH 5 DAYS Performed at [facility], [address] [place], [place] [address]    Report Status 08/26/2021 FINAL  Final  Blood culture (routine x 2)     Status: None   Collection Time: 08/21/21 10:50 PM   Specimen: BLOOD  Result Value Ref Range Status   Specimen Description BLOOD RIGHT FOREARM  Final   Special Requests   Final    BOTTLES DRAWN AEROBIC AND ANAEROBIC Blood Culture results may not be optimal due to an inadequate volume of blood received in culture bottles   Culture   Final    NO GROWTH 5 DAYS Performed at [facility], [address], [place], [place] [address]    Report Status 08/26/2021 FINAL  Final    Procedures and diagnostic studies:  No results found.             LOS: 5 days   Marybelle Giraldo  [facility]   Pager on [email or web address]. If 7PM-7AM, please contact night-coverage at [email or web address]     08/26/2021, 5:31 PM

## 2021-08-26 NOTE — TOC Progression Note (Signed)
Transition of Care [facility]) - Progression Note    Patient Details  Name: Harman Langhans MRN: [id number] Date of Birth: 03/25/1921  Transition of Care [facility]) CM/SW [place], LCSW Phone Number: 08/26/2021, 11:51 AM  Clinical Narrative:   Call from daughter [name] who states patient has agreed to short term rehab. They prefer [facility]. They are ok with looking into other options in this area. Explained SNF process and workup will be started.    Expected Discharge Plan: [place] Barriers to Discharge: Continued Medical Work up  Expected Discharge Plan and Services Expected Discharge Plan: [place] Acute Care Choice:  (TBD) Living arrangements for the past 2 months: Single Family Home                                       Social Determinants of Health (SDOH) Interventions    Readmission Risk Interventions No flowsheet data found.

## 2021-08-26 NOTE — Progress Notes (Signed)
Mobility Specialist - Progress Note   08/26/21 1500  Mobility  Activity Refused mobility  Mobility performed by Mobility specialist    Pt declined mobility, no reason specified. Pt reports he's feeling too weak for activity at this time. Encouraged bed-level therex to promote strengthening, but pt continued to decline. Will attempt session another date.    Kathee Delton Mobility Specialist 08/26/21, 3:18 PM

## 2021-08-27 ENCOUNTER — Other Ambulatory Visit: Payer: Self-pay

## 2021-08-27 DIAGNOSIS — E861 Hypovolemia: Secondary | ICD-10-CM | POA: Diagnosis not present

## 2021-08-27 DIAGNOSIS — A419 Sepsis, unspecified organism: Secondary | ICD-10-CM | POA: Diagnosis not present

## 2021-08-27 DIAGNOSIS — E876 Hypokalemia: Secondary | ICD-10-CM | POA: Diagnosis not present

## 2021-08-27 DIAGNOSIS — I9589 Other hypotension: Secondary | ICD-10-CM | POA: Diagnosis not present

## 2021-08-27 LAB — CBC WITH DIFFERENTIAL/PLATELET
Abs Immature Granulocytes: 0.38 10*3/uL — ABNORMAL HIGH (ref 0.00–0.07)
Basophils Absolute: 0.1 10*3/uL (ref 0.0–0.1)
Basophils Relative: 0 %
Eosinophils Absolute: 0.5 10*3/uL (ref 0.0–0.5)
Eosinophils Relative: 3 %
HCT: 32.1 % — ABNORMAL LOW (ref 39.0–52.0)
Hemoglobin: 10.3 g/dL — ABNORMAL LOW (ref 13.0–17.0)
Immature Granulocytes: 3 %
Lymphocytes Relative: 5 %
Lymphs Abs: 0.7 10*3/uL (ref 0.7–4.0)
MCH: 34.4 pg — ABNORMAL HIGH (ref 26.0–34.0)
MCHC: 32.1 g/dL (ref 30.0–36.0)
MCV: 107.4 fL — ABNORMAL HIGH (ref 80.0–100.0)
Monocytes Absolute: 1.1 10*3/uL — ABNORMAL HIGH (ref 0.1–1.0)
Monocytes Relative: 7 %
Neutro Abs: 12.8 10*3/uL — ABNORMAL HIGH (ref 1.7–7.7)
Neutrophils Relative %: 82 %
Platelets: 234 10*3/uL (ref 150–400)
RBC: 2.99 MIL/uL — ABNORMAL LOW (ref 4.22–5.81)
RDW: 18.3 % — ABNORMAL HIGH (ref 11.5–15.5)
WBC: 15.5 10*3/uL — ABNORMAL HIGH (ref 4.0–10.5)
nRBC: 0 % (ref 0.0–0.2)

## 2021-08-27 LAB — BASIC METABOLIC PANEL
Anion gap: 3 — ABNORMAL LOW (ref 5–15)
BUN: 18 mg/dL (ref 8–23)
CO2: 29 mmol/L (ref 22–32)
Calcium: 7.6 mg/dL — ABNORMAL LOW (ref 8.9–10.3)
Chloride: 101 mmol/L (ref 98–111)
Creatinine, Ser: 0.98 mg/dL (ref 0.61–1.24)
GFR, Estimated: >60 mL/min (ref >60)
Glucose, Bld: 120 mg/dL — ABNORMAL HIGH (ref 70–99)
Potassium: 4.6 mmol/L (ref 3.5–5.1)
Sodium: 133 mmol/L — ABNORMAL LOW (ref 135–145)

## 2021-08-27 LAB — MAGNESIUM: Magnesium: 1.7 mg/dL (ref 1.7–2.4)

## 2021-08-27 MED ORDER — SODIUM CHLORIDE 0.9 % IV BOLUS
250.0000 mL | Freq: Once | INTRAVENOUS | Status: AC
Start: 2021-08-28 — End: 2021-08-27
  Administered 2021-08-27: 250 mL via INTRAVENOUS

## 2021-08-27 MED ORDER — FLUTICASONE PROPIONATE 50 MCG/ACT NA SUSP
1.0000 | Freq: Every day | NASAL | Status: DC
  Administered 2021-08-27 – 2021-09-01 (×6): 1 via NASAL
  Filled 2021-08-27: qty 16

## 2021-08-27 MED ORDER — AMOXICILLIN-POT CLAVULANATE 875-125 MG PO TABS
1.0000 | ORAL_TABLET | Freq: Two times a day (BID) | ORAL | Status: DC
  Administered 2021-08-27 – 2021-08-29 (×5): 1 via ORAL
  Filled 2021-08-27 (×5): qty 1

## 2021-08-27 NOTE — Progress Notes (Signed)
Spoke with MD on the phone about patient's assessment, ordered 250 ml IVF NS bolus. Bolus started. Will continue to monitor.   Detrick Dani DNP 2321 PM

## 2021-08-27 NOTE — Progress Notes (Signed)
Progress Note    Chanan Detwiler  WRU:[id number] DOB: 06-Aug-1921  DOA: 08/21/2021 PCP: Crecencio Mc, MD      Brief Narrative:    Medical records reviewed and are as summarized below:  Jason Powers is a 87 y.o. male with medical history significant for recent discharge from the hospital on 08/11/2021 after treatment for acute sigmoid diverticulitis and acute on chronic anemia secondary to diverticular bleed (required blood transfusion) COPD, hypertension, deficiency anemia, BPH, thoracic compression fracture.  He was discharged on a 5-day course of Flagyl.  He presented to the hospital after a fall at home.  He said he was sitting on the toilet when he slid and fell.  He was found to have sepsis secondary to acute sigmoid diverticulitis.    Assessment/Plan:   Principal Problem:   Hypotension Active Problems:   Diverticulitis   Hypokalemia   Cervical stenosis of spinal canal   Sepsis (HCC)    Body mass index is 21.06 kg/m.   Sepsis secondary to acute sigmoid diverticulitis, leukocytosis: WBC is trending down.  Change IV Zosyn to Augmentin. Of note, patient was recently discharged on 08/11/2021 after hospitalization for acute diverticulitis and diverticular bleed.  Hypotension: BP is low but he is asymptomatic. Continue to monitor.  Tachycardia, s/p SVT: Heart rate is better.  Obtain twelve-lead EKG for further evaluation.  Hypokalemia: Improved  Dark stools: Of note, patient is on iron pills.  Monitor H&H.  S/p mechanical fall, cervical spinal stenosis, old T9 and T12 compression fractures: PT evaluation.  CKD stage IIIa: Creatinine is stable.  No evidence of AKI.  BPH with urinary retention: He has been able to pass urine spontaneously since removal of Foley catheter on 08/25/2021.  Continue Flomax.   Debility, unsteady gait: PT recommends discharge to SNF.  Other comorbidities include COPD, hypertension, iron deficiency anemia    Diet Order              Diet regular Room service appropriate? Yes; Fluid consistency: Thin  Diet effective now                      Consultants: None  Procedures: None    Medications:    amoxicillin-clavulanate  1 tablet Oral Q12H   Chlorhexidine Gluconate Cloth  6 each Topical Daily   enoxaparin (LOVENOX) injection  40 mg Subcutaneous Q24H   Ferrous Fumarate  1 tablet Oral Q breakfast   fluticasone  1 spray Each Nare Daily   tamsulosin  0.4 mg Oral Daily   Continuous Infusions:  sodium chloride Stopped (08/24/21 1327)     Anti-infectives (From admission, onward)    Start     Dose/Rate Route Frequency Ordered Stop   08/27/21 1000  amoxicillin-clavulanate (AUGMENTIN) 875-125 MG per tablet 1 tablet        1 tablet Oral Every 12 hours 08/27/21 0753     08/22/21 0600  piperacillin-tazobactam (ZOSYN) IVPB 3.375 g  Status:  Discontinued        3.375 g 12.5 mL/hr over 240 Minutes Intravenous Every 8 hours 08/22/21 0215 08/27/21 0753   08/21/21 2245  piperacillin-tazobactam (ZOSYN) IVPB 3.375 g        3.375 g 100 mL/hr over 30 Minutes Intravenous  Once 08/21/21 2235 08/21/21 2333              Family Communication/Anticipated D/C date and plan/Code Status   DVT prophylaxis: enoxaparin (LOVENOX) injection 40 mg Start: 08/23/21 0800  Code Status: DNR  Family Communication: None Disposition Plan:    Status is: Inpatient  Remains inpatient appropriate because:IV treatments appropriate due to intensity of illness or inability to take PO and Inpatient level of care appropriate due to severity of illness  Dispo: The patient is from: Home              Anticipated d/c is to: Home              Patient currently is not medically stable to d/c.   Difficult to place patient No           Subjective:   Interval events noted.  No abdominal pain, nausea or vomiting   Objective:    Vitals:   08/26/21 2348 08/27/21 0431 08/27/21 0731 08/27/21 1223  BP: 104/62  105/60 110/65 (!) 95/48  Pulse: 89 84 84 88  Resp: (!) 25 (!) 25 18 20   Temp: (!) 97.5 F (36.4 C) 98.1 F (36.7 C) 98.2 F (36.8 C) 98.3 F (36.8 C)  TempSrc: Oral  Oral Oral  SpO2: 91% 95% 92% 91%  Weight:      Height:       No data found.   Intake/Output Summary (Last 24 hours) at 08/27/2021 1320 Last data filed at 08/27/2021 1030 Gross per 24 hour  Intake 0 ml  Output 1550 ml  Net -1550 ml   Filed Weights   08/21/21 1953 08/22/21 2015  Weight: 59 kg 61 kg    Exam:  GEN: NAD SKIN: Warm and dry EYES: EOMI ENT: MMM CV: RRR PULM: CTA B ABD: soft, ND, NT, +BS CNS: AAO x 3, non focal EXT: No edema or tenderness              Data Reviewed:   I have personally reviewed following labs and imaging studies:  Labs: Labs show the following:   Basic Metabolic Panel: Recent Labs  Lab 08/23/21 0512 08/24/21 0630 08/25/21 0413 08/26/21 0643 08/27/21 0604  NA 140 136 134* 136 133*  K 3.7 2.8* 3.5 3.5 4.6  CL 106 102 102 100 101  CO2 30 27 27 31 29   GLUCOSE 129* 106* 103* 123* 120*  BUN 31* 21 15 16 18   CREATININE 1.17 1.02 0.94 0.87 0.98  CALCIUM 7.9* 7.6* 7.7* 7.8* 7.6*  MG 2.1  --   --   --  1.7   GFR Estimated Creatinine Clearance: 36.3 mL/min (by C-G formula based on SCr of 0.98 mg/dL). Liver Function Tests: Recent Labs  Lab 08/21/21 1959  AST 26  ALT 19  ALKPHOS 45  BILITOT 1.6*  PROT 6.3*  ALBUMIN 3.6   Recent Labs  Lab 08/21/21 1959  LIPASE 37   No results for input(s): AMMONIA in the last 168 hours. Coagulation profile No results for input(s): INR, PROTIME in the last 168 hours.  CBC: Recent Labs  Lab 08/21/21 1959 08/22/21 0641 08/24/21 0630 08/25/21 0413 08/26/21 0643 08/27/21 0604  WBC 29.5* 26.2* 14.0* 13.9* 16.6* 15.5*  NEUTROABS 27.4*  --  12.2* 12.0* 14.4* 12.8*  HGB 10.6* 10.8* 8.7* 9.4* 9.9* 10.3*  HCT 31.7* 33.8* 27.2* 28.9* 30.2* 32.1*  MCV 105.3* 104.3* 106.3* 107.0* 107.5* 107.4*  PLT 278 270 219  236 216 234   Cardiac Enzymes: No results for input(s): CKTOTAL, CKMB, CKMBINDEX, TROPONINI in the last 168 hours. BNP (last 3 results) No results for input(s): PROBNP in the last 8760 hours. CBG: No results for input(s): GLUCAP in the last 168  hours. D-Dimer: No results for input(s): DDIMER in the last 72 hours. Hgb A1c: No results for input(s): HGBA1C in the last 72 hours. Lipid Profile: No results for input(s): CHOL, HDL, LDLCALC, TRIG, CHOLHDL, LDLDIRECT in the last 72 hours. Thyroid function studies: No results for input(s): TSH, T4TOTAL, T3FREE, THYROIDAB in the last 72 hours.  Invalid input(s): FREET3 Anemia work up: No results for input(s): VITAMINB12, FOLATE, FERRITIN, TIBC, IRON, RETICCTPCT in the last 72 hours. Sepsis Labs: Recent Labs  Lab 08/21/21 2233 08/22/21 0641 08/24/21 0630 08/25/21 0413 08/26/21 0643 08/27/21 0604  WBC  --  26.2* 14.0* 13.9* 16.6* 15.5*  LATICACIDVEN 1.4 1.3  --   --   --   --     Microbiology Recent Results (from the past 240 hour(s))  Resp Panel by RT-PCR (Flu A&B, Covid) Nasopharyngeal Swab     Status: None   Collection Time: 08/21/21  7:59 PM   Specimen: Nasopharyngeal Swab; Nasopharyngeal(NP) swabs in vial transport medium  Result Value Ref Range Status   SARS Coronavirus 2 by RT PCR NEGATIVE NEGATIVE Final    Comment: (NOTE) SARS-CoV-2 target nucleic acids are NOT DETECTED.  The SARS-CoV-2 RNA is generally detectable in upper respiratory specimens during the acute phase of infection. The lowest concentration of SARS-CoV-2 viral copies this assay can detect is 138 copies/mL. A negative result does not preclude SARS-Cov-2 infection and should not be used as the sole basis for treatment or other patient management decisions. A negative result may occur with  improper specimen collection/handling, submission of specimen other than nasopharyngeal swab, presence of viral mutation(s) within the areas targeted by this assay, and  inadequate number of viral copies(<138 copies/mL). A negative result must be combined with clinical observations, patient history, and epidemiological information. The expected result is Negative.  Fact Sheet for Patients:  EntrepreneurPulse.com.au  Fact Sheet for Healthcare Providers:  IncredibleEmployment.be  This test is no t yet approved or cleared by the [place] FDA and  has been authorized for detection and/or diagnosis of SARS-CoV-2 by FDA under an Emergency Use Authorization (EUA). This EUA will remain  in effect (meaning this test can be used) for the duration of the COVID-19 declaration under Section 564(b)(1) of the Act, 21 U.S.C.section 360bbb-3(b)(1), unless the authorization is terminated  or revoked sooner.       Influenza A by PCR NEGATIVE NEGATIVE Final   Influenza B by PCR NEGATIVE NEGATIVE Final    Comment: (NOTE) The Xpert Xpress SARS-CoV-2/FLU/RSV plus assay is intended as an aid in the diagnosis of influenza from Nasopharyngeal swab specimens and should not be used as a sole basis for treatment. Nasal washings and aspirates are unacceptable for Xpert Xpress SARS-CoV-2/FLU/RSV testing.  Fact Sheet for Patients: EntrepreneurPulse.com.au  Fact Sheet for Healthcare Providers: IncredibleEmployment.be  This test is not yet approved or cleared by the [place] FDA and has been authorized for detection and/or diagnosis of SARS-CoV-2 by FDA under an Emergency Use Authorization (EUA). This EUA will remain in effect (meaning this test can be used) for the duration of the COVID-19 declaration under Section 564(b)(1) of the Act, 21 U.S.C. section 360bbb-3(b)(1), unless the authorization is terminated or revoked.  Performed at [facility], [address] [place],  [address]   Urine Culture     Status: None   Collection Time: 08/21/21  7:59 PM   Specimen: Urine,  Random  Result Value Ref Range Status   Specimen Description   Final    URINE, RANDOM  Performed at [facility], [address], [place], [place] [address]    Special Requests   Final    NONE Performed at [facility], [address], [place], [place] [address]    Culture   Final    NO GROWTH Performed at [facility], [place] [address], [place], [place] [address]    Report Status 08/22/2021 FINAL  Final  Blood culture (routine x 2)     Status: None   Collection Time: 08/21/21 10:34 PM   Specimen: BLOOD  Result Value Ref Range Status   Specimen Description BLOOD LEFT ASSIST CONTROL  Final   Special Requests   Final    BOTTLES DRAWN AEROBIC AND ANAEROBIC Blood Culture adequate volume   Culture   Final    NO GROWTH 5 DAYS Performed at [facility], [address] [place], [place] [address]    Report Status 08/26/2021 FINAL  Final  Blood culture (routine x 2)     Status: None   Collection Time: 08/21/21 10:50 PM   Specimen: BLOOD  Result Value Ref Range Status   Specimen Description BLOOD RIGHT FOREARM  Final   Special Requests   Final    BOTTLES DRAWN AEROBIC AND ANAEROBIC Blood Culture results may not be optimal due to an inadequate volume of blood received in culture bottles   Culture   Final    NO GROWTH 5 DAYS Performed at [facility], [address] [place], [place] [address]    Report Status 08/26/2021 FINAL  Final    Procedures and diagnostic studies:  No results found.             LOS: 6 days   Pearse Shiffler  [facility]   Pager on [email or web address]. If 7PM-7AM, please contact night-coverage at [email or web address]     08/27/2021, 1:20 PM

## 2021-08-27 NOTE — Progress Notes (Signed)
The patient has been having runs of SVT up to the 170s but not sustaining. EKG showed ST even though the monitor was showing ST to AFIB. Asymptomatic, very sleepy. Sent secure message to Dr. Damita Dunnings to inform.   Zayden Hahne DNP 2303 PM

## 2021-08-28 ENCOUNTER — Ambulatory Visit: Payer: Medicare Other | Admitting: Physical Therapy

## 2021-08-28 DIAGNOSIS — I9589 Other hypotension: Secondary | ICD-10-CM | POA: Diagnosis not present

## 2021-08-28 DIAGNOSIS — A419 Sepsis, unspecified organism: Secondary | ICD-10-CM | POA: Diagnosis not present

## 2021-08-28 DIAGNOSIS — K5792 Diverticulitis of intestine, part unspecified, without perforation or abscess without bleeding: Secondary | ICD-10-CM | POA: Diagnosis not present

## 2021-08-28 DIAGNOSIS — E876 Hypokalemia: Secondary | ICD-10-CM | POA: Diagnosis not present

## 2021-08-28 LAB — CBC WITH DIFFERENTIAL/PLATELET
Abs Immature Granulocytes: 0.41 10*3/uL — ABNORMAL HIGH (ref 0.00–0.07)
Basophils Absolute: 0.1 10*3/uL (ref 0.0–0.1)
Basophils Relative: 1 %
Eosinophils Absolute: 0.3 10*3/uL (ref 0.0–0.5)
Eosinophils Relative: 2 %
HCT: 29.7 % — ABNORMAL LOW (ref 39.0–52.0)
Hemoglobin: 9.8 g/dL — ABNORMAL LOW (ref 13.0–17.0)
Immature Granulocytes: 3 %
Lymphocytes Relative: 4 %
Lymphs Abs: 0.6 10*3/uL — ABNORMAL LOW (ref 0.7–4.0)
MCH: 35 pg — ABNORMAL HIGH (ref 26.0–34.0)
MCHC: 33 g/dL (ref 30.0–36.0)
MCV: 106.1 fL — ABNORMAL HIGH (ref 80.0–100.0)
Monocytes Absolute: 1.2 10*3/uL — ABNORMAL HIGH (ref 0.1–1.0)
Monocytes Relative: 8 %
Neutro Abs: 11.9 10*3/uL — ABNORMAL HIGH (ref 1.7–7.7)
Neutrophils Relative %: 82 %
Platelets: 255 10*3/uL (ref 150–400)
RBC: 2.8 MIL/uL — ABNORMAL LOW (ref 4.22–5.81)
RDW: 18.3 % — ABNORMAL HIGH (ref 11.5–15.5)
WBC: 14.4 10*3/uL — ABNORMAL HIGH (ref 4.0–10.5)
nRBC: 0 % (ref 0.0–0.2)

## 2021-08-28 LAB — BASIC METABOLIC PANEL
Anion gap: 4 — ABNORMAL LOW (ref 5–15)
BUN: 20 mg/dL (ref 8–23)
CO2: 33 mmol/L — ABNORMAL HIGH (ref 22–32)
Calcium: 7.9 mg/dL — ABNORMAL LOW (ref 8.9–10.3)
Chloride: 97 mmol/L — ABNORMAL LOW (ref 98–111)
Creatinine, Ser: 0.98 mg/dL (ref 0.61–1.24)
GFR, Estimated: >60 mL/min (ref >60)
Glucose, Bld: 126 mg/dL — ABNORMAL HIGH (ref 70–99)
Potassium: 3.8 mmol/L (ref 3.5–5.1)
Sodium: 134 mmol/L — ABNORMAL LOW (ref 135–145)

## 2021-08-28 MED ORDER — SODIUM CHLORIDE 0.9 % IV BOLUS
500.0000 mL | Freq: Once | INTRAVENOUS | Status: AC
  Administered 2021-08-28: 500 mL via INTRAVENOUS

## 2021-08-28 MED ORDER — POTASSIUM CHLORIDE IN NACL 20-0.9 MEQ/L-% IV SOLN
INTRAVENOUS | Status: AC
  Filled 2021-08-28 (×3): qty 1000

## 2021-08-28 MED ORDER — LOPERAMIDE HCL 2 MG PO CAPS
2.0000 mg | ORAL_CAPSULE | Freq: Four times a day (QID) | ORAL | Status: DC | PRN
  Administered 2021-08-28 (×2): 2 mg via ORAL
  Filled 2021-08-28 (×2): qty 1

## 2021-08-28 NOTE — Care Management Important Message (Signed)
Important Message  Patient Details  Name: Jason Powers MRN: [id number] Date of Birth: [birth year]   Medicare Important Message Given:  Yes     [name] 08/28/2021, 2:26 PM

## 2021-08-28 NOTE — Evaluation (Signed)
Clinical/Bedside Swallow Evaluation Patient Details  Name: Jason Powers MRN: [id number] Date of Birth: 10-10-1921  Today's Date: 08/28/2021 Time: SLP Start Time (ACUTE ONLY): 53 SLP Stop Time (ACUTE ONLY): 1320 SLP Time Calculation (min) (ACUTE ONLY): 60 min  Past Medical History:  Past Medical History:  Diagnosis Date   COPD (chronic obstructive pulmonary disease) ([place])    Glaucoma    Hypertension    Past Surgical History:  Past Surgical History:  Procedure Laterality Date   APPENDECTOMY     HPI:  Pt is a 87 y.o. male with medical history significant for COPD, hypertension, iron deficiency anemia, BPH, history of thoracic compression fracture who presents with concerns of fall.  Patient reports that he was sitting on the toilet when he slid and fell.  Otherwise appears to have Dementia and unable to provide any further history.  CT of Head: negative. CTA of the chest showed no PE, showed a chronic compression fracture of T9 and T12 that is unchanged.   CT abdomen/proximal had findings suggestive of early acute diverticulitis without abscess.   Chest CTA: Prominent diffuse emphysematous changes throughout the  lungs. Scattered calcified granulomas. Fibrocalcific changes in the  lung apices particularly on the right consistent with  postinflammatory change. No airspace disease or consolidation. Mild  dependent atelectasis. No pleural effusions.    Assessment / Plan / Recommendation  Clinical Impression  Pt appears to present w/ grossly adequate oropharyngeal phase swallow w/ No overt oropharyngeal phase dysphagia noted, No neuromuscular deficits noted. Pt consumed po trials w/ No overt, clinical s/s of aspiration during po trials. Pt appears at reduced risk for aspiration following general aspiration precautions. However, he does not want to wear his full Dentures at this time d/t discomfort; he has to use adhesive to secure them in mouth -- "I'm not doing that right now!". So, No Solids  were assessed; pt expectorated the Minced trial attempted.     During po trials, pt consumed thin liquids and puree consistencies w/ no overt coughing, decline in vocal quality, or change in respiratory presentation during/post trials. Oral phase appeared [facility] w/ timely bolus management and control of bolus propulsion for A-P transfer for swallowing. Upon attempt w/ Minced food trial, pt did not like the increased texture and exepectorated the bolus w/out any attempt at it. Oral clearing achieved w/ all trials. OM Exam appeared [facility] w/ no unilateral weakness noted. Speech Clear. Edentulous. Pt fed self w/ encouragement to hold the cup; needed help w/ food trials(he did not attempt to self-feed foods).   Recommend a Puree consistency diet w/ gravies/condiments to flavor/moisten; Thin liquids. Recommend general aspiration precautions, feeding assistance at meals. Pills WHOLE in Puree for safer, easier swallowing. Education given on Pills in Puree; food consistencies and easy to eat options; general aspiration precautions -- posted in room. Suspect this diet to be most beneficial for pt d/t Edentulous status and preference NOT to wear the Dentures nor to eat solids -- also for D/C to best meet pt's nutritional needs. ST services can be available if any further needs while admitted. NSG to reconsult if any new needs arise. NSG agreed. SLP Visit Diagnosis: Dysphagia, unspecified (R13.10)    Aspiration Risk   (reduced following general precautions)    Diet Recommendation   Puree consistency diet w/ gravies/condiments to flavor/moisten; Thin liquids. Recommend general aspiration precautions; Setup support and feeding assistance at meals.   Medication Administration: Whole meds with puree    Other  Recommendations Recommended Consults:  (Dietician  f/u) Oral Care Recommendations: Oral care BID;Oral care before and after PO;Patient independent with oral care;Staff/trained caregiver to provide oral care  (support) Other Recommendations:  (n/a)    Recommendations for follow up therapy are one component of a multi-disciplinary discharge planning process, led by the attending physician.  Recommendations may be updated based on patient status, additional functional criteria and insurance authorization.  Follow up Recommendations None      Frequency and Duration  (n/a)   (n/a)       Prognosis Prognosis for Safe Diet Advancement: Fair (-Good) Barriers to Reach Goals: Severity of deficits;Time post onset;Behavior Barriers/Prognosis Comment: possible Dementia      Swallow Study   General Date of Onset: 08/21/21 HPI: Pt is a 87 y.o. male with medical history significant for COPD, hypertension, iron deficiency anemia, BPH, history of thoracic compression fracture who presents with concerns of fall.  Patient reports that he was sitting on the toilet when he slid and fell.  Otherwise appears to have Dementia and unable to provide any further history.  CT of Head: negative. CTA of the chest showed no PE, showed a chronic compression fracture of T9 and T12 that is unchanged.   CT abdomen/proximal had findings suggestive of early acute diverticulitis without abscess.   Chest CTA: Prominent diffuse emphysematous changes throughout the  lungs. Scattered calcified granulomas. Fibrocalcific changes in the  lung apices particularly on the right consistent with  postinflammatory change. No airspace disease or consolidation. Mild  dependent atelectasis. No pleural effusions. Type of Study: Bedside Swallow Evaluation Previous Swallow Assessment: none noted Diet Prior to this Study: Regular;Thin liquids Temperature Spikes Noted: No (wbc 14.4) Respiratory Status: Room air History of Recent Intubation: No Behavior/Cognition: Alert;Cooperative;Pleasant mood;Distractible;Requires cueing (possible Dementia per MD note) Oral Cavity Assessment: Within Functional Limits Oral Care Completed by SLP: Yes Oral Cavity -  Dentition: Edentulous (does not want to wear the Dentures; needs to use the adhesive which was uncomfortable) Vision: Functional for self-feeding Self-Feeding Abilities: Able to feed self;Needs assist;Needs set up (wants assistance) Patient Positioning: Upright in bed (needed support to sit upright) Baseline Vocal Quality: Normal Volitional Cough: Strong Volitional Swallow: Able to elicit    Oral/Motor/Sensory Function Overall Oral Motor/Sensory Function: Within functional limits   Ice Chips Ice chips: Within functional limits Presentation: Spoon (fed; 2 trials)   Thin Liquid Thin Liquid: Within functional limits Presentation: Self Fed;Straw (baseline; 10 trials)    Nectar Thick Nectar Thick Liquid: Not tested   Honey Thick Honey Thick Liquid: Not tested   Puree Puree: Within functional limits Presentation: Spoon (fed; 12+ trials) Other Comments: pudding, applesauce   Solid     Solid: Not tested Other Comments: not wearing the Dentures        [name], MS, CCC-SLP Speech Language Pathologist Rehab Services [phone] Zaidy Absher 08/28/2021,3:33 PM

## 2021-08-28 NOTE — Progress Notes (Signed)
Progress Note    Jason Powers  IDP:[id number] DOB: 04-15-1921  DOA: 08/21/2021 PCP: Crecencio Mc, MD      Brief Narrative:    Medical records reviewed and are as summarized below:  Jason Powers is a 87 y.o. male with medical history significant for recent discharge from the hospital on 08/11/2021 after treatment for acute sigmoid diverticulitis and acute on chronic anemia secondary to diverticular bleed (required blood transfusion) COPD, hypertension, deficiency anemia, BPH, thoracic compression fracture.  He was discharged on a 5-day course of Flagyl.  He presented to the hospital after a fall at home.  He said he was sitting on the toilet when he slid and fell.  He was found to have sepsis secondary to acute sigmoid diverticulitis.    Assessment/Plan:   Principal Problem:   Sepsis ([place]) Active Problems:   Diverticulitis   Hypotension   Hypokalemia   Cervical stenosis of spinal canal    Body mass index is 22.27 kg/m.   Sepsis secondary to acute sigmoid diverticulitis, leukocytosis: WBC is trending down.  Continue Augmentin.  Of note, patient was recently discharged on 08/11/2021 after hospitalization for acute diverticulitis and diverticular bleed.  Dehydration, hypotension, sinus tachycardia: He was given 250 cc bolus overnight.  Give additional bolus with 500 normal saline followed by maintenance IV fluids  Hypokalemia: Improved.  Continue potassium repletion because of diarrhea.  Diarrhea, dark stools: Of note, patient is on iron pills.  H&H is stable.  Antibiotics may be contributing to diarrhea.  Patient also said that is not unusual for him to have diarrhea at home.  Monitor H&H.  Imodium as needed for diarrhea.  S/p mechanical fall, cervical spinal stenosis, old T9 and T12 compression fractures: PT evaluation.  CKD stage IIIa: Creatinine is stable.  No evidence of AKI.  BPH with urinary retention: He has been able to pass urine spontaneously since  removal of Foley catheter on 08/25/2021.  Continue Flomax.   Debility, unsteady gait: PT recommends discharge to SNF.  Other comorbidities include COPD, hypertension, iron deficiency anemia    Diet Order             DIET - DYS 1 Room service appropriate? Yes with Assist; Fluid consistency: Thin  Diet effective now                      Consultants: None  Procedures: None    Medications:    amoxicillin-clavulanate  1 tablet Oral Q12H   Chlorhexidine Gluconate Cloth  6 each Topical Daily   enoxaparin (LOVENOX) injection  40 mg Subcutaneous Q24H   Ferrous Fumarate  1 tablet Oral Q breakfast   fluticasone  1 spray Each Nare Daily   tamsulosin  0.4 mg Oral Daily   Continuous Infusions:  sodium chloride Stopped (08/24/21 1327)     Anti-infectives (From admission, onward)    Start     Dose/Rate Route Frequency Ordered Stop   08/27/21 1000  amoxicillin-clavulanate (AUGMENTIN) 875-125 MG per tablet 1 tablet        1 tablet Oral Every 12 hours 08/27/21 0753     08/22/21 0600  piperacillin-tazobactam (ZOSYN) IVPB 3.375 g  Status:  Discontinued        3.375 g 12.5 mL/hr over 240 Minutes Intravenous Every 8 hours 08/22/21 0215 08/27/21 0753   08/21/21 2245  piperacillin-tazobactam (ZOSYN) IVPB 3.375 g        3.375 g 100 mL/hr over 30 Minutes Intravenous  Once  08/21/21 2235 08/21/21 2333              Family Communication/Anticipated D/C date and plan/Code Status   DVT prophylaxis: enoxaparin (LOVENOX) injection 40 mg Start: 08/23/21 0800     Code Status: DNR  Family Communication: None Disposition Plan:    Status is: Inpatient  Remains inpatient appropriate because:IV treatments appropriate due to intensity of illness or inability to take PO and Inpatient level of care appropriate due to severity of illness  Dispo: The patient is from: Home              Anticipated d/c is to: Home              Patient currently is not medically stable to d/c.    Difficult to place patient No           Subjective:   Interval events noted.  He is having diarrhea.  No fever, abdominal pain or vomiting.   Objective:    Vitals:   08/28/21 0000 08/28/21 0356 08/28/21 0900 08/28/21 1219  BP: (!) 86/54 103/63 108/66 (!) 103/42  Pulse: (!) 103 (!) 103 (!) 101 92  Resp: (!) 24 (!) 21 20 18   Temp: 98.3 F (36.8 C) 98.2 F (36.8 C) 98.1 F (36.7 C) 100.2 F (37.9 C)  TempSrc:  Oral Oral   SpO2:  92% 92% 91%  Weight:      Height:       No data found.   Intake/Output Summary (Last 24 hours) at 08/28/2021 1407 Last data filed at 08/28/2021 1225 Gross per 24 hour  Intake 100 ml  Output 800 ml  Net -700 ml   Filed Weights   08/21/21 1953 08/22/21 2015 08/27/21 1700  Weight: 59 kg 61 kg 64.5 kg    Exam:  GEN: NAD SKIN: Warm and dry EYES: EOMI ENT: MMM CV: RRR PULM: CTA B ABD: soft, ND, NT, +BS CNS: AAO x 3, non focal EXT: No edema or tenderness              Data Reviewed:   I have personally reviewed following labs and imaging studies:  Labs: Labs show the following:   Basic Metabolic Panel: Recent Labs  Lab 08/23/21 0512 08/24/21 0630 08/25/21 0413 08/26/21 0643 08/27/21 0604 08/28/21 0434  NA 140 136 134* 136 133* 134*  K 3.7 2.8* 3.5 3.5 4.6 3.8  CL 106 102 102 100 101 97*  CO2 30 27 27 31 29  33*  GLUCOSE 129* 106* 103* 123* 120* 126*  BUN 31* 21 15 16 18 20   CREATININE 1.17 1.02 0.94 0.87 0.98 0.98  CALCIUM 7.9* 7.6* 7.7* 7.8* 7.6* 7.9*  MG 2.1  --   --   --  1.7  --    GFR Estimated Creatinine Clearance: 38.4 mL/min (by C-G formula based on SCr of 0.98 mg/dL). Liver Function Tests: Recent Labs  Lab 08/21/21 1959  AST 26  ALT 19  ALKPHOS 45  BILITOT 1.6*  PROT 6.3*  ALBUMIN 3.6   Recent Labs  Lab 08/21/21 1959  LIPASE 37   No results for input(s): AMMONIA in the last 168 hours. Coagulation profile No results for input(s): INR, PROTIME in the last 168  hours.  CBC: Recent Labs  Lab 08/24/21 0630 08/25/21 0413 08/26/21 0643 08/27/21 0604 08/28/21 0434  WBC 14.0* 13.9* 16.6* 15.5* 14.4*  NEUTROABS 12.2* 12.0* 14.4* 12.8* 11.9*  HGB 8.7* 9.4* 9.9* 10.3* 9.8*  HCT 27.2* 28.9* 30.2* 32.1* 29.7*  MCV 106.3* 107.0* 107.5* 107.4* 106.1*  PLT 219 236 216 234 255   Cardiac Enzymes: No results for input(s): CKTOTAL, CKMB, CKMBINDEX, TROPONINI in the last 168 hours. BNP (last 3 results) No results for input(s): PROBNP in the last 8760 hours. CBG: No results for input(s): GLUCAP in the last 168 hours. D-Dimer: No results for input(s): DDIMER in the last 72 hours. Hgb A1c: No results for input(s): HGBA1C in the last 72 hours. Lipid Profile: No results for input(s): CHOL, HDL, LDLCALC, TRIG, CHOLHDL, LDLDIRECT in the last 72 hours. Thyroid function studies: No results for input(s): TSH, T4TOTAL, T3FREE, THYROIDAB in the last 72 hours.  Invalid input(s): FREET3 Anemia work up: No results for input(s): VITAMINB12, FOLATE, FERRITIN, TIBC, IRON, RETICCTPCT in the last 72 hours. Sepsis Labs: Recent Labs  Lab 08/21/21 2233 08/22/21 0641 08/24/21 0630 08/25/21 0413 08/26/21 0643 08/27/21 0604 08/28/21 0434  WBC  --  26.2*   < > 13.9* 16.6* 15.5* 14.4*  LATICACIDVEN 1.4 1.3  --   --   --   --   --    < > = values in this interval not displayed.    Microbiology Recent Results (from the past 240 hour(s))  Resp Panel by RT-PCR (Flu A&B, Covid) Nasopharyngeal Swab     Status: None   Collection Time: 08/21/21  7:59 PM   Specimen: Nasopharyngeal Swab; Nasopharyngeal(NP) swabs in vial transport medium  Result Value Ref Range Status   SARS Coronavirus 2 by RT PCR NEGATIVE NEGATIVE Final    Comment: (NOTE) SARS-CoV-2 target nucleic acids are NOT DETECTED.  The SARS-CoV-2 RNA is generally detectable in upper respiratory specimens during the acute phase of infection. The lowest concentration of SARS-CoV-2 viral copies this assay can  detect is 138 copies/mL. A negative result does not preclude SARS-Cov-2 infection and should not be used as the sole basis for treatment or other patient management decisions. A negative result may occur with  improper specimen collection/handling, submission of specimen other than nasopharyngeal swab, presence of viral mutation(s) within the areas targeted by this assay, and inadequate number of viral copies(<138 copies/mL). A negative result must be combined with clinical observations, patient history, and epidemiological information. The expected result is Negative.  Fact Sheet for Patients:  EntrepreneurPulse.com.au  Fact Sheet for Healthcare Providers:  IncredibleEmployment.be  This test is no t yet approved or cleared by the [place] FDA and  has been authorized for detection and/or diagnosis of SARS-CoV-2 by FDA under an Emergency Use Authorization (EUA). This EUA will remain  in effect (meaning this test can be used) for the duration of the COVID-19 declaration under Section 564(b)(1) of the Act, 21 U.S.C.section 360bbb-3(b)(1), unless the authorization is terminated  or revoked sooner.       Influenza A by PCR NEGATIVE NEGATIVE Final   Influenza B by PCR NEGATIVE NEGATIVE Final    Comment: (NOTE) The Xpert Xpress SARS-CoV-2/FLU/RSV plus assay is intended as an aid in the diagnosis of influenza from Nasopharyngeal swab specimens and should not be used as a sole basis for treatment. Nasal washings and aspirates are unacceptable for Xpert Xpress SARS-CoV-2/FLU/RSV testing.  Fact Sheet for Patients: EntrepreneurPulse.com.au  Fact Sheet for Healthcare Providers: IncredibleEmployment.be  This test is not yet approved or cleared by the [place] FDA and has been authorized for detection and/or diagnosis of SARS-CoV-2 by FDA under an Emergency Use Authorization (EUA). This EUA will remain in  effect (meaning this test can be used) for the duration of  the COVID-19 declaration under Section 564(b)(1) of the Act, 21 U.S.C. section 360bbb-3(b)(1), unless the authorization is terminated or revoked.  Performed at [facility], [address], [place], [place] [address]   Urine Culture     Status: None   Collection Time: 08/21/21  7:59 PM   Specimen: Urine, Random  Result Value Ref Range Status   Specimen Description   Final    URINE, RANDOM Performed at [facility], [address] [place], [place] [address]    Special Requests   Final    NONE Performed at [facility], [address], [place], [place] [address]    Culture   Final    NO GROWTH Performed at [facility], [place] [address] [place], [place] [address]    Report Status 08/22/2021 FINAL  Final  Blood culture (routine x 2)     Status: None   Collection Time: 08/21/21 10:34 PM   Specimen: BLOOD  Result Value Ref Range Status   Specimen Description BLOOD LEFT ASSIST CONTROL  Final   Special Requests   Final    BOTTLES DRAWN AEROBIC AND ANAEROBIC Blood Culture adequate volume   Culture   Final    NO GROWTH 5 DAYS Performed at [facility], [address] [place], [place] [address]    Report Status 08/26/2021 FINAL  Final  Blood culture (routine x 2)     Status: None   Collection Time: 08/21/21 10:50 PM   Specimen: BLOOD  Result Value Ref Range Status   Specimen Description BLOOD RIGHT FOREARM  Final   Special Requests   Final    BOTTLES DRAWN AEROBIC AND ANAEROBIC Blood Culture results may not be optimal due to an inadequate volume of blood received in culture bottles   Culture   Final    NO GROWTH 5 DAYS Performed at [facility], [address], [place],  [address]    Report Status 08/26/2021 FINAL  Final    Procedures and diagnostic studies:  No results found.             LOS: 7 days   Steen Bisig  [facility]    Pager on [email or web address]. If 7PM-7AM, please contact night-coverage at [email or web address]     08/28/2021, 2:07 PM

## 2021-08-28 NOTE — Progress Notes (Signed)
Physical Therapy Treatment Patient Details Name: Jason Powers MRN: [id number] DOB: 02-25-1921 Today's Date: 08/28/2021   History of Present Illness Pt is a 87 y.o. M w/ PMH significant for COPD, hypertension, iron deficiency anemia, dementia, BPH, history of thoracic compression fracture who presents with concerns of fall (per chart)    PT Comments    Patient alert, agreeable to PT with encouragement, pt focused on and requesting to be cleaned up from BM. ModA with cues for step by step sequencing for rolling L and R, supine <> sit maxA. He was able to sit EOB for several minutes to allow for rest, sip of water, and HR check. HR ranged from 100-140's with standing, recovered quickly at rest. Sit <> stand performed twice with maxA, with extended time, verbal cues, and encouragement pt able to maintain standing with minA. ModA for lateral stepping towards HOB. Pt declined OOB to chair this AM, stated he felt too weak and expressed concerns about bowel movements. The patient would benefit from further skilled PT intervention to continue to progress towards goals. Recommendation remains appropriate.      Recommendations for follow up therapy are one component of a multi-disciplinary discharge planning process, led by the attending physician.  Recommendations may be updated based on patient status, additional functional criteria and insurance authorization.  Follow Up Recommendations  SNF;Supervision/Assistance - 24 hour     Equipment Recommendations  Other (comment) (TBD at next venue of care)    Recommendations for Other Services       Precautions / Restrictions Precautions Precautions: Fall Restrictions Weight Bearing Restrictions: No     Mobility  Bed Mobility Overal bed mobility: Needs Assistance Bed Mobility: Supine to Sit;Sit to Supine;Rolling Rolling: Mod assist   Supine to sit: Max assist Sit to supine: Max assist   General bed mobility comments: vcs for rolling &  reaching; requires BLE and trunk assist, attempted to allow pt time to perform movement independently to mimic PLOF but largely unable to mobilize due to weakness.    Transfers Overall transfer level: Needs assistance Equipment used: Rolling walker (2 wheeled) Transfers: Sit to/from Stand Sit to Stand: Max assist         General transfer comment: performed twice, with time pt able to improved standing balance but requiring at least a minA for posterior lean and RW stabilization.  Ambulation/Gait Ambulation/Gait assistance: Max assist Gait Distance (Feet): 1 Feet Assistive device: Rolling walker (2 wheeled) Gait Pattern/deviations: Trunk flexed     General Gait Details: Pt able to step laterally towards HOB with modA, and RW management. encouragement throughout due to pt fear of falling/feeling weak.   Stairs             Wheelchair Mobility    Modified Rankin (Stroke Patients Only)       Balance Overall balance assessment: Needs assistance Sitting-balance support: Single extremity supported;Bilateral upper extremity supported Sitting balance-Leahy Scale: Fair     Standing balance support: Bilateral upper extremity supported Standing balance-Leahy Scale: Zero Standing balance comment: max-minA to maintain standing balance                            Cognition Arousal/Alertness: Awake/alert Behavior During Therapy: WFL for tasks assessed/performed Overall Cognitive Status: History of cognitive impairments - at baseline  Exercises Other Exercises Other Exercises: Pt cleaned of BM, rolling modA with ste pby step cues for instruction    General Comments General comments (skin integrity, edema, etc.): HR from 100 - 140s throughout session, increased with mobility attempts but recovered quickly following activity      Pertinent Vitals/Pain Pain Assessment: Faces Faces Pain Scale: Hurts little  more Pain Location: all over body aches Pain Descriptors / Indicators: Aching;Guarding;Grimacing    Home Living                      Prior Function            PT Goals (current goals can now be found in the care plan section) Progress towards PT goals: Progressing toward goals    Frequency    Min 2X/week      PT Plan Current plan remains appropriate    Co-evaluation              AM-PAC PT "6 Clicks" Mobility   Outcome Measure  Help needed turning from your back to your side while in a flat bed without using bedrails?: A Lot Help needed moving from lying on your back to sitting on the side of a flat bed without using bedrails?: A Lot Help needed moving to and from a bed to a chair (including a wheelchair)?: A Lot Help needed standing up from a chair using your arms (e.g., wheelchair or bedside chair)?: A Lot Help needed to walk in hospital room?: A Lot Help needed climbing 3-5 steps with a railing? : Total 6 Click Score: 11    End of Session Equipment Utilized During Treatment: Gait belt Activity Tolerance: Patient tolerated treatment well Patient left: in bed;with call bell/phone within reach;with bed alarm set;with nursing/sitter in room Nurse Communication: Mobility status PT Visit Diagnosis: Other abnormalities of gait and mobility (R26.89);Repeated falls (R29.6);History of falling (Z91.81)     Time: 5638-7564 PT Time Calculation (min) (ACUTE ONLY): 25 min  Charges:  $Therapeutic Exercise: 8-22 mins $Therapeutic Activity: 8-22 mins                     Lieutenant Diego PT, DPT 10:18 AM,08/28/21

## 2021-08-28 NOTE — TOC Progression Note (Addendum)
Transition of Care [facility]) - Progression Note    Patient Details  Name: Jason Powers MRN: [id number] Date of Birth: 03-28-1921  Transition of Care [facility]) CM/SW [place], [place] Phone Number: 08/28/2021, 9:15 AM  Clinical Narrative:     CSW notes [place] made a bed offer, which was patient preference. CSW reached out to [place] [facility]  to accept bed, CSW has initiated insurance auth.   CSW has reached out to PT as an update recent note is needed for insurance auth.  Pending updated PT note for auth.   Pending insurance auth at this time.   Expected Discharge Plan: [place] Barriers to Discharge: Continued Medical Work up  Expected Discharge Plan and Services Expected Discharge Plan: [place] Acute Care Choice:  (TBD) Living arrangements for the past 2 months: Single Family Home                                       Social Determinants of Health (SDOH) Interventions    Readmission Risk Interventions No flowsheet data found.

## 2021-08-29 DIAGNOSIS — A419 Sepsis, unspecified organism: Secondary | ICD-10-CM | POA: Diagnosis not present

## 2021-08-29 DIAGNOSIS — E861 Hypovolemia: Secondary | ICD-10-CM | POA: Diagnosis not present

## 2021-08-29 DIAGNOSIS — I9589 Other hypotension: Secondary | ICD-10-CM | POA: Diagnosis not present

## 2021-08-29 DIAGNOSIS — K5792 Diverticulitis of intestine, part unspecified, without perforation or abscess without bleeding: Secondary | ICD-10-CM | POA: Diagnosis not present

## 2021-08-29 LAB — CBC WITH DIFFERENTIAL/PLATELET
Abs Immature Granulocytes: 0.62 10*3/uL — ABNORMAL HIGH (ref 0.00–0.07)
Basophils Absolute: 0.1 10*3/uL (ref 0.0–0.1)
Basophils Relative: 1 %
Eosinophils Absolute: 0.4 10*3/uL (ref 0.0–0.5)
Eosinophils Relative: 2 %
HCT: 29.6 % — ABNORMAL LOW (ref 39.0–52.0)
Hemoglobin: 9.5 g/dL — ABNORMAL LOW (ref 13.0–17.0)
Immature Granulocytes: 3 %
Lymphocytes Relative: 3 %
Lymphs Abs: 0.6 10*3/uL — ABNORMAL LOW (ref 0.7–4.0)
MCH: 33.8 pg (ref 26.0–34.0)
MCHC: 32.1 g/dL (ref 30.0–36.0)
MCV: 105.3 fL — ABNORMAL HIGH (ref 80.0–100.0)
Monocytes Absolute: 1.2 10*3/uL — ABNORMAL HIGH (ref 0.1–1.0)
Monocytes Relative: 6 %
Neutro Abs: 16.5 10*3/uL — ABNORMAL HIGH (ref 1.7–7.7)
Neutrophils Relative %: 85 %
Platelets: 259 10*3/uL (ref 150–400)
RBC: 2.81 MIL/uL — ABNORMAL LOW (ref 4.22–5.81)
RDW: 18.4 % — ABNORMAL HIGH (ref 11.5–15.5)
WBC: 19.4 10*3/uL — ABNORMAL HIGH (ref 4.0–10.5)
nRBC: 0 % (ref 0.0–0.2)

## 2021-08-29 LAB — BASIC METABOLIC PANEL
Anion gap: 7 (ref 5–15)
BUN: 19 mg/dL (ref 8–23)
CO2: 27 mmol/L (ref 22–32)
Calcium: 7.5 mg/dL — ABNORMAL LOW (ref 8.9–10.3)
Chloride: 100 mmol/L (ref 98–111)
Creatinine, Ser: 0.85 mg/dL (ref 0.61–1.24)
GFR, Estimated: >60 mL/min (ref >60)
Glucose, Bld: 125 mg/dL — ABNORMAL HIGH (ref 70–99)
Potassium: 4.3 mmol/L (ref 3.5–5.1)
Sodium: 134 mmol/L — ABNORMAL LOW (ref 135–145)

## 2021-08-29 LAB — MAGNESIUM: Magnesium: 1.8 mg/dL (ref 1.7–2.4)

## 2021-08-29 MED ORDER — SACCHAROMYCES BOULARDII 250 MG PO CAPS
250.0000 mg | ORAL_CAPSULE | Freq: Two times a day (BID) | ORAL | Status: DC
  Administered 2021-08-29 – 2021-09-01 (×7): 250 mg via ORAL
  Filled 2021-08-29 (×8): qty 1

## 2021-08-29 MED ORDER — SODIUM CHLORIDE 0.9 % IV SOLN
1.0000 g | INTRAVENOUS | Status: DC
  Administered 2021-08-29 – 2021-08-31 (×3): 1 g via INTRAVENOUS
  Filled 2021-08-29: qty 10
  Filled 2021-08-29 (×2): qty 1
  Filled 2021-08-29: qty 10
  Filled 2021-08-29: qty 1

## 2021-08-29 MED ORDER — METRONIDAZOLE 500 MG PO TABS
500.0000 mg | ORAL_TABLET | Freq: Three times a day (TID) | ORAL | Status: DC
  Administered 2021-08-29 – 2021-09-01 (×8): 500 mg via ORAL
  Filled 2021-08-29 (×13): qty 1

## 2021-08-29 NOTE — Progress Notes (Signed)
Progress Note    Jason Powers  KAJ:[id number] DOB: 1921-04-02  DOA: 08/21/2021 PCP: Crecencio Mc, MD      Brief Narrative:    Medical records reviewed and are as summarized below:  Jason Powers is a 87 y.o. male with medical history significant for recent discharge from the hospital on 08/11/2021 after treatment for acute sigmoid diverticulitis and acute on chronic anemia secondary to diverticular bleed (required blood transfusion) COPD, hypertension, deficiency anemia, BPH, thoracic compression fracture.  He was discharged on a 5-day course of Flagyl.  He presented to the hospital after a fall at home.  He said he was sitting on the toilet when he slid and fell.  He was found to have sepsis secondary to acute sigmoid diverticulitis.    Assessment/Plan:   Principal Problem:   Sepsis ([place]) Active Problems:   Diverticulitis   Hypotension   Hypokalemia   Cervical stenosis of spinal canal    Body mass index is 22.03 kg/m.   Sepsis secondary to acute sigmoid diverticulitis, leukocytosis: WBC is trending up again.  Case was discussed with ID specialist, Dr. Ola Spurr because of concern for C. difficile.  He recommended switching from Augmentin to IV ceftriaxone and Flagyl Of note, patient was recently discharged on 08/11/2021 after hospitalization for acute diverticulitis and diverticular bleed.  Dehydration, hypotension, sinus tachycardia: BP is still low.  Continue IV fluids.    Hypokalemia: Improved.  Continue potassium repletion because of diarrhea.  Diarrhea, dark stools: Discontinue iron pills for now.  Hold Imodium follow-up.  Consulted Dr. Ola Spurr, ID specialist, because of concern for C. difficile infection.  He will follow the patient.  C. difficile testing will be considered tomorrow if leukocytosis worsens.  S/p mechanical fall, cervical spinal stenosis, old T9 and T12 compression fractures: PT evaluation.  CKD stage IIIa: Creatinine is stable.   No evidence of AKI.  BPH with urinary retention: He has been able to pass urine spontaneously since removal of Foley catheter on 08/25/2021.  Continue Flomax.   Debility, unsteady gait: PT recommends discharge to SNF.  Other comorbidities include COPD, hypertension, iron deficiency anemia   Plan discussed with his daughter, [name], over the phone.   Diet Order             DIET - DYS 1 Room service appropriate? Yes with Assist; Fluid consistency: Thin  Diet effective now                      Consultants: None  Procedures: None    Medications:    Chlorhexidine Gluconate Cloth  6 each Topical Daily   enoxaparin (LOVENOX) injection  40 mg Subcutaneous Q24H   fluticasone  1 spray Each Nare Daily   metroNIDAZOLE  500 mg Oral Q8H   saccharomyces boulardii  250 mg Oral BID   tamsulosin  0.4 mg Oral Daily   Continuous Infusions:  sodium chloride Stopped (08/27/21 0525)   cefTRIAXone (ROCEPHIN)  IV       Anti-infectives (From admission, onward)    Start     Dose/Rate Route Frequency Ordered Stop   08/29/21 1800  metroNIDAZOLE (FLAGYL) tablet 500 mg        500 mg Oral Every 8 hours 08/29/21 1542     08/29/21 1700  cefTRIAXone (ROCEPHIN) 1 g in sodium chloride 0.9 % 100 mL IVPB        1 g 200 mL/hr over 30 Minutes Intravenous Every 24 hours 08/29/21 1542  08/27/21 1000  amoxicillin-clavulanate (AUGMENTIN) 875-125 MG per tablet 1 tablet  Status:  Discontinued        1 tablet Oral Every 12 hours 08/27/21 0753 08/29/21 1542   08/22/21 0600  piperacillin-tazobactam (ZOSYN) IVPB 3.375 g  Status:  Discontinued        3.375 g 12.5 mL/hr over 240 Minutes Intravenous Every 8 hours 08/22/21 0215 08/27/21 0753   08/21/21 2245  piperacillin-tazobactam (ZOSYN) IVPB 3.375 g        3.375 g 100 mL/hr over 30 Minutes Intravenous  Once 08/21/21 2235 08/21/21 2333              Family Communication/Anticipated D/C date and plan/Code Status   DVT prophylaxis:  enoxaparin (LOVENOX) injection 40 mg Start: 08/23/21 0800     Code Status: DNR  Family Communication: Janie, daughter Disposition Plan:    Status is: Inpatient  Remains inpatient appropriate because:IV treatments appropriate due to intensity of illness or inability to take PO and Inpatient level of care appropriate due to severity of illness  Dispo: The patient is from: Home              Anticipated d/c is to: Home              Patient currently is not medically stable to d/c.   Difficult to place patient No           Subjective:   Interval events noted.  He continues to have dark watery stools.  No vomiting or abdominal pain   Objective:    Vitals:   08/29/21 0611 08/29/21 0811 08/29/21 1237 08/29/21 1620  BP:  (!) 110/52 (!) 91/57 97/66  Pulse:  99 (!) 55 100  Resp:  18 17 17   Temp:  98.1 F (36.7 C) 98.1 F (36.7 C) 98.1 F (36.7 C)  TempSrc:      SpO2:  92% 90% 91%  Weight: 63.8 kg     Height:       No data found.   Intake/Output Summary (Last 24 hours) at 08/29/2021 1649 Last data filed at 08/29/2021 1239 Gross per 24 hour  Intake 957.22 ml  Output 1100 ml  Net -142.78 ml   Filed Weights   08/22/21 2015 08/27/21 1700 08/29/21 0611  Weight: 61 kg 64.5 kg 63.8 kg    Exam:  GEN: NAD SKIN: Warm and dry EYES: No pallor or icterus ENT: MMM CV: RRR PULM: CTA B ABD: soft, ND, mild left lower quadrant tenderness, no rebound tenderness or guarding, +BS CNS: AAO x 3, non focal EXT: No edema or tenderness            Data Reviewed:   I have personally reviewed following labs and imaging studies:  Labs: Labs show the following:   Basic Metabolic Panel: Recent Labs  Lab 08/23/21 0512 08/24/21 0630 08/25/21 0413 08/26/21 0643 08/27/21 0604 08/28/21 0434 08/29/21 0440  NA 140   < > 134* 136 133* 134* 134*  K 3.7   < > 3.5 3.5 4.6 3.8 4.3  CL 106   < > 102 100 101 97* 100  CO2 30   < > 27 31 29  33* 27  GLUCOSE 129*   < >  103* 123* 120* 126* 125*  BUN 31*   < > 15 16 18 20 19   CREATININE 1.17   < > 0.94 0.87 0.98 0.98 0.85  CALCIUM 7.9*   < > 7.7* 7.8* 7.6* 7.9* 7.5*  MG 2.1  --   --   --  1.7  --  1.8   < > = values in this interval not displayed.   GFR Estimated Creatinine Clearance: 43.8 mL/min (by C-G formula based on SCr of 0.85 mg/dL). Liver Function Tests: No results for input(s): AST, ALT, ALKPHOS, BILITOT, PROT, ALBUMIN in the last 168 hours.  No results for input(s): LIPASE, AMYLASE in the last 168 hours.  No results for input(s): AMMONIA in the last 168 hours. Coagulation profile No results for input(s): INR, PROTIME in the last 168 hours.  CBC: Recent Labs  Lab 08/25/21 0413 08/26/21 0643 08/27/21 0604 08/28/21 0434 08/29/21 0440  WBC 13.9* 16.6* 15.5* 14.4* 19.4*  NEUTROABS 12.0* 14.4* 12.8* 11.9* 16.5*  HGB 9.4* 9.9* 10.3* 9.8* 9.5*  HCT 28.9* 30.2* 32.1* 29.7* 29.6*  MCV 107.0* 107.5* 107.4* 106.1* 105.3*  PLT 236 216 234 255 259   Cardiac Enzymes: No results for input(s): CKTOTAL, CKMB, CKMBINDEX, TROPONINI in the last 168 hours. BNP (last 3 results) No results for input(s): PROBNP in the last 8760 hours. CBG: No results for input(s): GLUCAP in the last 168 hours. D-Dimer: No results for input(s): DDIMER in the last 72 hours. Hgb A1c: No results for input(s): HGBA1C in the last 72 hours. Lipid Profile: No results for input(s): CHOL, HDL, LDLCALC, TRIG, CHOLHDL, LDLDIRECT in the last 72 hours. Thyroid function studies: No results for input(s): TSH, T4TOTAL, T3FREE, THYROIDAB in the last 72 hours.  Invalid input(s): FREET3 Anemia work up: No results for input(s): VITAMINB12, FOLATE, FERRITIN, TIBC, IRON, RETICCTPCT in the last 72 hours. Sepsis Labs: Recent Labs  Lab 08/26/21 0643 08/27/21 0604 08/28/21 0434 08/29/21 0440  WBC 16.6* 15.5* 14.4* 19.4*    Microbiology Recent Results (from the past 240 hour(s))  Resp Panel by RT-PCR (Flu A&B, Covid)  Nasopharyngeal Swab     Status: None   Collection Time: 08/21/21  7:59 PM   Specimen: Nasopharyngeal Swab; Nasopharyngeal(NP) swabs in vial transport medium  Result Value Ref Range Status   SARS Coronavirus 2 by RT PCR NEGATIVE NEGATIVE Final    Comment: (NOTE) SARS-CoV-2 target nucleic acids are NOT DETECTED.  The SARS-CoV-2 RNA is generally detectable in upper respiratory specimens during the acute phase of infection. The lowest concentration of SARS-CoV-2 viral copies this assay can detect is 138 copies/mL. A negative result does not preclude SARS-Cov-2 infection and should not be used as the sole basis for treatment or other patient management decisions. A negative result may occur with  improper specimen collection/handling, submission of specimen other than nasopharyngeal swab, presence of viral mutation(s) within the areas targeted by this assay, and inadequate number of viral copies(<138 copies/mL). A negative result must be combined with clinical observations, patient history, and epidemiological information. The expected result is Negative.  Fact Sheet for Patients:  EntrepreneurPulse.com.au  Fact Sheet for Healthcare Providers:  IncredibleEmployment.be  This test is no t yet approved or cleared by the [place] FDA and  has been authorized for detection and/or diagnosis of SARS-CoV-2 by FDA under an Emergency Use Authorization (EUA). This EUA will remain  in effect (meaning this test can be used) for the duration of the COVID-19 declaration under Section 564(b)(1) of the Act, 21 U.S.C.section 360bbb-3(b)(1), unless the authorization is terminated  or revoked sooner.       Influenza A by PCR NEGATIVE NEGATIVE Final   Influenza B by PCR NEGATIVE NEGATIVE Final    Comment: (NOTE) The Xpert Xpress SARS-CoV-2/FLU/RSV plus assay is intended as an aid in the diagnosis of influenza  from Nasopharyngeal swab specimens and should not be  used as a sole basis for treatment. Nasal washings and aspirates are unacceptable for Xpert Xpress SARS-CoV-2/FLU/RSV testing.  Fact Sheet for Patients: EntrepreneurPulse.com.au  Fact Sheet for Healthcare Providers: IncredibleEmployment.be  This test is not yet approved or cleared by the [place] FDA and has been authorized for detection and/or diagnosis of SARS-CoV-2 by FDA under an Emergency Use Authorization (EUA). This EUA will remain in effect (meaning this test can be used) for the duration of the COVID-19 declaration under Section 564(b)(1) of the Act, 21 U.S.C. section 360bbb-3(b)(1), unless the authorization is terminated or revoked.  Performed at [facility], [address], [place], [place] [address]   Urine Culture     Status: None   Collection Time: 08/21/21  7:59 PM   Specimen: Urine, Random  Result Value Ref Range Status   Specimen Description   Final    URINE, RANDOM Performed at [facility], [address] [place], [place] [address]    Special Requests   Final    NONE Performed at [facility], [address] [place], [place] [address]    Culture   Final    NO GROWTH Performed at [facility], [place] [address] [place], [place] [address]    Report Status 08/22/2021 FINAL  Final  Blood culture (routine x 2)     Status: None   Collection Time: 08/21/21 10:34 PM   Specimen: BLOOD  Result Value Ref Range Status   Specimen Description BLOOD LEFT ASSIST CONTROL  Final   Special Requests   Final    BOTTLES DRAWN AEROBIC AND ANAEROBIC Blood Culture adequate volume   Culture   Final    NO GROWTH 5 DAYS Performed at [facility], [address] [place], [place] [address]    Report Status 08/26/2021 FINAL  Final  Blood culture (routine x 2)     Status: None   Collection Time: 08/21/21 10:50 PM   Specimen: BLOOD  Result Value Ref Range Status   Specimen Description BLOOD  RIGHT FOREARM  Final   Special Requests   Final    BOTTLES DRAWN AEROBIC AND ANAEROBIC Blood Culture results may not be optimal due to an inadequate volume of blood received in culture bottles   Culture   Final    NO GROWTH 5 DAYS Performed at [facility], [address] [place], [place] [address]    Report Status 08/26/2021 FINAL  Final    Procedures and diagnostic studies:  No results found.             LOS: 8 days   Shereece Wellborn  [facility]   Pager on [email or web address]. If 7PM-7AM, please contact night-coverage at [email or web address]     08/29/2021, 4:49 PM

## 2021-08-30 ENCOUNTER — Encounter: Payer: Medicare Other | Admitting: Physical Therapy

## 2021-08-30 DIAGNOSIS — M4802 Spinal stenosis, cervical region: Secondary | ICD-10-CM | POA: Diagnosis not present

## 2021-08-30 DIAGNOSIS — A419 Sepsis, unspecified organism: Secondary | ICD-10-CM | POA: Diagnosis not present

## 2021-08-30 DIAGNOSIS — E876 Hypokalemia: Secondary | ICD-10-CM | POA: Diagnosis not present

## 2021-08-30 DIAGNOSIS — K5792 Diverticulitis of intestine, part unspecified, without perforation or abscess without bleeding: Secondary | ICD-10-CM | POA: Diagnosis not present

## 2021-08-30 DIAGNOSIS — E43 Unspecified severe protein-calorie malnutrition: Secondary | ICD-10-CM | POA: Insufficient documentation

## 2021-08-30 LAB — CBC WITH DIFFERENTIAL/PLATELET
Abs Immature Granulocytes: 0.6 10*3/uL — ABNORMAL HIGH (ref 0.00–0.07)
Basophils Absolute: 0.1 10*3/uL (ref 0.0–0.1)
Basophils Relative: 1 %
Eosinophils Absolute: 0.4 10*3/uL (ref 0.0–0.5)
Eosinophils Relative: 2 %
HCT: 32.5 % — ABNORMAL LOW (ref 39.0–52.0)
Hemoglobin: 10.2 g/dL — ABNORMAL LOW (ref 13.0–17.0)
Immature Granulocytes: 3 %
Lymphocytes Relative: 3 %
Lymphs Abs: 0.6 10*3/uL — ABNORMAL LOW (ref 0.7–4.0)
MCH: 33.6 pg (ref 26.0–34.0)
MCHC: 31.4 g/dL (ref 30.0–36.0)
MCV: 106.9 fL — ABNORMAL HIGH (ref 80.0–100.0)
Monocytes Absolute: 1 10*3/uL (ref 0.1–1.0)
Monocytes Relative: 5 %
Neutro Abs: 16.2 10*3/uL — ABNORMAL HIGH (ref 1.7–7.7)
Neutrophils Relative %: 86 %
Platelets: 280 10*3/uL (ref 150–400)
RBC: 3.04 MIL/uL — ABNORMAL LOW (ref 4.22–5.81)
RDW: 18.1 % — ABNORMAL HIGH (ref 11.5–15.5)
WBC: 19 10*3/uL — ABNORMAL HIGH (ref 4.0–10.5)
nRBC: 0 % (ref 0.0–0.2)

## 2021-08-30 LAB — COMPREHENSIVE METABOLIC PANEL
ALT: 13 U/L (ref 0–44)
AST: 22 U/L (ref 15–41)
Albumin: 2.3 g/dL — ABNORMAL LOW (ref 3.5–5.0)
Alkaline Phosphatase: 49 U/L (ref 38–126)
Anion gap: 9 (ref 5–15)
BUN: 17 mg/dL (ref 8–23)
CO2: 30 mmol/L (ref 22–32)
Calcium: 7.9 mg/dL — ABNORMAL LOW (ref 8.9–10.3)
Chloride: 96 mmol/L — ABNORMAL LOW (ref 98–111)
Creatinine, Ser: 0.95 mg/dL (ref 0.61–1.24)
GFR, Estimated: >60 mL/min (ref >60)
Glucose, Bld: 193 mg/dL — ABNORMAL HIGH (ref 70–99)
Potassium: 4 mmol/L (ref 3.5–5.1)
Sodium: 135 mmol/L (ref 135–145)
Total Bilirubin: 0.5 mg/dL (ref 0.3–1.2)
Total Protein: 5.4 g/dL — ABNORMAL LOW (ref 6.5–8.1)

## 2021-08-30 LAB — MAGNESIUM: Magnesium: 1.9 mg/dL (ref 1.7–2.4)

## 2021-08-30 LAB — PHOSPHORUS: Phosphorus: 2.8 mg/dL (ref 2.5–4.6)

## 2021-08-30 MED ORDER — SODIUM CHLORIDE 0.9 % IV SOLN: INTRAVENOUS | Status: DC

## 2021-08-30 MED ORDER — ENSURE ENLIVE PO LIQD
237.0000 mL | Freq: Three times a day (TID) | ORAL | Status: DC
  Administered 2021-08-30 – 2021-09-01 (×2): 237 mL via ORAL

## 2021-08-30 MED ORDER — ADULT MULTIVITAMIN W/MINERALS CH
1.0000 | ORAL_TABLET | Freq: Every day | ORAL | Status: DC
  Administered 2021-08-31 – 2021-09-01 (×2): 1 via ORAL
  Filled 2021-08-30 (×2): qty 1

## 2021-08-30 NOTE — Progress Notes (Signed)
Initial Nutrition Assessment  DOCUMENTATION CODES:   Severe malnutrition in context of chronic illness  INTERVENTION:   Ensure Enlive po TID, each supplement provides 350 kcal and 20 grams of protein  Magic cup TID with meals, each supplement provides 290 kcal and 9 grams of protein  MVI po daily   Pt at high refeed risk; recommend monitor potassium, magnesium and phosphorus labs daily until stable  NUTRITION DIAGNOSIS:   Severe Malnutrition related to chronic illness (COPD, advanced age) as evidenced by severe fat depletion, severe muscle depletion.  GOAL:   Patient will meet greater than or equal to 90% of their needs  MONITOR:   PO intake, Supplement acceptance, Labs, Weight trends, Skin, I & O's  REASON FOR ASSESSMENT:   Malnutrition Screening Tool    ASSESSMENT:   87 y/o male with h/o COPD, HTN, iron deficiency, BPH and CKD III who is admitted with acute diverticulitis and sepsis.  Met with pt in room today. Pt reports fair appetite and oral intake at baseline but reports that his oral intake is poor in the hospital because he does not like our food. Pt seen by SLP on 10/17 and placed on a pureed diet. Pt reports that he wears dentures and feels that he is able to eat well when his dentures are in. Pt is documented to have eaten 25% of meals in hospital yesterday. Pt's lunch tray is sitting in his side table untouched. Pt reports that he drinks strawberry Ensure at home. RD will add supplements and MVI to help pt meet his estimated needs. Pt is likely at refeed risk. Per chart, pt is down 18lbs(12%) over the past year; this is not significant. Pt is up ~10lbs since admit.   Medications reviewed and include: lovenox, metronidazole, florastor, ceftriaxone   Labs reviewed: K 4.0 wnl, P 2.8 wnl, Mg 1.9 wnl Wbc- 19.0(H), Hgb 10.2(L), Hct 32.5(L)  NUTRITION - FOCUSED PHYSICAL EXAM:  Flowsheet Row Most Recent Value  Orbital Region Severe depletion  Upper Arm Region  Severe depletion  Thoracic and Lumbar Region Severe depletion  Buccal Region Severe depletion  Temple Region Severe depletion  Clavicle Bone Region Severe depletion  Clavicle and Acromion Bone Region Severe depletion  Scapular Bone Region Severe depletion  Dorsal Hand Severe depletion  Patellar Region Severe depletion  Anterior Thigh Region Severe depletion  Posterior Calf Region Severe depletion  Edema (RD Assessment) None  Hair Reviewed  Eyes Reviewed  Mouth Reviewed  Skin Reviewed  Nails Reviewed   Diet Order:   Diet Order             DIET - DYS 1 Room service appropriate? Yes with Assist; Fluid consistency: Thin  Diet effective now                  EDUCATION NEEDS:   Education needs have been addressed  Skin:  Skin Assessment: Reviewed RN Assessment (ecchymosis, MASD)  Last BM:  10/19- type 7  Height:   Ht Readings from Last 1 Encounters:  08/22/21 5' 7"  (1.702 m)    Weight:   Wt Readings from Last 1 Encounters:  08/29/21 63.8 kg    Ideal Body Weight:  67 kg  BMI:  Body mass index is 22.03 kg/m.  Estimated Nutritional Needs:   Kcal:  1700-1900kcal/day  Protein:  85-95g/day  Fluid:  1.6-1.9L/day  Koleen Distance MS, RD, LDN Please refer to [facility] for RD and/or RD on-call/weekend/after hours pager

## 2021-08-30 NOTE — Progress Notes (Signed)
PROGRESS NOTE    Jason Powers  MEQ:[id number] DOB: [birth year] DOA: 08/21/2021 PCP: Crecencio Mc, MD   Brief Narrative:  The patient is a 87 year old Caucasian male with a past medical history significant for but not limited to COPD, hypertension, iron deficiency anemia, BPH, history of thoracic compression fracture who presented with concerns of a fall.  Patient reported that he was sitting on the toilet when he stood and fell.  Otherwise he appears to have dementia and is unable to provide subjective history.  Imaging was negative with a head CT, cervical spine CT showed a C3 on C4 subluxation with possible ligamentous injury and MRI of the C-spine was done and showed no acute abnormality of the cervical spine but there is mild spinal canal stenosis at C3-C4 and severe left C4 neural foraminal stenosis. CT of the chest showed no PE but did show compression fracture of T9-T12 that was unchanged.  Findings were concerning for chronic outlet obstruction and CT of the abdomen pelvis showed early acute diverticulitis without abscess.  He was recently discharged from the hospital 08/11/2021 after treatment for acute sigmoid diverticulitis and acute on chronic anemia secondary to diverticular bleed that required transfusion of blood.  He was discharged on a 5-day course of Flagyl and then presented to the hospital after a fall at home.  Subsequently he was found to have sepsis secondary to acute sigmoid diverticulitis again.  Assessment & Plan:   Principal Problem:   Sepsis ([place]) Active Problems:   Diverticulitis   Hypotension   Hypokalemia   Cervical stenosis of spinal canal   Protein-calorie malnutrition, severe  Sepsis secondary to acute sigmoid diverticulitis with associated leukocytosis -WBC was trending up and Dr. Mal Misty the case was discussed with Dr. Ola Spurr with concern for C. difficile -Dr. Ola Spurr recommended switching from Augmentin to IV ceftriaxone and Flagyl -Of note the  patient was discharged on 08/11/2021 after hospitalization for acute diverticulitis and diverticular bleed -WBC went from 14.4 and trended up to 19.4 now 19.0 -Continue to monitor and trend and may need ID official assistance of continues to trend upward or reimaging -Patient states that he has intermittent diarrhea -Continue IV fluid hydration  Dehydration with hypotension and sinus tachycardia in the setting of sepsis -Blood pressure still on the lower side -Avoid antihypertensives and continue IV fluid hydration  Hypokalemia -Patient has a intermittent diarrhea -potassium is now improved and stable at four-point  Status post mechanical fall -Noted to have cervical spine stenosis and old T9 and T12 compression fractures -PT OT evaluating and recommending SNF  CKD stage IIIa -Chronic and better than baseline with no evidence of AKI -Patient's BUNs/creatinine is now 17/0.95 -Avoid further nephrotoxic medications, contrast dyes, hypotension renally dose medications -Continue monitor and trend and repeat CMP in a.m.  BPH with urinary retention -Has been able to pass urine spontaneously since his Foley catheter removed on 08/25/2021 -Continue tamsulosin  Debility and generalized weakness with unsteady gait -PT OT recommending SNF  COPD -Stable and has no issues with breathing -Continue monitor respiratory status carefully  Iron deficiency anemia/macrocytic anemia -Patient's hemoglobin/hematocrit went from 10.2/32.5 with an MCV of 106.9 -Check anemia panel in a.m. -Continue monitor for signs and symptoms bleeding; no overt bleeding noted -Repeat CBC in a.m.   Severe malnutrition in the context of chronic illness -Nutritionist consulted for further evaluation recommendations and recommending Ensure Enlive p.o. 3 times daily, Magic cup 3 times daily with meals, multivitamin with p.o. daily -Patient is at high risk for  refeeding syndrome so we will continue monitor his potassium,  magnesium and phosphorus daily until stable  DVT prophylaxis: Enoxaparin 40 mg subcu every 24 Code Status: FULL CODE Family Communication: No family currently at bedside Disposition Plan: Anticipating discharge to SNF in the next 24 to 48 hours  Status is: Inpatient  Remains inpatient appropriate because: Treatment of his diverticulitis and have to ensure that his white blood cell count improved prior to safely discharging and ensure that he does not have C. difficile  Consultants:  Infectious disease was informally consulted by Dr. Rosanne Gutting   Procedures:   Antimicrobials: Anti-infectives (From admission, onward)    Start     Dose/Rate Route Frequency Ordered Stop   08/29/21 1800  metroNIDAZOLE (FLAGYL) tablet 500 mg        500 mg Oral Every 8 hours 08/29/21 1542     08/29/21 1700  cefTRIAXone (ROCEPHIN) 1 g in sodium chloride 0.9 % 100 mL IVPB        1 g 200 mL/hr over 30 Minutes Intravenous Every 24 hours 08/29/21 1542     08/27/21 1000  amoxicillin-clavulanate (AUGMENTIN) 875-125 MG per tablet 1 tablet  Status:  Discontinued        1 tablet Oral Every 12 hours 08/27/21 0753 08/29/21 1542   08/22/21 0600  piperacillin-tazobactam (ZOSYN) IVPB 3.375 g  Status:  Discontinued        3.375 g 12.5 mL/hr over 240 Minutes Intravenous Every 8 hours 08/22/21 0215 08/27/21 0753   08/21/21 2245  piperacillin-tazobactam (ZOSYN) IVPB 3.375 g        3.375 g 100 mL/hr over 30 Minutes Intravenous  Once 08/21/21 2235 08/21/21 2333        Subjective: Seen and examined at bedside and was having some diarrhea intermittently but none today.  No nausea or vomiting.  Felt okay.  Had some multiple bruising on his arms.  Denies any chest pain or shortness breath.  No other concerns or complaints this time.  Objective: Vitals:   08/30/21 0801 08/30/21 1148 08/30/21 1603 08/30/21 1957  BP: 102/63 110/80 (!) 96/53 (!) 94/54  Pulse: 74 79 93 87  Resp: 16 18 17 18   Temp: 98.1 F (36.7 C) 98.2 F (36.8  C) 98.1 F (36.7 C) 97.6 F (36.4 C)  TempSrc: Oral Oral Oral   SpO2: 94% 98% 95% 99%  Weight:      Height:        Intake/Output Summary (Last 24 hours) at 08/30/2021 2114 Last data filed at 08/30/2021 1837 Gross per 24 hour  Intake 820 ml  Output 300 ml  Net 520 ml   Filed Weights   08/22/21 2015 08/27/21 1700 08/29/21 0611  Weight: 61 kg 64.5 kg 63.8 kg  Thank Examination: Physical Exam:  Constitutional: [place] ill-appearing Caucasian male currently in no acute distress appears calm but slightly uncomfortable Eyes: Lids and conjunctivae normal, sclerae anicteric  ENMT: External Ears, Nose appear normal. Grossly normal hearing. Mucous membranes are moist.  Neck: Appears normal, supple, no cervical masses, normal ROM, no appreciable thyromegaly; no JVD Respiratory: Diminished to auscultation bilaterally, no wheezing, rales, rhonchi or crackles. Normal respiratory effort and patient is not tachypenic. No accessory muscle use.  Unlabored breathing Cardiovascular: RRR, no murmurs / rubs / gallops. S1 and S2 auscultated.  Mild extremity edema Abdomen: Soft, non-tender, non-distended. Bowel sounds positive.  GU: Deferred. Musculoskeletal: No clubbing / cyanosis of digits/nails. No joint deformity upper and lower extremities.  Skin: Has some bruising and ecchymosis  noted to upper extremities. No induration; Warm and dry.  Neurologic: CN 2-12 grossly intact with no focal deficits. Romberg sign and cerebellar reflexes not assessed.  Psychiatric: Normal judgment and insight. Alert and oriented x 3. Normal mood and appropriate affect.   Data Reviewed: I have personally reviewed following labs and imaging studies  CBC: Recent Labs  Lab 08/26/21 0643 08/27/21 0604 08/28/21 0434 08/29/21 0440 08/30/21 0957  WBC 16.6* 15.5* 14.4* 19.4* 19.0*  NEUTROABS 14.4* 12.8* 11.9* 16.5* 16.2*  HGB 9.9* 10.3* 9.8* 9.5* 10.2*  HCT 30.2* 32.1* 29.7* 29.6* 32.5*  MCV 107.5* 107.4* 106.1* 105.3*  106.9*  PLT 216 234 255 259 585   Basic Metabolic Panel: Recent Labs  Lab 08/26/21 0643 08/27/21 0604 08/28/21 0434 08/29/21 0440 08/30/21 0957  NA 136 133* 134* 134* 135  K 3.5 4.6 3.8 4.3 4.0  CL 100 101 97* 100 96*  CO2 31 29 33* 27 30  GLUCOSE 123* 120* 126* 125* 193*  BUN 16 18 20 19 17   CREATININE 0.87 0.98 0.98 0.85 0.95  CALCIUM 7.8* 7.6* 7.9* 7.5* 7.9*  MG  --  1.7  --  1.8 1.9  PHOS  --   --   --   --  2.8   GFR: Estimated Creatinine Clearance: 39.2 mL/min (by C-G formula based on SCr of 0.95 mg/dL). Liver Function Tests: Recent Labs  Lab 08/30/21 0957  AST 22  ALT 13  ALKPHOS 49  BILITOT 0.5  PROT 5.4*  ALBUMIN 2.3*   No results for input(s): LIPASE, AMYLASE in the last 168 hours. No results for input(s): AMMONIA in the last 168 hours. Coagulation Profile: No results for input(s): INR, PROTIME in the last 168 hours. Cardiac Enzymes: No results for input(s): CKTOTAL, CKMB, CKMBINDEX, TROPONINI in the last 168 hours. BNP (last 3 results) No results for input(s): PROBNP in the last 8760 hours. HbA1C: No results for input(s): HGBA1C in the last 72 hours. CBG: No results for input(s): GLUCAP in the last 168 hours. Lipid Profile: No results for input(s): CHOL, HDL, LDLCALC, TRIG, CHOLHDL, LDLDIRECT in the last 72 hours. Thyroid Function Tests: No results for input(s): TSH, T4TOTAL, FREET4, T3FREE, THYROIDAB in the last 72 hours. Anemia Panel: No results for input(s): VITAMINB12, FOLATE, FERRITIN, TIBC, IRON, RETICCTPCT in the last 72 hours. Sepsis Labs: No results for input(s): PROCALCITON, LATICACIDVEN in the last 168 hours.  Recent Results (from the past 240 hour(s))  Resp Panel by RT-PCR (Flu A&B, Covid) Nasopharyngeal Swab     Status: None   Collection Time: 08/21/21  7:59 PM   Specimen: Nasopharyngeal Swab; Nasopharyngeal(NP) swabs in vial transport medium  Result Value Ref Range Status   SARS Coronavirus 2 by RT PCR NEGATIVE NEGATIVE Final     Comment: (NOTE) SARS-CoV-2 target nucleic acids are NOT DETECTED.  The SARS-CoV-2 RNA is generally detectable in upper respiratory specimens during the acute phase of infection. The lowest concentration of SARS-CoV-2 viral copies this assay can detect is 138 copies/mL. A negative result does not preclude SARS-Cov-2 infection and should not be used as the sole basis for treatment or other patient management decisions. A negative result may occur with  improper specimen collection/handling, submission of specimen other than nasopharyngeal swab, presence of viral mutation(s) within the areas targeted by this assay, and inadequate number of viral copies(<138 copies/mL). A negative result must be combined with clinical observations, patient history, and epidemiological information. The expected result is Negative.  Fact Sheet for Patients:  EntrepreneurPulse.com.au  Fact Sheet for Healthcare Providers:  IncredibleEmployment.be  This test is no t yet approved or cleared by the [place] FDA and  has been authorized for detection and/or diagnosis of SARS-CoV-2 by FDA under an Emergency Use Authorization (EUA). This EUA will remain  in effect (meaning this test can be used) for the duration of the COVID-19 declaration under Section 564(b)(1) of the Act, 21 U.S.C.section 360bbb-3(b)(1), unless the authorization is terminated  or revoked sooner.       Influenza A by PCR NEGATIVE NEGATIVE Final   Influenza B by PCR NEGATIVE NEGATIVE Final    Comment: (NOTE) The Xpert Xpress SARS-CoV-2/FLU/RSV plus assay is intended as an aid in the diagnosis of influenza from Nasopharyngeal swab specimens and should not be used as a sole basis for treatment. Nasal washings and aspirates are unacceptable for Xpert Xpress SARS-CoV-2/FLU/RSV testing.  Fact Sheet for Patients: EntrepreneurPulse.com.au  Fact Sheet for Healthcare  Providers: IncredibleEmployment.be  This test is not yet approved or cleared by the [place] FDA and has been authorized for detection and/or diagnosis of SARS-CoV-2 by FDA under an Emergency Use Authorization (EUA). This EUA will remain in effect (meaning this test can be used) for the duration of the COVID-19 declaration under Section 564(b)(1) of the Act, 21 U.S.C. section 360bbb-3(b)(1), unless the authorization is terminated or revoked.  Performed at [facility], [address] [place], [place] [address]   Urine Culture     Status: None   Collection Time: 08/21/21  7:59 PM   Specimen: Urine, Random  Result Value Ref Range Status   Specimen Description   Final    URINE, RANDOM Performed at [facility], [address] [place], [place] [address]    Special Requests   Final    NONE Performed at [facility], [address], [place], [place] [address]    Culture   Final    NO GROWTH Performed at [facility], [place] [address] [place], [place] [address]    Report Status 08/22/2021 FINAL  Final  Blood culture (routine x 2)     Status: None   Collection Time: 08/21/21 10:34 PM   Specimen: BLOOD  Result Value Ref Range Status   Specimen Description BLOOD LEFT ASSIST CONTROL  Final   Special Requests   Final    BOTTLES DRAWN AEROBIC AND ANAEROBIC Blood Culture adequate volume   Culture   Final    NO GROWTH 5 DAYS Performed at [facility], [address] [place], [place] [address]    Report Status 08/26/2021 FINAL  Final  Blood culture (routine x 2)     Status: None   Collection Time: 08/21/21 10:50 PM   Specimen: BLOOD  Result Value Ref Range Status   Specimen Description BLOOD RIGHT FOREARM  Final   Special Requests   Final    BOTTLES DRAWN AEROBIC AND ANAEROBIC Blood Culture results may not be optimal due to an inadequate volume of blood received in culture bottles   Culture   Final    NO GROWTH  5 DAYS Performed at [facility], [address] [place],  [address]    Report Status 08/26/2021 FINAL  Final    RN Pressure Injury Documentation:     Estimated body mass index is 22.03 kg/m as calculated from the following:   Height as of this encounter: 5\' 7"  (1.702 m).   Weight as of this encounter: 63.8 kg.  Malnutrition Type:  Nutrition  Problem: Severe Malnutrition Etiology: chronic illness (COPD, advanced age)  Malnutrition Characteristics:  Signs/Symptoms: severe fat depletion, severe muscle depletion  Nutrition Interventions:    Radiology Studies: No results found.  Scheduled Meds:  Chlorhexidine Gluconate Cloth  6 each Topical Daily   enoxaparin (LOVENOX) injection  40 mg Subcutaneous Q24H   feeding supplement  237 mL Oral TID BM   fluticasone  1 spray Each Nare Daily   metroNIDAZOLE  500 mg Oral Q8H   [START ON 08/31/2021] multivitamin with minerals  1 tablet Oral Daily   saccharomyces boulardii  250 mg Oral BID   tamsulosin  0.4 mg Oral Daily   Continuous Infusions:  sodium chloride 500 mL (08/30/21 1715)   cefTRIAXone (ROCEPHIN)  IV 1 g (08/30/21 1716)    LOS: 9 days   Kerney Elbe, DO [facility] PAGER is on [place]  If 7PM-7AM, please contact night-coverage [email or web address]

## 2021-08-31 DIAGNOSIS — K5792 Diverticulitis of intestine, part unspecified, without perforation or abscess without bleeding: Secondary | ICD-10-CM | POA: Diagnosis not present

## 2021-08-31 DIAGNOSIS — M4802 Spinal stenosis, cervical region: Secondary | ICD-10-CM | POA: Diagnosis not present

## 2021-08-31 DIAGNOSIS — E876 Hypokalemia: Secondary | ICD-10-CM | POA: Diagnosis not present

## 2021-08-31 DIAGNOSIS — A419 Sepsis, unspecified organism: Secondary | ICD-10-CM | POA: Diagnosis not present

## 2021-08-31 LAB — MAGNESIUM: Magnesium: 1.7 mg/dL (ref 1.7–2.4)

## 2021-08-31 LAB — CBC WITH DIFFERENTIAL/PLATELET
Abs Immature Granulocytes: 0.71 10*3/uL — ABNORMAL HIGH (ref 0.00–0.07)
Basophils Absolute: 0.1 10*3/uL (ref 0.0–0.1)
Basophils Relative: 1 %
Eosinophils Absolute: 0.4 10*3/uL (ref 0.0–0.5)
Eosinophils Relative: 2 %
HCT: 29.4 % — ABNORMAL LOW (ref 39.0–52.0)
Hemoglobin: 9.7 g/dL — ABNORMAL LOW (ref 13.0–17.0)
Immature Granulocytes: 4 %
Lymphocytes Relative: 4 %
Lymphs Abs: 0.6 10*3/uL — ABNORMAL LOW (ref 0.7–4.0)
MCH: 35 pg — ABNORMAL HIGH (ref 26.0–34.0)
MCHC: 33 g/dL (ref 30.0–36.0)
MCV: 106.1 fL — ABNORMAL HIGH (ref 80.0–100.0)
Monocytes Absolute: 1.2 10*3/uL — ABNORMAL HIGH (ref 0.1–1.0)
Monocytes Relative: 8 %
Neutro Abs: 13.1 10*3/uL — ABNORMAL HIGH (ref 1.7–7.7)
Neutrophils Relative %: 81 %
Platelets: 255 10*3/uL (ref 150–400)
RBC: 2.77 MIL/uL — ABNORMAL LOW (ref 4.22–5.81)
RDW: 17.3 % — ABNORMAL HIGH (ref 11.5–15.5)
WBC: 16.1 10*3/uL — ABNORMAL HIGH (ref 4.0–10.5)
nRBC: 0 % (ref 0.0–0.2)

## 2021-08-31 LAB — COMPREHENSIVE METABOLIC PANEL
ALT: 12 U/L (ref 0–44)
AST: 12 U/L — ABNORMAL LOW (ref 15–41)
Albumin: 2 g/dL — ABNORMAL LOW (ref 3.5–5.0)
Alkaline Phosphatase: 46 U/L (ref 38–126)
Anion gap: 5 (ref 5–15)
BUN: 14 mg/dL (ref 8–23)
CO2: 30 mmol/L (ref 22–32)
Calcium: 7.8 mg/dL — ABNORMAL LOW (ref 8.9–10.3)
Chloride: 100 mmol/L (ref 98–111)
Creatinine, Ser: 0.79 mg/dL (ref 0.61–1.24)
GFR, Estimated: >60 mL/min (ref >60)
Glucose, Bld: 110 mg/dL — ABNORMAL HIGH (ref 70–99)
Potassium: 3.8 mmol/L (ref 3.5–5.1)
Sodium: 135 mmol/L (ref 135–145)
Total Bilirubin: 0.6 mg/dL (ref 0.3–1.2)
Total Protein: 4.8 g/dL — ABNORMAL LOW (ref 6.5–8.1)

## 2021-08-31 LAB — RESP PANEL BY RT-PCR (FLU A&B, COVID) ARPGX2
Influenza A by PCR: NEGATIVE
Influenza B by PCR: NEGATIVE
SARS Coronavirus 2 by RT PCR: NEGATIVE

## 2021-08-31 LAB — PHOSPHORUS: Phosphorus: 2.6 mg/dL (ref 2.5–4.6)

## 2021-08-31 MED ORDER — CIPROFLOXACIN HCL 500 MG PO TABS: 500.0000 mg | ORAL_TABLET | Freq: Two times a day (BID) | ORAL | 0 refills | Status: AC

## 2021-08-31 MED ORDER — ADULT MULTIVITAMIN W/MINERALS CH: 1.0000 | ORAL_TABLET | Freq: Every day | ORAL | 0 refills | Status: DC

## 2021-08-31 MED ORDER — SACCHAROMYCES BOULARDII 250 MG PO CAPS: 250.0000 mg | ORAL_CAPSULE | Freq: Two times a day (BID) | ORAL | 0 refills | Status: DC

## 2021-08-31 MED ORDER — ENSURE ENLIVE PO LIQD: 237.0000 mL | Freq: Three times a day (TID) | ORAL | 12 refills | Status: DC

## 2021-08-31 MED ORDER — METRONIDAZOLE 500 MG PO TABS: 500.0000 mg | ORAL_TABLET | Freq: Three times a day (TID) | ORAL | 0 refills | Status: AC

## 2021-08-31 MED ORDER — POTASSIUM CHLORIDE CRYS ER 20 MEQ PO TBCR
40.0000 meq | EXTENDED_RELEASE_TABLET | Freq: Once | ORAL | Status: AC
  Administered 2021-08-31: 40 meq via ORAL
  Filled 2021-08-31: qty 2

## 2021-08-31 MED ORDER — MAGNESIUM SULFATE 2 GM/50ML IV SOLN
2.0000 g | Freq: Once | INTRAVENOUS | Status: AC
  Administered 2021-08-31: 2 g via INTRAVENOUS
  Filled 2021-08-31: qty 50

## 2021-08-31 MED ORDER — SALINE SPRAY 0.65 % NA SOLN: 1.0000 | NASAL | 0 refills | Status: DC | PRN

## 2021-08-31 NOTE — Discharge Summary (Signed)
Physician Discharge Summary  Kamuela Magos NOI:[id number] DOB: 16-Aug-1921 DOA: 08/21/2021  PCP: Crecencio Mc, MD  Admit date: 08/21/2021 Discharge date: 08/31/2021  Admitted From: Home Disposition: SNF  Recommendations for Outpatient Follow-up:  Follow up with PCP in 1-2 weeks Please obtain CMP/CBC, Mag, Phos in one week Please follow up on the following pending results:  Home Health: No  Equipment/Devices: None  Discharge Condition: Stable  CODE STATUS: FULL CODE  Diet recommendation:   Brief/Interim Summary: The patient is a 87 year old Caucasian male with a past medical history significant for but not limited to COPD, hypertension, iron deficiency anemia, BPH, history of thoracic compression fracture who presented with concerns of a fall.  Patient reported that he was sitting on the toilet when he stood and fell.  Otherwise he appears to have dementia and is unable to provide subjective history.  Imaging was negative with a head CT, cervical spine CT showed a C3 on C4 subluxation with possible ligamentous injury and MRI of the C-spine was done and showed no acute abnormality of the cervical spine but there is mild spinal canal stenosis at C3-C4 and severe left C4 neural foraminal stenosis. CT of the chest showed no PE but did show compression fracture of T9-T12 that was unchanged.  Findings were concerning for chronic outlet obstruction and CT of the abdomen pelvis showed early acute diverticulitis without abscess.  He was recently discharged from the hospital 08/11/2021 after treatment for acute sigmoid diverticulitis and acute on chronic anemia secondary to diverticular bleed that required transfusion of blood.  He was discharged on a 5-day course of Flagyl and then presented to the hospital after a fall at home.  Subsequently he was found to have sepsis secondary to acute sigmoid diverticulitis again and he was placed on IV Zosyn with improvement currently transition to p.o. Augmentin  he started worsening so he was changed back to IV ceftriaxone and Flagyl and is improved. He is stable to D/C SNF at this time and will need to follow up with PCP within 1-2 weeks.   Discharge Diagnoses:  Principal Problem:   Sepsis ([place]) Active Problems:   Diverticulitis   Hypotension   Hypokalemia   Cervical stenosis of spinal canal   Protein-calorie malnutrition, severe  Sepsis secondary to acute sigmoid diverticulitis with associated leukocytosis, improving  -WBC was trending up and Dr. Mal Misty the case was discussed with Dr. Ola Spurr with concern for C. difficile -Dr. Ola Spurr recommended switching from Augmentin to IV ceftriaxone and Flagyl and will change to po Cipro/Flagyl at D/C -Of note the patient was discharged on 08/11/2021 after hospitalization for acute diverticulitis and diverticular bleed -WBC went from 14.4 and trended up to 19.4 now 19.0 -> 16.1 -Continue to monitor and trend and may need ID official assistance of continues to trend upward or reimaging -Patient states that he has intermittent diarrhea -Continue IV fluid hydration   Dehydration with hypotension and sinus tachycardia in the setting of sepsis -Blood pressure still on the lower side at 99/58 -Avoid antihypertensives and will discontinue continue IV fluid hydration   Hypokalemia -Patient has a intermittent diarrhea and improved  -potassium is now improved and stable at 3.8   Status post mechanical fall -Noted to have cervical spine stenosis and old T9 and T12 compression fractures -He denies any back pain -PT OT evaluating and recommending SNF   CKD stage IIIa -Chronic and better than baseline with no evidence of AKI -Patient's BUNs/creatinine is now 17/0.95 -> 14/0.79 -Avoid further nephrotoxic medications,  contrast dyes, hypotension renally dose medications -Continue monitor and trend and repeat CMP in a.m.   BPH with urinary retention -Has been able to pass urine spontaneously since his  Foley catheter removed on 08/25/2021 -Continue Tamsulosin   Debility and generalized weakness with unsteady gait -PT OT recommending SNF   COPD -Stable and has no issues with breathing -Continue monitor respiratory status carefully   Iron deficiency anemia/macrocytic anemia -Patient's hemoglobin/hematocrit stable at 9.7/29.4 with an MCV of 106.1 -Check anemia panel in a.m. -Continue monitor for signs and symptoms bleeding; no overt bleeding noted -Repeat CBC in a.m.   Severe malnutrition in the context of chronic illness -Nutritionist consulted for further evaluation recommendations and recommending Ensure Enlive p.o. 3 times daily, Magic cup 3 times daily with meals, multivitamin with p.o. daily -Patient is at high risk for refeeding syndrome so we will continue monitor his potassium, magnesium and phosphorus daily until stable  Discharge Instructions  Discharge Instructions     Call MD for:  difficulty breathing, headache or visual disturbances   Complete by: As directed    Call MD for:  extreme fatigue   Complete by: As directed    Call MD for:  hives   Complete by: As directed    Call MD for:  persistant dizziness or light-headedness   Complete by: As directed    Call MD for:  persistant nausea and vomiting   Complete by: As directed    Call MD for:  redness, tenderness, or signs of infection (pain, swelling, redness, odor or green/yellow discharge around incision site)   Complete by: As directed    Call MD for:  severe uncontrolled pain   Complete by: As directed    Call MD for:  temperature >100.4   Complete by: As directed    Diet - low sodium heart healthy   Complete by: As directed    Dysphagia 2 Diet with Thin Liquids   Discharge instructions   Complete by: As directed    You were cared for by a hospitalist during your hospital stay. If you have any questions about your discharge medications or the care you received while you were in the hospital after you are  discharged, you can call the unit and ask to speak with the hospitalist on call if the hospitalist that took care of you is not available. Once you are discharged, your primary care physician will handle any further medical issues. Please note that NO REFILLS for any discharge medications will be authorized once you are discharged, as it is imperative that you return to your primary care physician (or establish a relationship with a primary care physician if you do not have one) for your aftercare needs so that they can reassess your need for medications and monitor your lab values.  Follow up with PCP and GI within 1-2 weeks. Take all medications as prescribed. If symptoms change or worsen please return to the ED for evaluation   Discharge wound care:   Complete by: As directed    Keep Clean and Dry   Increase activity slowly   Complete by: As directed       Allergies as of 08/31/2021   No Known Allergies      Medication List     STOP taking these medications    hydrochlorothiazide 25 MG tablet Commonly known as: HYDRODIURIL       TAKE these medications    Acetaminophen 325 MG Caps Take 2 capsules by mouth every 6 (  six) hours as needed.   brinzolamide 1 % ophthalmic suspension Commonly known as: AZOPT Place 1 drop into both eyes 2 (two) times daily.   ciprofloxacin 500 MG tablet Commonly known as: Cipro Take 1 tablet (500 mg total) by mouth 2 (two) times daily for 3 days.   feeding supplement Liqd Take 237 mLs by mouth 3 (three) times daily between meals.   ferrous fumarate-iron polysaccharide complex 162-115.2 MG Caps capsule Commonly known as: TANDEM Take 1 capsule by mouth daily with breakfast.   fluticasone 50 MCG/ACT nasal spray Commonly known as: FLONASE Place 2 sprays into both nostrils daily.   magnesium hydroxide 400 MG/5ML suspension Commonly known as: MILK OF MAGNESIA Take by mouth daily as needed for mild constipation.   metroNIDAZOLE 500 MG  tablet Commonly known as: Flagyl Take 1 tablet (500 mg total) by mouth 3 (three) times daily for 3 days.   multivitamin with minerals Tabs tablet Take 1 tablet by mouth daily. Start taking on: September 01, 2021   saccharomyces boulardii 250 MG capsule Commonly known as: FLORASTOR Take 1 capsule (250 mg total) by mouth 2 (two) times daily.   sodium chloride 0.65 % Soln nasal spray Commonly known as: OCEAN Place 1 spray into both nostrils as needed for congestion.   tamsulosin 0.4 MG Caps capsule Commonly known as: FLOMAX Take 1 capsule (0.4 mg total) by mouth daily.               Discharge Care Instructions  (From admission, onward)           Start     Ordered   08/31/21 0000  Discharge wound care:       Comments: Keep Clean and Dry   08/31/21 1520           No Known Allergies  Consultations: Infectious disease was informally consulted by Dr. Rosanne Gutting   Procedures/Studies: [place] (5MM)  Result Date: 08/21/2021 CLINICAL DATA:  Neck trauma after a fall. Struck head without loss of consciousness. Back pain and headache. EXAM: CT HEAD WITHOUT CONTRAST CT CERVICAL SPINE WITHOUT CONTRAST TECHNIQUE: Multidetector CT imaging of the head and cervical spine was performed following the standard protocol without intravenous contrast. Multiplanar CT image reconstructions of the cervical spine were also generated. COMPARISON:  None. FINDINGS: CT HEAD FINDINGS Brain: Diffuse cerebral atrophy. Ventricular dilatation consistent with central atrophy. Low-attenuation changes in the deep white matter consistent with small vessel ischemia. No abnormal extra-axial fluid collections. No mass effect or midline shift. Gray-white matter junctions are distinct. Basal cisterns are not effaced. No acute intracranial hemorrhage. Vascular: Mild intracranial arterial vascular calcifications. Skull: Calvarium appears intact. Sinuses/Orbits: Paranasal sinuses and mastoid air cells are  clear. Other: None. CT CERVICAL SPINE FINDINGS Alignment: 4 mm anterior subluxation of C3 on C4. Normal alignment of the facet joints. This could represent degenerative change but ligamentous injury may be present. Skull base and vertebrae: Skull base appears intact. No vertebral compression deformities. No focal bone lesion or bone destruction. Bone cortex appears intact. Soft tissues and spinal canal: No prevertebral soft tissue swelling. No abnormal paraspinal soft tissue mass or infiltration. Disc levels: Degenerative changes throughout with narrowed disc spaces and endplate osteophyte formation. Degenerative changes in the facet joints. Uncovertebral spurring and facet joint hypertrophy cause encroachment upon the neural foramina bilaterally. Upper chest: Calcification and scarring in the lung apices, greater on the right. Emphysematous changes in the lung apices. Other: None. IMPRESSION: 1. No acute intracranial abnormalities. Chronic atrophy  and small vessel ischemic changes. 2. Mild anterior subluxation of C3 on C4, possibly degenerative but ligamentous injury could be present. Consider MRI to assess for ligamentous injury. No acute fractures are identified. Prominent degenerative changes throughout the cervical spine. Electronically Signed   By: Lucienne Capers M.D.   On: 08/21/2021 22:03   CT Angio Chest PE W and/or Wo Contrast  Result Date: 08/21/2021 CLINICAL DATA:  Abdominal distention after a fall. Pulmonary embolus is suspected with high probability. EXAM: CT ANGIOGRAPHY CHEST CT ABDOMEN AND PELVIS WITH CONTRAST TECHNIQUE: Multidetector CT imaging of the chest was performed using the standard protocol during bolus administration of intravenous contrast. Multiplanar CT image reconstructions and MIPs were obtained to evaluate the vascular anatomy. Multidetector CT imaging of the abdomen and pelvis was performed using the standard protocol during bolus administration of intravenous contrast.  CONTRAST:  82mL OMNIPAQUE IOHEXOL 350 MG/ML SOLN COMPARISON:  CTA abdomen and pelvis 08/10/2021 FINDINGS: CTA CHEST FINDINGS Cardiovascular: Good opacification of the central and segmental pulmonary arteries. No focal filling defects. No evidence of significant pulmonary embolus. Normal heart size. No pericardial effusions. Normal caliber thoracic aorta. Scattered aortic calcification. No aortic dissection. Great vessel origins are patent. Mediastinum/Nodes: Thyroid gland is unremarkable. Esophagus is decompressed. No significant lymphadenopathy. Lungs/Pleura: Prominent diffuse emphysematous changes throughout the lungs. Scattered calcified granulomas. Fibrocalcific changes in the lung apices particularly on the right consistent with postinflammatory change. No airspace disease or consolidation. Mild dependent atelectasis. No pleural effusions. No pneumothorax. Musculoskeletal: Compression fractures at T12 and T9 vertebra. Lesions were present on prior thoracic spine radiograph from 06/13/2021 and prior MRI from 06/28/2021. Retropulsion of fracture fragments at T12 is again demonstrated, unchanged. Sternum and ribs are nondisplaced. Review of the MIP images confirms the above findings. CT ABDOMEN and PELVIS FINDINGS Hepatobiliary: No focal liver abnormality is seen. No gallstones, gallbladder wall thickening, or biliary dilatation. Pancreas: Unremarkable. No pancreatic ductal dilatation or surrounding inflammatory changes. Spleen: Normal in size without focal abnormality. Adrenals/Urinary Tract: No adrenal gland nodules. Mild renal parenchymal atrophy. Nephrograms are symmetrical. 2 mm stone in the lower pole left kidney. No hydronephrosis or hydroureter. Diffuse bladder wall thickening with trabeculation and left posterior bladder wall diverticulum. Changes likely due to chronic outlet obstruction. Stomach/Bowel: Stomach, small bowel, and colon are not abnormally distended. Diverticulosis throughout the colon, most  prominent in the sigmoid region. There is mild stranding around the sigmoid colon which may indicate early changes of acute diverticulitis. No abscess. Prominent stool-filled rectum with mild rectal wall thickening could indicate stercoral colitis. Appendix is not identified. Vascular/Lymphatic: Aortic atherosclerosis. No enlarged abdominal or pelvic lymph nodes. Reproductive: Diffuse prostate enlargement. Other: No free air or free fluid in the abdomen. Abdominal wall musculature appears intact. Musculoskeletal: Degenerative changes in the spine. No destructive bone lesions. Review of the MIP images confirms the above findings. IMPRESSION: 1. No evidence of significant pulmonary embolus. 2. Diffuse emphysematous changes in the lungs. Postinflammatory calcifications. No airspace disease. 3. Diffuse aortic atherosclerosis. 4. Nonobstructing stone in the lower pole left kidney. Bilateral renal parenchymal atrophy. 5. Bladder wall thickening and trabeculation with bladder wall diverticulum. Prostate enlargement. Findings likely represent chronic outlet obstruction. 6. Compression fractures of T9 and T12 vertebral bodies, unchanged since prior studies. 7. Diverticulosis of the sigmoid colon with mild stranding in the pericolonic fat possibly indicating early acute diverticulitis. No abscess. Electronically Signed   By: Lucienne Capers M.D.   On: 08/21/2021 22:18   CT Cervical Spine Wo Contrast  Result Date: 08/21/2021  CLINICAL DATA:  Neck trauma after a fall. Struck head without loss of consciousness. Back pain and headache. EXAM: CT HEAD WITHOUT CONTRAST CT CERVICAL SPINE WITHOUT CONTRAST TECHNIQUE: Multidetector CT imaging of the head and cervical spine was performed following the standard protocol without intravenous contrast. Multiplanar CT image reconstructions of the cervical spine were also generated. COMPARISON:  None. FINDINGS: CT HEAD FINDINGS Brain: Diffuse cerebral atrophy. Ventricular dilatation  consistent with central atrophy. Low-attenuation changes in the deep white matter consistent with small vessel ischemia. No abnormal extra-axial fluid collections. No mass effect or midline shift. Gray-white matter junctions are distinct. Basal cisterns are not effaced. No acute intracranial hemorrhage. Vascular: Mild intracranial arterial vascular calcifications. Skull: Calvarium appears intact. Sinuses/Orbits: Paranasal sinuses and mastoid air cells are clear. Other: None. CT CERVICAL SPINE FINDINGS Alignment: 4 mm anterior subluxation of C3 on C4. Normal alignment of the facet joints. This could represent degenerative change but ligamentous injury may be present. Skull base and vertebrae: Skull base appears intact. No vertebral compression deformities. No focal bone lesion or bone destruction. Bone cortex appears intact. Soft tissues and spinal canal: No prevertebral soft tissue swelling. No abnormal paraspinal soft tissue mass or infiltration. Disc levels: Degenerative changes throughout with narrowed disc spaces and endplate osteophyte formation. Degenerative changes in the facet joints. Uncovertebral spurring and facet joint hypertrophy cause encroachment upon the neural foramina bilaterally. Upper chest: Calcification and scarring in the lung apices, greater on the right. Emphysematous changes in the lung apices. Other: None. IMPRESSION: 1. No acute intracranial abnormalities. Chronic atrophy and small vessel ischemic changes. 2. Mild anterior subluxation of C3 on C4, possibly degenerative but ligamentous injury could be present. Consider MRI to assess for ligamentous injury. No acute fractures are identified. Prominent degenerative changes throughout the cervical spine. Electronically Signed   By: Lucienne Capers M.D.   On: 08/21/2021 22:03   MR Cervical Spine Wo Contrast  Result Date: 08/22/2021 CLINICAL DATA:  Fall EXAM: MRI CERVICAL SPINE WITHOUT CONTRAST TECHNIQUE: Multiplanar, multisequence MR  imaging of the cervical spine was performed. No intravenous contrast was administered. COMPARISON:  None. FINDINGS: Alignment: Grade 1 anterolisthesis at C3-4 Vertebrae: No fracture, evidence of discitis, or bone lesion. Cord: Normal signal and morphology. Posterior Fossa, vertebral arteries, paraspinal tissues: Negative. Disc levels: Axial images are severely degraded by motion, limiting detailed assessment of the disc spaces. Within that limitation, there is severe left C3-4 neural foraminal stenosis with mild spinal canal stenosis IMPRESSION: 1. Severely motion degraded examination. 2. No acute abnormality of the cervical spine. 3. Mild spinal canal stenosis at C3-4. Severe left C4 neural foraminal stenosis. Electronically Signed   By: Ulyses Jarred M.D.   On: 08/22/2021 01:47   CT ABDOMEN PELVIS W CONTRAST  Result Date: 08/21/2021 CLINICAL DATA:  Abdominal distention after a fall. Pulmonary embolus is suspected with high probability. EXAM: CT ANGIOGRAPHY CHEST CT ABDOMEN AND PELVIS WITH CONTRAST TECHNIQUE: Multidetector CT imaging of the chest was performed using the standard protocol during bolus administration of intravenous contrast. Multiplanar CT image reconstructions and MIPs were obtained to evaluate the vascular anatomy. Multidetector CT imaging of the abdomen and pelvis was performed using the standard protocol during bolus administration of intravenous contrast. CONTRAST:  65mL OMNIPAQUE IOHEXOL 350 MG/ML SOLN COMPARISON:  CTA abdomen and pelvis 08/10/2021 FINDINGS: CTA CHEST FINDINGS Cardiovascular: Good opacification of the central and segmental pulmonary arteries. No focal filling defects. No evidence of significant pulmonary embolus. Normal heart size. No pericardial effusions. Normal caliber thoracic aorta. Scattered  aortic calcification. No aortic dissection. Great vessel origins are patent. Mediastinum/Nodes: Thyroid gland is unremarkable. Esophagus is decompressed. No significant  lymphadenopathy. Lungs/Pleura: Prominent diffuse emphysematous changes throughout the lungs. Scattered calcified granulomas. Fibrocalcific changes in the lung apices particularly on the right consistent with postinflammatory change. No airspace disease or consolidation. Mild dependent atelectasis. No pleural effusions. No pneumothorax. Musculoskeletal: Compression fractures at T12 and T9 vertebra. Lesions were present on prior thoracic spine radiograph from 06/13/2021 and prior MRI from 06/28/2021. Retropulsion of fracture fragments at T12 is again demonstrated, unchanged. Sternum and ribs are nondisplaced. Review of the MIP images confirms the above findings. CT ABDOMEN and PELVIS FINDINGS Hepatobiliary: No focal liver abnormality is seen. No gallstones, gallbladder wall thickening, or biliary dilatation. Pancreas: Unremarkable. No pancreatic ductal dilatation or surrounding inflammatory changes. Spleen: Normal in size without focal abnormality. Adrenals/Urinary Tract: No adrenal gland nodules. Mild renal parenchymal atrophy. Nephrograms are symmetrical. 2 mm stone in the lower pole left kidney. No hydronephrosis or hydroureter. Diffuse bladder wall thickening with trabeculation and left posterior bladder wall diverticulum. Changes likely due to chronic outlet obstruction. Stomach/Bowel: Stomach, small bowel, and colon are not abnormally distended. Diverticulosis throughout the colon, most prominent in the sigmoid region. There is mild stranding around the sigmoid colon which may indicate early changes of acute diverticulitis. No abscess. Prominent stool-filled rectum with mild rectal wall thickening could indicate stercoral colitis. Appendix is not identified. Vascular/Lymphatic: Aortic atherosclerosis. No enlarged abdominal or pelvic lymph nodes. Reproductive: Diffuse prostate enlargement. Other: No free air or free fluid in the abdomen. Abdominal wall musculature appears intact. Musculoskeletal: Degenerative  changes in the spine. No destructive bone lesions. Review of the MIP images confirms the above findings. IMPRESSION: 1. No evidence of significant pulmonary embolus. 2. Diffuse emphysematous changes in the lungs. Postinflammatory calcifications. No airspace disease. 3. Diffuse aortic atherosclerosis. 4. Nonobstructing stone in the lower pole left kidney. Bilateral renal parenchymal atrophy. 5. Bladder wall thickening and trabeculation with bladder wall diverticulum. Prostate enlargement. Findings likely represent chronic outlet obstruction. 6. Compression fractures of T9 and T12 vertebral bodies, unchanged since prior studies. 7. Diverticulosis of the sigmoid colon with mild stranding in the pericolonic fat possibly indicating early acute diverticulitis. No abscess. Electronically Signed   By: Lucienne Capers M.D.   On: 08/21/2021 22:18   CT T-SPINE NO CHARGE  Result Date: 08/21/2021 CLINICAL DATA:  Fall EXAM: CT THORACIC SPINE WITHOUT CONTRAST TECHNIQUE: Multidetector CT images of the thoracic were obtained using the standard protocol without intravenous contrast. COMPARISON:  CT abdomen pelvis 08/10/2021 Thoracic spine radiographs 06/13/2021 FINDINGS: Alignment: Normal. Vertebrae: Compression fracture at T12 with greater than 50% height loss and 9 mm of retropulsion is unchanged. There is also an old compression fracture of T9 with less than 25% height loss and no retropulsion. Paraspinal and other soft tissues: Emphysema and calcific atherosclerosis of the aorta. Disc levels: There is moderate spinal canal narrowing at the T12 level due to the retropulsed fragment. Otherwise, no spinal canal stenosis. IMPRESSION: 1. Unchanged appearance of T12 compression fracture with greater than 50% height loss and 9 mm of retropulsion resulting in moderate spinal canal narrowing. 2. Old compression fracture of T9 with less than 25% height loss and no retropulsion. Aortic Atherosclerosis (ICD10-I70.0). Electronically  Signed   By: Ulyses Jarred M.D.   On: 08/21/2021 22:06   CT L-SPINE NO CHARGE  Result Date: 08/21/2021 CLINICAL DATA:  Fall EXAM: CT LUMBAR SPINE WITHOUT CONTRAST TECHNIQUE: Multidetector CT imaging of the lumbar spine was  performed without intravenous contrast administration. Multiplanar CT image reconstructions were also generated. COMPARISON:  None. FINDINGS: Segmentation: 5 lumbar type vertebrae. Alignment: Normal. Vertebrae: No acute fracture or focal pathologic process. Paraspinal and other soft tissues: Calcific aortic atherosclerosis Disc levels: Multilevel degenerative disc disease and facet arthrosis. No spinal canal stenosis. No neural impingement. IMPRESSION: 1. No acute fracture or static subluxation of the lumbar spine. 2. Multilevel degenerative disc disease and facet arthrosis without spinal canal stenosis or neural impingement. 3. T12 compression fracture described on concomitant thoracic spine CT. Aortic Atherosclerosis (ICD10-I70.0). Electronically Signed   By: Ulyses Jarred M.D.   On: 08/21/2021 22:14   CT ANGIO GI BLEED  Result Date: 08/10/2021 CLINICAL DATA:  Generalized abdominal pain and diarrhea. Dark red stool. Concern for GI bleed. Mesenteric ischemia, acute EXAM: CTA ABDOMEN AND PELVIS WITHOUT AND WITH CONTRAST TECHNIQUE: Multidetector CT imaging of the abdomen and pelvis was performed using the standard protocol during bolus administration of intravenous contrast. Multiplanar reconstructed images and MIPs were obtained and reviewed to evaluate the vascular anatomy. CONTRAST:  12mL OMNIPAQUE IOHEXOL 350 MG/ML SOLN COMPARISON:  Lumbar spine x-ray 06/13/2021. FINDINGS: VASCULAR Aorta: Moderate calcified and noncalcified atherosclerotic plaque throughout the abdominal aorta without aneurysm or evidence of significant stenosis. Celiac: Atherosclerosis of the celiac trunk and its branches. Patent without evidence of aneurysm, dissection, vasculitis or significant stenosis. SMA: Mild  atherosclerosis. Patent without evidence of aneurysm, dissection, vasculitis or significant stenosis. Renals: Atherosclerosis at the bilateral renal artery ostia. Both renal arteries are patent without evidence of aneurysm, dissection, vasculitis, fibromuscular dysplasia or significant stenosis. IMA: Patent. Inflow: Moderate atherosclerotic plaque. Patent without evidence of aneurysm, dissection, vasculitis or significant stenosis. Proximal Outflow: Moderate atherosclerotic plaque. Bilateral common femoral and visualized portions of the superficial and profunda femoral arteries are patent without evidence of aneurysm, dissection, vasculitis or significant stenosis. Veins: Major venous structures are patent. Review of the MIP images confirms the above findings. NON-VASCULAR Lower chest: Emphysematous changes within the visualized lung bases. Heart size is normal. Relative hypoattenuation of the cardiac blood pool indicative of anemia. Hepatobiliary: No focal liver abnormality is seen. No gallstones, gallbladder wall thickening, or biliary dilatation. Pancreas: Unremarkable. No pancreatic ductal dilatation or surrounding inflammatory changes. Spleen: Normal in size without focal abnormality. Adrenals/Urinary Tract: Unremarkable adrenal glands. Kidneys are mildly atrophic. Small punctate calcification in the left renal pelvis, likely vascular calcification. No definite renal calculi. No hydronephrosis. Urinary bladder wall is mildly thickened. There is a diverticulum emanating laterally at the left side of the bladder wall. Stomach/Bowel: Stomach is within normal limits. No dilated loops of bowel to suggest obstruction. There is a focally inflamed diverticula within the mid sigmoid colon with adjacent fat stranding (series 6, image 61. No pericolonic fluid collection or abscess. No extraluminal air. Numerous additional diverticula throughout the colon. Appendix is not visualized, surgically absent by history.  Multiphasic images demonstrate no evidence of active GI bleed. No contrast is seen pooling within bowel. Lymphatic: No abdominopelvic lymphadenopathy. Reproductive: Markedly enlarged, heterogeneous prostate gland resulting in significant mass effect upon the base of the urinary bladder. Prostate gland measures approximately 6.0 x 5.1 x 5.8 cm. Other: No free fluid. No abdominopelvic fluid collection. No pneumoperitoneum. Small fat containing inguinal hernias. Musculoskeletal: Severe compression fracture of the T12 vertebral body with approximately 8 mm of retropulsion of the superior endplate likely resulting in at least moderate canal stenosis at this level (series 13, image 91). IMPRESSION: VASCULAR 1. No evidence of active GI bleed or large vessel  occlusion. 2. Moderate atherosclerotic disease throughout the aortoiliac axis without aneurysm or dissection. (ICD10-I70.0) NON-VASCULAR 1. Acute uncomplicated sigmoid diverticulitis. 2. Severe compression fracture of the T12 vertebral body with approximately 8 mm of retropulsion. This likely results in at least moderate canal stenosis at this level. Degree of vertebral body height loss has progressed from 06/13/2021. 3. Markedly enlarged, heterogeneous prostate gland resulting in significant mass effect upon the base of the urinary bladder. 4. Mildly thickened urinary bladder wall is likely secondary to chronic outlet obstruction. Electronically Signed   By: Davina Poke D.O.   On: 08/10/2021 10:34    Subjective: Seen and examined at bedside and he is doing fairly well.  Denied complaints or no chest pain or shortness of breath.  No nausea or vomiting.  No other concerns or complaints at this time.  He is medically stable to be discharged to SNF today.  Discharge Exam: Vitals:   08/31/21 1153 08/31/21 1155  BP: (!) 96/44 (!) 99/58  Pulse: 88   Resp: 17   Temp: 98 F (36.7 C)   SpO2: 99%    Vitals:   08/31/21 0205 08/31/21 0755 08/31/21 1153 08/31/21  1155  BP: (!) 98/56 (!) 100/59 (!) 96/44 (!) 99/58  Pulse: 72 75 88   Resp: 18 17 17    Temp: 98.6 F (37 C) 98.4 F (36.9 C) 98 F (36.7 C)   TempSrc:  Oral Oral   SpO2: 99% 99% 99%   Weight:      Height:       General: Pt is alert, awake, not in acute distress Cardiovascular: RRR, S1/S2 +, no rubs, no gallops Respiratory: Diminished bilaterally, no wheezing, no rhonchi; unlabored breathing Abdominal: Soft, NT, ND, bowel sounds + Extremities: no edema, no cyanosis  The results of significant diagnostics from this hospitalization (including imaging, microbiology, ancillary and laboratory) are listed below for reference.     Microbiology: Recent Results (from the past 240 hour(s))  Resp Panel by RT-PCR (Flu A&B, Covid) Nasopharyngeal Swab     Status: None   Collection Time: 08/21/21  7:59 PM   Specimen: Nasopharyngeal Swab; Nasopharyngeal(NP) swabs in vial transport medium  Result Value Ref Range Status   SARS Coronavirus 2 by RT PCR NEGATIVE NEGATIVE Final    Comment: (NOTE) SARS-CoV-2 target nucleic acids are NOT DETECTED.  The SARS-CoV-2 RNA is generally detectable in upper respiratory specimens during the acute phase of infection. The lowest concentration of SARS-CoV-2 viral copies this assay can detect is 138 copies/mL. A negative result does not preclude SARS-Cov-2 infection and should not be used as the sole basis for treatment or other patient management decisions. A negative result may occur with  improper specimen collection/handling, submission of specimen other than nasopharyngeal swab, presence of viral mutation(s) within the areas targeted by this assay, and inadequate number of viral copies(<138 copies/mL). A negative result must be combined with clinical observations, patient history, and epidemiological information. The expected result is Negative.  Fact Sheet for Patients:  EntrepreneurPulse.com.au  Fact Sheet for Healthcare Providers:   IncredibleEmployment.be  This test is no t yet approved or cleared by the [place] FDA and  has been authorized for detection and/or diagnosis of SARS-CoV-2 by FDA under an Emergency Use Authorization (EUA). This EUA will remain  in effect (meaning this test can be used) for the duration of the COVID-19 declaration under Section 564(b)(1) of the Act, 21 U.S.C.section 360bbb-3(b)(1), unless the authorization is terminated  or revoked sooner.  Influenza A by PCR NEGATIVE NEGATIVE Final   Influenza B by PCR NEGATIVE NEGATIVE Final    Comment: (NOTE) The Xpert Xpress SARS-CoV-2/FLU/RSV plus assay is intended as an aid in the diagnosis of influenza from Nasopharyngeal swab specimens and should not be used as a sole basis for treatment. Nasal washings and aspirates are unacceptable for Xpert Xpress SARS-CoV-2/FLU/RSV testing.  Fact Sheet for Patients: EntrepreneurPulse.com.au  Fact Sheet for Healthcare Providers: IncredibleEmployment.be  This test is not yet approved or cleared by the [place] FDA and has been authorized for detection and/or diagnosis of SARS-CoV-2 by FDA under an Emergency Use Authorization (EUA). This EUA will remain in effect (meaning this test can be used) for the duration of the COVID-19 declaration under Section 564(b)(1) of the Act, 21 U.S.C. section 360bbb-3(b)(1), unless the authorization is terminated or revoked.  Performed at [facility], [address], [place], [place] [address]   Urine Culture     Status: None   Collection Time: 08/21/21  7:59 PM   Specimen: Urine, Random  Result Value Ref Range Status   Specimen Description   Final    URINE, RANDOM Performed at [facility], [address], [place], [place] [address]    Special Requests   Final    NONE Performed at [facility], [address] [place], [place] [address]    Culture   Final     NO GROWTH Performed at [facility], [place] [address], [place], [place] [address]    Report Status 08/22/2021 FINAL  Final  Blood culture (routine x 2)     Status: None   Collection Time: 08/21/21 10:34 PM   Specimen: BLOOD  Result Value Ref Range Status   Specimen Description BLOOD LEFT ASSIST CONTROL  Final   Special Requests   Final    BOTTLES DRAWN AEROBIC AND ANAEROBIC Blood Culture adequate volume   Culture   Final    NO GROWTH 5 DAYS Performed at [facility], [address] [place], [place] [address]    Report Status 08/26/2021 FINAL  Final  Blood culture (routine x 2)     Status: None   Collection Time: 08/21/21 10:50 PM   Specimen: BLOOD  Result Value Ref Range Status   Specimen Description BLOOD RIGHT FOREARM  Final   Special Requests   Final    BOTTLES DRAWN AEROBIC AND ANAEROBIC Blood Culture results may not be optimal due to an inadequate volume of blood received in culture bottles   Culture   Final    NO GROWTH 5 DAYS Performed at [facility], [address] [place], [place] [address]    Report Status 08/26/2021 FINAL  Final    Labs: BNP (last 3 results) Recent Labs    08/21/21 1959  BNP 96.7   Basic Metabolic Panel: Recent Labs  Lab 08/27/21 0604 08/28/21 0434 08/29/21 0440 08/30/21 0957 08/31/21 0625  NA 133* 134* 134* 135 135  K 4.6 3.8 4.3 4.0 3.8  CL 101 97* 100 96* 100  CO2 29 33* 27 30 30   GLUCOSE 120* 126* 125* 193* 110*  BUN 18 20 19 17 14   CREATININE 0.98 0.98 0.85 0.95 0.79  CALCIUM 7.6* 7.9* 7.5* 7.9* 7.8*  MG 1.7  --  1.8 1.9 1.7  PHOS  --   --   --  2.8 2.6   Liver Function Tests: Recent Labs  Lab 08/30/21 0957 08/31/21 0625  AST 22 12*  ALT  13 12  ALKPHOS 49 46  BILITOT 0.5 0.6  PROT 5.4* 4.8*  ALBUMIN 2.3* 2.0*   No results for input(s): LIPASE, AMYLASE in the last 168 hours. No results for input(s): AMMONIA in the last 168 hours. CBC: Recent Labs  Lab 08/27/21 0604 08/28/21 0434  08/29/21 0440 08/30/21 0957 08/31/21 0625  WBC 15.5* 14.4* 19.4* 19.0* 16.1*  NEUTROABS 12.8* 11.9* 16.5* 16.2* 13.1*  HGB 10.3* 9.8* 9.5* 10.2* 9.7*  HCT 32.1* 29.7* 29.6* 32.5* 29.4*  MCV 107.4* 106.1* 105.3* 106.9* 106.1*  PLT 234 255 259 280 255   Cardiac Enzymes: No results for input(s): CKTOTAL, CKMB, CKMBINDEX, TROPONINI in the last 168 hours. BNP: Invalid input(s): POCBNP CBG: No results for input(s): GLUCAP in the last 168 hours. D-Dimer No results for input(s): DDIMER in the last 72 hours. Hgb A1c No results for input(s): HGBA1C in the last 72 hours. Lipid Profile No results for input(s): CHOL, HDL, LDLCALC, TRIG, CHOLHDL, LDLDIRECT in the last 72 hours. Thyroid function studies No results for input(s): TSH, T4TOTAL, T3FREE, THYROIDAB in the last 72 hours.  Invalid input(s): FREET3 Anemia work up No results for input(s): VITAMINB12, FOLATE, FERRITIN, TIBC, IRON, RETICCTPCT in the last 72 hours. Urinalysis    Component Value Date/Time   COLORURINE YELLOW (A) 08/21/2021 1959   APPEARANCEUR HAZY (A) 08/21/2021 1959   LABSPEC 1.016 08/21/2021 1959   PHURINE 5.0 08/21/2021 1959   GLUCOSEU NEGATIVE 08/21/2021 1959   GLUCOSEU NEGATIVE 07/27/2021 [place] 08/21/2021 1959   BILIRUBINUR NEGATIVE 08/21/2021 1959   BILIRUBINUR negative 07/25/2021 Index 08/21/2021 1959   PROTEINUR NEGATIVE 08/21/2021 1959   UROBILINOGEN 0.2 07/27/2021 1051   NITRITE NEGATIVE 08/21/2021 1959   LEUKOCYTESUR TRACE (A) 08/21/2021 1959   Sepsis Labs Invalid input(s): PROCALCITONIN,  WBC,  LACTICIDVEN Microbiology Recent Results (from the past 240 hour(s))  Resp Panel by RT-PCR (Flu A&B, Covid) Nasopharyngeal Swab     Status: None   Collection Time: 08/21/21  7:59 PM   Specimen: Nasopharyngeal Swab; Nasopharyngeal(NP) swabs in vial transport medium  Result Value Ref Range Status   SARS Coronavirus 2 by RT PCR NEGATIVE NEGATIVE Final    Comment:  (NOTE) SARS-CoV-2 target nucleic acids are NOT DETECTED.  The SARS-CoV-2 RNA is generally detectable in upper respiratory specimens during the acute phase of infection. The lowest concentration of SARS-CoV-2 viral copies this assay can detect is 138 copies/mL. A negative result does not preclude SARS-Cov-2 infection and should not be used as the sole basis for treatment or other patient management decisions. A negative result may occur with  improper specimen collection/handling, submission of specimen other than nasopharyngeal swab, presence of viral mutation(s) within the areas targeted by this assay, and inadequate number of viral copies(<138 copies/mL). A negative result must be combined with clinical observations, patient history, and epidemiological information. The expected result is Negative.  Fact Sheet for Patients:  EntrepreneurPulse.com.au  Fact Sheet for Healthcare Providers:  IncredibleEmployment.be  This test is no t yet approved or cleared by the [place] FDA and  has been authorized for detection and/or diagnosis of SARS-CoV-2 by FDA under an Emergency Use Authorization (EUA). This EUA will remain  in effect (meaning this test can be used) for the duration of the COVID-19 declaration under Section 564(b)(1) of the Act, 21 U.S.C.section 360bbb-3(b)(1), unless the authorization is terminated  or revoked sooner.       Influenza A by PCR NEGATIVE NEGATIVE Final   Influenza B  by PCR NEGATIVE NEGATIVE Final    Comment: (NOTE) The Xpert Xpress SARS-CoV-2/FLU/RSV plus assay is intended as an aid in the diagnosis of influenza from Nasopharyngeal swab specimens and should not be used as a sole basis for treatment. Nasal washings and aspirates are unacceptable for Xpert Xpress SARS-CoV-2/FLU/RSV testing.  Fact Sheet for Patients: EntrepreneurPulse.com.au  Fact Sheet for Healthcare  Providers: IncredibleEmployment.be  This test is not yet approved or cleared by the [place] FDA and has been authorized for detection and/or diagnosis of SARS-CoV-2 by FDA under an Emergency Use Authorization (EUA). This EUA will remain in effect (meaning this test can be used) for the duration of the COVID-19 declaration under Section 564(b)(1) of the Act, 21 U.S.C. section 360bbb-3(b)(1), unless the authorization is terminated or revoked.  Performed at [facility], [address], [place], [place] [address]   Urine Culture     Status: None   Collection Time: 08/21/21  7:59 PM   Specimen: Urine, Random  Result Value Ref Range Status   Specimen Description   Final    URINE, RANDOM Performed at [facility], [address], [place], [place] [address]    Special Requests   Final    NONE Performed at [facility], [address] [place], [place] [address]    Culture   Final    NO GROWTH Performed at [facility], [place] [address], [place], [place] [address]    Report Status 08/22/2021 FINAL  Final  Blood culture (routine x 2)     Status: None   Collection Time: 08/21/21 10:34 PM   Specimen: BLOOD  Result Value Ref Range Status   Specimen Description BLOOD LEFT ASSIST CONTROL  Final   Special Requests   Final    BOTTLES DRAWN AEROBIC AND ANAEROBIC Blood Culture adequate volume   Culture   Final    NO GROWTH 5 DAYS Performed at [facility], [address] [place], [place] [address]    Report Status 08/26/2021 FINAL  Final  Blood culture (routine x 2)     Status: None   Collection Time: 08/21/21 10:50 PM   Specimen: BLOOD  Result Value Ref Range Status   Specimen Description BLOOD RIGHT FOREARM  Final   Special Requests   Final    BOTTLES DRAWN AEROBIC AND ANAEROBIC Blood Culture results may not be optimal due to an inadequate volume of blood received in culture bottles   Culture   Final    NO GROWTH  5 DAYS Performed at [facility], [address] [place],  [address]    Report Status 08/26/2021 FINAL  Final   Time coordinating discharge: 35 minutes  SIGNED:  Kerney Elbe, DO [facility] 08/31/2021, 3:58 PM Pager is on [place]  If 7PM-7AM, please contact night-coverage [email or web address]

## 2021-08-31 NOTE — Progress Notes (Addendum)
Speech Language Pathology Treatment: Dysphagia  Patient Details Name: Jason Powers MRN: [id number] DOB: 02/04/1921 Today's Date: 08/31/2021 Time: 5643-3295 SLP Time Calculation (min) (ACUTE ONLY): 50 min  Assessment / Plan / Recommendation Clinical Impression  Pt seen for ongoing assessment of swallowing; education of general aspiration precautions. He is alert, verbally responsive and engaged in conversation w/ SLP; Dtr arrived during session. Pt is min HOH but adequate. Pt is on RA weaning from 2L; wbc wnl.  Pt explained general aspiration precautions and agreed verbally to the need for following them especially sitting upright for all oral intake -- supported behind the back w/ Pillows for more full upright sitting. Pt assisted w/ positioning d/t min weakness. He continued to feed himself lunch meal of purees and MINCED solids and thin liquids via cup/straw. NO overt clinical s/s of aspiration were noted w/ any consistency; respiratory status remained calm and unlabored, vocal quality clear b/t trials. Oral phase appeared grossly [facility] for bolus management and timely A-P transfer for swallowing; oral clearing achieved w/ all consistencies. Pt was able to mash/gum the MINCED foods adequately -- consumed Carrots and mashed potatoes then a full Magic Cup for lunch meal; along w/ thin liquids. NSG denied any deficits in swallowing. Educated Dtr and pt on general aspiration precautions; food consistencies (minced) and prep to moisten well for easier mashing/gumming; recommendations NOT to wear the Dentures if ill-fitting. Dtr agreed. Suggested Dtr use a food chopper or food processor to MINCED foods.   OF NOTE: Pt only ate what he wanted and then declined further -- he has not been eating much at home per report. Recommend Dietician f/u as needed. He is 87yo and grazing during the day vs 3 large meals would be recommended.    Pt appears at reduced risk for aspiration when following general aspiration  precautions; general REFLUX precautions. Recommend a MINCED, puree foods diet w/ gravies added to moisten foods; Thin liquids. Recommend general aspiration precautions; REFLUX precautions. Pills Whole in Puree; tray setup and positioning assistance for meals. ST services will sign off at this time w/ MD to reconsult if needed while admitted. NSG updated. Precautions posted at bedside.      HPI HPI: Pt is a 87 y.o. male with medical history significant for COPD, hypertension, iron deficiency anemia, BPH, history of thoracic compression fracture who presents with concerns of fall.  Patient reports that he was sitting on the toilet when he slid and fell.  Otherwise appears to have Dementia and unable to provide any further history.  CT of Head: negative. CTA of the chest showed no PE, showed a chronic compression fracture of T9 and T12 that is unchanged.   CT abdomen/proximal had findings suggestive of early acute diverticulitis without abscess.   Chest CTA: Prominent diffuse emphysematous changes throughout the  lungs. Scattered calcified granulomas. Fibrocalcific changes in the  lung apices particularly on the right consistent with  postinflammatory change. No airspace disease or consolidation. Mild  dependent atelectasis. No pleural effusions.      SLP Plan  All goals met      Recommendations for follow up therapy are one component of a multi-disciplinary discharge planning process, led by the attending physician.  Recommendations may be updated based on patient status, additional functional criteria and insurance authorization.    Recommendations  Diet recommendations: Dysphagia 2 (fine chop);Thin liquid (w/ added purees) Liquids provided via: Cup;Straw (monitor) Medication Administration: Whole meds with puree Supervision: Patient able to self feed;Intermittent supervision to cue for compensatory  strategies (setup) Compensations: Minimize environmental distractions;Slow rate;Small  sips/bites;Lingual sweep for clearance of pocketing;Follow solids with liquid Postural Changes and/or Swallow Maneuvers: Out of bed for meals;Seated upright 90 degrees;Upright 30-60 min after meal                General recommendations:  (Dietician f/u for support) Oral Care Recommendations: Oral care BID;Oral care before and after PO;Patient independent with oral care;Staff/trained caregiver to provide oral care (support) Follow up Recommendations: None SLP Visit Diagnosis: Dysphagia, unspecified (R13.10) (ill-fitting Dentures -- cannot wear) Plan: All goals met       GO                  Jason Kenner, MS, CCC-SLP Speech Language Pathologist Rehab Services [phone] [facility]  08/31/2021, 2:54 PM

## 2021-08-31 NOTE — Progress Notes (Signed)
Mobility Specialist - Progress Note   08/31/21 1600  Mobility  Range of Motion/Exercises Left leg;Right leg  Level of Assistance Standby assist, set-up cues, supervision of patient - no hands on  Assistive Device None  Distance Ambulated (ft) 0 ft  Mobility Response Tolerated well  Mobility performed by Mobility specialist  $Mobility charge 1 Mobility    Pt sleeping on arrival, awakened by voice. Pt participated in bed-level therex to promote strengthening. Alarm set   Kathee Delton Mobility Specialist 08/31/21, 4:37 PM

## 2021-08-31 NOTE — Progress Notes (Signed)
Physical Therapy Treatment Patient Details Name: Jason Powers MRN: [id number] DOB: 1920/12/06 Today's Date: 08/31/2021   History of Present Illness Pt is a 87 y.o. M w/ PMH significant for COPD, hypertension, iron deficiency anemia, dementia, BPH, history of thoracic compression fracture who presents with concerns of fall (per chart)    PT Comments    Pt was long sitting in bed upon arriving. He is A and O x 2 an agreeable to session. He reports feeling week but was motivated to improve. Pt was able to exit R side of bed with increased time and assistance. Stood 3 x EOB prior to walking ~ 5 ft to recliner. Pt is severely weak. Will benefit form SNF at DC to address deficits while assisting pt to PLOF. Rn aware of pt's abilities.    Recommendations for follow up therapy are one component of a multi-disciplinary discharge planning process, led by the attending physician.  Recommendations may be updated based on patient status, additional functional criteria and insurance authorization.  Follow Up Recommendations  SNF;Supervision/Assistance - 24 hour     Equipment Recommendations  Other (comment) (defer to next level of care)       Precautions / Restrictions Precautions Precautions: Fall Precaution Comments: weak Restrictions Weight Bearing Restrictions: No     Mobility  Bed Mobility Overal bed mobility: Needs Assistance Bed Mobility: Supine to Sit     Supine to sit: Mod assist;Max assist;HOB elevated          Transfers Overall transfer level: Needs assistance Equipment used: Rolling walker (2 wheeled) Transfers: Sit to/from Stand Sit to Stand: Mod assist;From elevated surface         General transfer comment: pt did perform STS 3 x during session with mod assist + vcs for improved technique and sequencing  Ambulation/Gait Ambulation/Gait assistance: Min assist Gait Distance (Feet): 5 Feet Assistive device: Rolling walker (2 wheeled) Gait Pattern/deviations:  Trunk flexed Gait velocity: decreased   General Gait Details: pt was able to ambulate ~ 5 ft from FOB to recliner placed 5 ft awaye. poor gait posture noted with extremely flexed posture    Balance Overall balance assessment: Needs assistance Sitting-balance support: Bilateral upper extremity supported;Feet supported Sitting balance-Leahy Scale: Fair     Standing balance support: Bilateral upper extremity supported Standing balance-Leahy Scale: Poor Standing balance comment: min A         Cognition Arousal/Alertness: Awake/alert Behavior During Therapy: WFL for tasks assessed/performed Overall Cognitive Status: History of cognitive impairments - at baseline      General Comments: Pt is A and O x 3 and agreeable to session. Consistently able to follow simple commands.             Pertinent Vitals/Pain Pain Assessment: 0-10 Pain Score: 2  Faces Pain Scale: Hurts a little bit Pain Location: all over body aches Pain Descriptors / Indicators: Aching;Guarding;Grimacing Pain Intervention(s): Limited activity within patient's tolerance;Monitored during session;Premedicated before session;Repositioned     PT Goals (current goals can now be found in the care plan section) Acute Rehab PT Goals Patient Stated Goal: Get stronger Progress towards PT goals: Progressing toward goals    Frequency    Min 2X/week      PT Plan Current plan remains appropriate       AM-PAC PT "6 Clicks" Mobility   Outcome Measure  Help needed turning from your back to your side while in a flat bed without using bedrails?: A Lot Help needed moving from lying on your back to  sitting on the side of a flat bed without using bedrails?: A Lot Help needed moving to and from a bed to a chair (including a wheelchair)?: A Lot Help needed standing up from a chair using your arms (e.g., wheelchair or bedside chair)?: A Lot Help needed to walk in hospital room?: A Lot Help needed climbing 3-5 steps with  a railing? : A Lot 6 Click Score: 12    End of Session Equipment Utilized During Treatment: Gait belt Activity Tolerance: Patient tolerated treatment well Patient left: in chair;with call bell/phone within reach;with chair alarm set;Other (comment) (RN aware) Nurse Communication: Mobility status PT Visit Diagnosis: Other abnormalities of gait and mobility (R26.89);Repeated falls (R29.6);History of falling (Z91.81)     Time: 1020-1047 PT Time Calculation (min) (ACUTE ONLY): 27 min  Charges:  $Therapeutic Activity: 23-37 mins                     Julaine Fusi PTA 08/31/21, 12:08 PM

## 2021-09-01 DIAGNOSIS — E612 Magnesium deficiency: Secondary | ICD-10-CM | POA: Diagnosis not present

## 2021-09-01 DIAGNOSIS — W19XXXD Unspecified fall, subsequent encounter: Secondary | ICD-10-CM | POA: Diagnosis not present

## 2021-09-01 DIAGNOSIS — S22000D Wedge compression fracture of unspecified thoracic vertebra, subsequent encounter for fracture with routine healing: Secondary | ICD-10-CM | POA: Diagnosis not present

## 2021-09-01 DIAGNOSIS — R0902 Hypoxemia: Secondary | ICD-10-CM | POA: Diagnosis not present

## 2021-09-01 DIAGNOSIS — E876 Hypokalemia: Secondary | ICD-10-CM | POA: Diagnosis not present

## 2021-09-01 DIAGNOSIS — R0602 Shortness of breath: Secondary | ICD-10-CM | POA: Diagnosis not present

## 2021-09-01 DIAGNOSIS — D539 Nutritional anemia, unspecified: Secondary | ICD-10-CM | POA: Diagnosis not present

## 2021-09-01 DIAGNOSIS — K5732 Diverticulitis of large intestine without perforation or abscess without bleeding: Secondary | ICD-10-CM | POA: Diagnosis not present

## 2021-09-01 DIAGNOSIS — R011 Cardiac murmur, unspecified: Secondary | ICD-10-CM | POA: Diagnosis not present

## 2021-09-01 DIAGNOSIS — I959 Hypotension, unspecified: Secondary | ICD-10-CM | POA: Diagnosis not present

## 2021-09-01 DIAGNOSIS — E43 Unspecified severe protein-calorie malnutrition: Secondary | ICD-10-CM | POA: Diagnosis not present

## 2021-09-01 DIAGNOSIS — R600 Localized edema: Secondary | ICD-10-CM | POA: Diagnosis not present

## 2021-09-01 DIAGNOSIS — K5792 Diverticulitis of intestine, part unspecified, without perforation or abscess without bleeding: Secondary | ICD-10-CM | POA: Diagnosis not present

## 2021-09-01 DIAGNOSIS — R6889 Other general symptoms and signs: Secondary | ICD-10-CM | POA: Diagnosis not present

## 2021-09-01 DIAGNOSIS — H409 Unspecified glaucoma: Secondary | ICD-10-CM | POA: Diagnosis not present

## 2021-09-01 DIAGNOSIS — Z23 Encounter for immunization: Secondary | ICD-10-CM | POA: Diagnosis not present

## 2021-09-01 DIAGNOSIS — H04123 Dry eye syndrome of bilateral lacrimal glands: Secondary | ICD-10-CM | POA: Diagnosis not present

## 2021-09-01 DIAGNOSIS — R799 Abnormal finding of blood chemistry, unspecified: Secondary | ICD-10-CM | POA: Diagnosis not present

## 2021-09-01 DIAGNOSIS — M4802 Spinal stenosis, cervical region: Secondary | ICD-10-CM | POA: Diagnosis not present

## 2021-09-01 DIAGNOSIS — A419 Sepsis, unspecified organism: Secondary | ICD-10-CM | POA: Diagnosis not present

## 2021-09-01 DIAGNOSIS — Z87891 Personal history of nicotine dependence: Secondary | ICD-10-CM | POA: Diagnosis not present

## 2021-09-01 DIAGNOSIS — Z7401 Bed confinement status: Secondary | ICD-10-CM | POA: Diagnosis not present

## 2021-09-01 DIAGNOSIS — J449 Chronic obstructive pulmonary disease, unspecified: Secondary | ICD-10-CM | POA: Diagnosis not present

## 2021-09-01 DIAGNOSIS — Z743 Need for continuous supervision: Secondary | ICD-10-CM | POA: Diagnosis not present

## 2021-09-01 DIAGNOSIS — R1312 Dysphagia, oropharyngeal phase: Secondary | ICD-10-CM | POA: Diagnosis not present

## 2021-09-01 DIAGNOSIS — R8290 Unspecified abnormal findings in urine: Secondary | ICD-10-CM | POA: Diagnosis not present

## 2021-09-01 DIAGNOSIS — L89151 Pressure ulcer of sacral region, stage 1: Secondary | ICD-10-CM | POA: Diagnosis not present

## 2021-09-01 NOTE — Discharge Summary (Signed)
Physician Discharge Summary  Jason Powers TFT:[id number] DOB: 03/18/1921 DOA: 08/21/2021  PCP: Crecencio Mc, MD  Admit date: 08/21/2021 Discharge date: 09/01/2021  Admitted From: Home Disposition: SNF  Recommendations for Outpatient Follow-up:  Follow up with PCP in 1-2 weeks Please obtain CMP/CBC, Mag, Phos in one week Please follow up on the following pending results:  Home Health: No  Equipment/Devices: None  Discharge Condition: Stable  CODE STATUS: FULL CODE  Diet recommendation:   Brief/Interim Summary: The patient is a 87 year old Caucasian male with a past medical history significant for but not limited to COPD, hypertension, iron deficiency anemia, BPH, history of thoracic compression fracture who presented with concerns of a fall.  Patient reported that he was sitting on the toilet when he stood and fell.  Otherwise he appears to have dementia and is unable to provide subjective history.  Imaging was negative with a head CT, cervical spine CT showed a C3 on C4 subluxation with possible ligamentous injury and MRI of the C-spine was done and showed no acute abnormality of the cervical spine but there is mild spinal canal stenosis at C3-C4 and severe left C4 neural foraminal stenosis. CT of the chest showed no PE but did show compression fracture of T9-T12 that was unchanged.  Findings were concerning for chronic outlet obstruction and CT of the abdomen pelvis showed early acute diverticulitis without abscess.  He was recently discharged from the hospital 08/11/2021 after treatment for acute sigmoid diverticulitis and acute on chronic anemia secondary to diverticular bleed that required transfusion of blood.  He was discharged on a 5-day course of Flagyl and then presented to the hospital after a fall at home.  Subsequently he was found to have sepsis secondary to acute sigmoid diverticulitis again and he was placed on IV Zosyn with improvement currently transition to p.o. Augmentin  he started worsening so he was changed back to IV ceftriaxone and Flagyl and is improved. He is stable to D/C SNF at this time and will need to follow up with PCP within 1-2 weeks.   ADDENDUM 09/01/21: Patient remains medically stable to D/C to SNF and is able to go this AM. Still very weak and understands he will need rehab. No overnight events and diarrhea is improved. No other concerns or complaints at this time and both daughters updated about the Care plan.   Discharge Diagnoses:  Principal Problem:   Sepsis ([place]) Active Problems:   Diverticulitis   Hypotension   Hypokalemia   Cervical stenosis of spinal canal   Protein-calorie malnutrition, severe  Sepsis secondary to acute sigmoid diverticulitis with associated leukocytosis, improving  -WBC was trending up and Dr. Mal Misty the case was discussed with Dr. Ola Spurr with concern for C. difficile -Dr. Ola Spurr recommended switching from Augmentin to IV ceftriaxone and Flagyl and will change to po Cipro/Flagyl at D/C -Of note the patient was discharged on 08/11/2021 after hospitalization for acute diverticulitis and diverticular bleed -WBC went from 14.4 and trended up to 19.4 now 19.0 -> 16.1 on last check  -Continue to monitor and trend and may need ID official assistance of continues to trend upward or reimaging -Patient states that he has intermittent diarrhea -Continue IV fluid hydration   Dehydration with hypotension and sinus tachycardia in the setting of sepsis -Blood pressure still on the lower side at 102/64 -Avoid antihypertensives and will discontinue continue IV fluid hydration   Hypokalemia -Patient has a intermittent diarrhea and improved  -potassium is now improved and stable at 3.8  Status post mechanical fall -Noted to have cervical spine stenosis and old T9 and T12 compression fractures -He denies any back pain -PT OT evaluating and recommending SNF   CKD stage IIIa -Chronic and better than baseline with no  evidence of AKI -Patient's BUNs/creatinine is now 17/0.95 -> 14/0.79 on last check  -Avoid further nephrotoxic medications, contrast dyes, hypotension renally dose medications -Continue monitor and trend and repeat CMP within 1 week    BPH with urinary retention -Has been able to pass urine spontaneously since his Foley catheter removed on 08/25/2021 -Continue Tamsulosin   Debility and generalized weakness with unsteady gait -PT OT recommending SNF   COPD -Stable and has no issues with breathing -Continue monitor respiratory status carefully   Iron deficiency anemia/macrocytic anemia -Patient's hemoglobin/hematocrit stable at 9.7/29.4 with an MCV of 106.1 -Check anemia panel in a.m. -Continue monitor for signs and symptoms bleeding; no overt bleeding noted -Repeat CBC within 1 week    Severe malnutrition in the context of chronic illness -Nutritionist consulted for further evaluation recommendations and recommending Ensure Enlive p.o. 3 times daily, Magic cup 3 times daily with meals, multivitamin with p.o. daily -Patient is at high risk for refeeding syndrome so we will continue monitor his potassium, magnesium and phosphorus daily until stable  Discharge Instructions  Discharge Instructions     Call MD for:  difficulty breathing, headache or visual disturbances   Complete by: As directed    Call MD for:  extreme fatigue   Complete by: As directed    Call MD for:  hives   Complete by: As directed    Call MD for:  persistant dizziness or light-headedness   Complete by: As directed    Call MD for:  persistant nausea and vomiting   Complete by: As directed    Call MD for:  redness, tenderness, or signs of infection (pain, swelling, redness, odor or green/yellow discharge around incision site)   Complete by: As directed    Call MD for:  severe uncontrolled pain   Complete by: As directed    Call MD for:  temperature >100.4   Complete by: As directed    Diet - low sodium  heart healthy   Complete by: As directed    Dysphagia 2 Diet with Thin Liquids   Discharge instructions   Complete by: As directed    You were cared for by a hospitalist during your hospital stay. If you have any questions about your discharge medications or the care you received while you were in the hospital after you are discharged, you can call the unit and ask to speak with the hospitalist on call if the hospitalist that took care of you is not available. Once you are discharged, your primary care physician will handle any further medical issues. Please note that NO REFILLS for any discharge medications will be authorized once you are discharged, as it is imperative that you return to your primary care physician (or establish a relationship with a primary care physician if you do not have one) for your aftercare needs so that they can reassess your need for medications and monitor your lab values.  Follow up with PCP and GI within 1-2 weeks. Take all medications as prescribed. If symptoms change or worsen please return to the ED for evaluation   Discharge wound care:   Complete by: As directed    Keep Clean and Dry   Increase activity slowly   Complete by: As directed  Allergies as of 08/31/2021   No Known Allergies      Medication List     STOP taking these medications    hydrochlorothiazide 25 MG tablet Commonly known as: HYDRODIURIL       TAKE these medications    Acetaminophen 325 MG Caps Take 2 capsules by mouth every 6 (six) hours as needed.   brinzolamide 1 % ophthalmic suspension Commonly known as: AZOPT Place 1 drop into both eyes 2 (two) times daily.   ciprofloxacin 500 MG tablet Commonly known as: Cipro Take 1 tablet (500 mg total) by mouth 2 (two) times daily for 3 days.   feeding supplement Liqd Take 237 mLs by mouth 3 (three) times daily between meals.   ferrous fumarate-iron polysaccharide complex 162-115.2 MG Caps capsule Commonly known as:  TANDEM Take 1 capsule by mouth daily with breakfast.   fluticasone 50 MCG/ACT nasal spray Commonly known as: FLONASE Place 2 sprays into both nostrils daily.   magnesium hydroxide 400 MG/5ML suspension Commonly known as: MILK OF MAGNESIA Take by mouth daily as needed for mild constipation.   metroNIDAZOLE 500 MG tablet Commonly known as: Flagyl Take 1 tablet (500 mg total) by mouth 3 (three) times daily for 3 days.   multivitamin with minerals Tabs tablet Take 1 tablet by mouth daily. Start taking on: September 01, 2021   saccharomyces boulardii 250 MG capsule Commonly known as: FLORASTOR Take 1 capsule (250 mg total) by mouth 2 (two) times daily.   sodium chloride 0.65 % Soln nasal spray Commonly known as: OCEAN Place 1 spray into both nostrils as needed for congestion.   tamsulosin 0.4 MG Caps capsule Commonly known as: FLOMAX Take 1 capsule (0.4 mg total) by mouth daily.               Discharge Care Instructions  (From admission, onward)           Start     Ordered   08/31/21 0000  Discharge wound care:       Comments: Keep Clean and Dry   08/31/21 1520           No Known Allergies  Consultations: Infectious disease was informally consulted by Dr. Rosanne Gutting   Procedures/Studies: Alexander (5MM)  Result Date: 08/21/2021 CLINICAL DATA:  Neck trauma after a fall. Struck head without loss of consciousness. Back pain and headache. EXAM: CT HEAD WITHOUT CONTRAST CT CERVICAL SPINE WITHOUT CONTRAST TECHNIQUE: Multidetector CT imaging of the head and cervical spine was performed following the standard protocol without intravenous contrast. Multiplanar CT image reconstructions of the cervical spine were also generated. COMPARISON:  None. FINDINGS: CT HEAD FINDINGS Brain: Diffuse cerebral atrophy. Ventricular dilatation consistent with central atrophy. Low-attenuation changes in the deep white matter consistent with small vessel ischemia. No abnormal  extra-axial fluid collections. No mass effect or midline shift. Gray-white matter junctions are distinct. Basal cisterns are not effaced. No acute intracranial hemorrhage. Vascular: Mild intracranial arterial vascular calcifications. Skull: Calvarium appears intact. Sinuses/Orbits: Paranasal sinuses and mastoid air cells are clear. Other: None. CT CERVICAL SPINE FINDINGS Alignment: 4 mm anterior subluxation of C3 on C4. Normal alignment of the facet joints. This could represent degenerative change but ligamentous injury may be present. Skull base and vertebrae: Skull base appears intact. No vertebral compression deformities. No focal bone lesion or bone destruction. Bone cortex appears intact. Soft tissues and spinal canal: No prevertebral soft tissue swelling. No abnormal paraspinal soft tissue mass or infiltration. Disc levels: Degenerative  changes throughout with narrowed disc spaces and endplate osteophyte formation. Degenerative changes in the facet joints. Uncovertebral spurring and facet joint hypertrophy cause encroachment upon the neural foramina bilaterally. Upper chest: Calcification and scarring in the lung apices, greater on the right. Emphysematous changes in the lung apices. Other: None. IMPRESSION: 1. No acute intracranial abnormalities. Chronic atrophy and small vessel ischemic changes. 2. Mild anterior subluxation of C3 on C4, possibly degenerative but ligamentous injury could be present. Consider MRI to assess for ligamentous injury. No acute fractures are identified. Prominent degenerative changes throughout the cervical spine. Electronically Signed   By: Lucienne Capers M.D.   On: 08/21/2021 22:03   CT Angio Chest PE W and/or Wo Contrast  Result Date: 08/21/2021 CLINICAL DATA:  Abdominal distention after a fall. Pulmonary embolus is suspected with high probability. EXAM: CT ANGIOGRAPHY CHEST CT ABDOMEN AND PELVIS WITH CONTRAST TECHNIQUE: Multidetector CT imaging of the chest was performed  using the standard protocol during bolus administration of intravenous contrast. Multiplanar CT image reconstructions and MIPs were obtained to evaluate the vascular anatomy. Multidetector CT imaging of the abdomen and pelvis was performed using the standard protocol during bolus administration of intravenous contrast. CONTRAST:  43mL OMNIPAQUE IOHEXOL 350 MG/ML SOLN COMPARISON:  CTA abdomen and pelvis 08/10/2021 FINDINGS: CTA CHEST FINDINGS Cardiovascular: Good opacification of the central and segmental pulmonary arteries. No focal filling defects. No evidence of significant pulmonary embolus. Normal heart size. No pericardial effusions. Normal caliber thoracic aorta. Scattered aortic calcification. No aortic dissection. Great vessel origins are patent. Mediastinum/Nodes: Thyroid gland is unremarkable. Esophagus is decompressed. No significant lymphadenopathy. Lungs/Pleura: Prominent diffuse emphysematous changes throughout the lungs. Scattered calcified granulomas. Fibrocalcific changes in the lung apices particularly on the right consistent with postinflammatory change. No airspace disease or consolidation. Mild dependent atelectasis. No pleural effusions. No pneumothorax. Musculoskeletal: Compression fractures at T12 and T9 vertebra. Lesions were present on prior thoracic spine radiograph from 06/13/2021 and prior MRI from 06/28/2021. Retropulsion of fracture fragments at T12 is again demonstrated, unchanged. Sternum and ribs are nondisplaced. Review of the MIP images confirms the above findings. CT ABDOMEN and PELVIS FINDINGS Hepatobiliary: No focal liver abnormality is seen. No gallstones, gallbladder wall thickening, or biliary dilatation. Pancreas: Unremarkable. No pancreatic ductal dilatation or surrounding inflammatory changes. Spleen: Normal in size without focal abnormality. Adrenals/Urinary Tract: No adrenal gland nodules. Mild renal parenchymal atrophy. Nephrograms are symmetrical. 2 mm stone in the  lower pole left kidney. No hydronephrosis or hydroureter. Diffuse bladder wall thickening with trabeculation and left posterior bladder wall diverticulum. Changes likely due to chronic outlet obstruction. Stomach/Bowel: Stomach, small bowel, and colon are not abnormally distended. Diverticulosis throughout the colon, most prominent in the sigmoid region. There is mild stranding around the sigmoid colon which may indicate early changes of acute diverticulitis. No abscess. Prominent stool-filled rectum with mild rectal wall thickening could indicate stercoral colitis. Appendix is not identified. Vascular/Lymphatic: Aortic atherosclerosis. No enlarged abdominal or pelvic lymph nodes. Reproductive: Diffuse prostate enlargement. Other: No free air or free fluid in the abdomen. Abdominal wall musculature appears intact. Musculoskeletal: Degenerative changes in the spine. No destructive bone lesions. Review of the MIP images confirms the above findings. IMPRESSION: 1. No evidence of significant pulmonary embolus. 2. Diffuse emphysematous changes in the lungs. Postinflammatory calcifications. No airspace disease. 3. Diffuse aortic atherosclerosis. 4. Nonobstructing stone in the lower pole left kidney. Bilateral renal parenchymal atrophy. 5. Bladder wall thickening and trabeculation with bladder wall diverticulum. Prostate enlargement. Findings likely represent chronic outlet obstruction.  6. Compression fractures of T9 and T12 vertebral bodies, unchanged since prior studies. 7. Diverticulosis of the sigmoid colon with mild stranding in the pericolonic fat possibly indicating early acute diverticulitis. No abscess. Electronically Signed   By: Lucienne Capers M.D.   On: 08/21/2021 22:18   CT Cervical Spine Wo Contrast  Result Date: 08/21/2021 CLINICAL DATA:  Neck trauma after a fall. Struck head without loss of consciousness. Back pain and headache. EXAM: CT HEAD WITHOUT CONTRAST CT CERVICAL SPINE WITHOUT CONTRAST  TECHNIQUE: Multidetector CT imaging of the head and cervical spine was performed following the standard protocol without intravenous contrast. Multiplanar CT image reconstructions of the cervical spine were also generated. COMPARISON:  None. FINDINGS: CT HEAD FINDINGS Brain: Diffuse cerebral atrophy. Ventricular dilatation consistent with central atrophy. Low-attenuation changes in the deep white matter consistent with small vessel ischemia. No abnormal extra-axial fluid collections. No mass effect or midline shift. Gray-white matter junctions are distinct. Basal cisterns are not effaced. No acute intracranial hemorrhage. Vascular: Mild intracranial arterial vascular calcifications. Skull: Calvarium appears intact. Sinuses/Orbits: Paranasal sinuses and mastoid air cells are clear. Other: None. CT CERVICAL SPINE FINDINGS Alignment: 4 mm anterior subluxation of C3 on C4. Normal alignment of the facet joints. This could represent degenerative change but ligamentous injury may be present. Skull base and vertebrae: Skull base appears intact. No vertebral compression deformities. No focal bone lesion or bone destruction. Bone cortex appears intact. Soft tissues and spinal canal: No prevertebral soft tissue swelling. No abnormal paraspinal soft tissue mass or infiltration. Disc levels: Degenerative changes throughout with narrowed disc spaces and endplate osteophyte formation. Degenerative changes in the facet joints. Uncovertebral spurring and facet joint hypertrophy cause encroachment upon the neural foramina bilaterally. Upper chest: Calcification and scarring in the lung apices, greater on the right. Emphysematous changes in the lung apices. Other: None. IMPRESSION: 1. No acute intracranial abnormalities. Chronic atrophy and small vessel ischemic changes. 2. Mild anterior subluxation of C3 on C4, possibly degenerative but ligamentous injury could be present. Consider MRI to assess for ligamentous injury. No acute  fractures are identified. Prominent degenerative changes throughout the cervical spine. Electronically Signed   By: Lucienne Capers M.D.   On: 08/21/2021 22:03   MR Cervical Spine Wo Contrast  Result Date: 08/22/2021 CLINICAL DATA:  Fall EXAM: MRI CERVICAL SPINE WITHOUT CONTRAST TECHNIQUE: Multiplanar, multisequence MR imaging of the cervical spine was performed. No intravenous contrast was administered. COMPARISON:  None. FINDINGS: Alignment: Grade 1 anterolisthesis at C3-4 Vertebrae: No fracture, evidence of discitis, or bone lesion. Cord: Normal signal and morphology. Posterior Fossa, vertebral arteries, paraspinal tissues: Negative. Disc levels: Axial images are severely degraded by motion, limiting detailed assessment of the disc spaces. Within that limitation, there is severe left C3-4 neural foraminal stenosis with mild spinal canal stenosis IMPRESSION: 1. Severely motion degraded examination. 2. No acute abnormality of the cervical spine. 3. Mild spinal canal stenosis at C3-4. Severe left C4 neural foraminal stenosis. Electronically Signed   By: Ulyses Jarred M.D.   On: 08/22/2021 01:47   CT ABDOMEN PELVIS W CONTRAST  Result Date: 08/21/2021 CLINICAL DATA:  Abdominal distention after a fall. Pulmonary embolus is suspected with high probability. EXAM: CT ANGIOGRAPHY CHEST CT ABDOMEN AND PELVIS WITH CONTRAST TECHNIQUE: Multidetector CT imaging of the chest was performed using the standard protocol during bolus administration of intravenous contrast. Multiplanar CT image reconstructions and MIPs were obtained to evaluate the vascular anatomy. Multidetector CT imaging of the abdomen and pelvis was performed using the  standard protocol during bolus administration of intravenous contrast. CONTRAST:  37mL OMNIPAQUE IOHEXOL 350 MG/ML SOLN COMPARISON:  CTA abdomen and pelvis 08/10/2021 FINDINGS: CTA CHEST FINDINGS Cardiovascular: Good opacification of the central and segmental pulmonary arteries. No focal  filling defects. No evidence of significant pulmonary embolus. Normal heart size. No pericardial effusions. Normal caliber thoracic aorta. Scattered aortic calcification. No aortic dissection. Great vessel origins are patent. Mediastinum/Nodes: Thyroid gland is unremarkable. Esophagus is decompressed. No significant lymphadenopathy. Lungs/Pleura: Prominent diffuse emphysematous changes throughout the lungs. Scattered calcified granulomas. Fibrocalcific changes in the lung apices particularly on the right consistent with postinflammatory change. No airspace disease or consolidation. Mild dependent atelectasis. No pleural effusions. No pneumothorax. Musculoskeletal: Compression fractures at T12 and T9 vertebra. Lesions were present on prior thoracic spine radiograph from 06/13/2021 and prior MRI from 06/28/2021. Retropulsion of fracture fragments at T12 is again demonstrated, unchanged. Sternum and ribs are nondisplaced. Review of the MIP images confirms the above findings. CT ABDOMEN and PELVIS FINDINGS Hepatobiliary: No focal liver abnormality is seen. No gallstones, gallbladder wall thickening, or biliary dilatation. Pancreas: Unremarkable. No pancreatic ductal dilatation or surrounding inflammatory changes. Spleen: Normal in size without focal abnormality. Adrenals/Urinary Tract: No adrenal gland nodules. Mild renal parenchymal atrophy. Nephrograms are symmetrical. 2 mm stone in the lower pole left kidney. No hydronephrosis or hydroureter. Diffuse bladder wall thickening with trabeculation and left posterior bladder wall diverticulum. Changes likely due to chronic outlet obstruction. Stomach/Bowel: Stomach, small bowel, and colon are not abnormally distended. Diverticulosis throughout the colon, most prominent in the sigmoid region. There is mild stranding around the sigmoid colon which may indicate early changes of acute diverticulitis. No abscess. Prominent stool-filled rectum with mild rectal wall thickening  could indicate stercoral colitis. Appendix is not identified. Vascular/Lymphatic: Aortic atherosclerosis. No enlarged abdominal or pelvic lymph nodes. Reproductive: Diffuse prostate enlargement. Other: No free air or free fluid in the abdomen. Abdominal wall musculature appears intact. Musculoskeletal: Degenerative changes in the spine. No destructive bone lesions. Review of the MIP images confirms the above findings. IMPRESSION: 1. No evidence of significant pulmonary embolus. 2. Diffuse emphysematous changes in the lungs. Postinflammatory calcifications. No airspace disease. 3. Diffuse aortic atherosclerosis. 4. Nonobstructing stone in the lower pole left kidney. Bilateral renal parenchymal atrophy. 5. Bladder wall thickening and trabeculation with bladder wall diverticulum. Prostate enlargement. Findings likely represent chronic outlet obstruction. 6. Compression fractures of T9 and T12 vertebral bodies, unchanged since prior studies. 7. Diverticulosis of the sigmoid colon with mild stranding in the pericolonic fat possibly indicating early acute diverticulitis. No abscess. Electronically Signed   By: Lucienne Capers M.D.   On: 08/21/2021 22:18   CT T-SPINE NO CHARGE  Result Date: 08/21/2021 CLINICAL DATA:  Fall EXAM: CT THORACIC SPINE WITHOUT CONTRAST TECHNIQUE: Multidetector CT images of the thoracic were obtained using the standard protocol without intravenous contrast. COMPARISON:  CT abdomen pelvis 08/10/2021 Thoracic spine radiographs 06/13/2021 FINDINGS: Alignment: Normal. Vertebrae: Compression fracture at T12 with greater than 50% height loss and 9 mm of retropulsion is unchanged. There is also an old compression fracture of T9 with less than 25% height loss and no retropulsion. Paraspinal and other soft tissues: Emphysema and calcific atherosclerosis of the aorta. Disc levels: There is moderate spinal canal narrowing at the T12 level due to the retropulsed fragment. Otherwise, no spinal canal  stenosis. IMPRESSION: 1. Unchanged appearance of T12 compression fracture with greater than 50% height loss and 9 mm of retropulsion resulting in moderate spinal canal narrowing. 2. Old compression  fracture of T9 with less than 25% height loss and no retropulsion. Aortic Atherosclerosis (ICD10-I70.0). Electronically Signed   By: Ulyses Jarred M.D.   On: 08/21/2021 22:06   CT L-SPINE NO CHARGE  Result Date: 08/21/2021 CLINICAL DATA:  Fall EXAM: CT LUMBAR SPINE WITHOUT CONTRAST TECHNIQUE: Multidetector CT imaging of the lumbar spine was performed without intravenous contrast administration. Multiplanar CT image reconstructions were also generated. COMPARISON:  None. FINDINGS: Segmentation: 5 lumbar type vertebrae. Alignment: Normal. Vertebrae: No acute fracture or focal pathologic process. Paraspinal and other soft tissues: Calcific aortic atherosclerosis Disc levels: Multilevel degenerative disc disease and facet arthrosis. No spinal canal stenosis. No neural impingement. IMPRESSION: 1. No acute fracture or static subluxation of the lumbar spine. 2. Multilevel degenerative disc disease and facet arthrosis without spinal canal stenosis or neural impingement. 3. T12 compression fracture described on concomitant thoracic spine CT. Aortic Atherosclerosis (ICD10-I70.0). Electronically Signed   By: Ulyses Jarred M.D.   On: 08/21/2021 22:14   CT ANGIO GI BLEED  Result Date: 08/10/2021 CLINICAL DATA:  Generalized abdominal pain and diarrhea. Dark red stool. Concern for GI bleed. Mesenteric ischemia, acute EXAM: CTA ABDOMEN AND PELVIS WITHOUT AND WITH CONTRAST TECHNIQUE: Multidetector CT imaging of the abdomen and pelvis was performed using the standard protocol during bolus administration of intravenous contrast. Multiplanar reconstructed images and MIPs were obtained and reviewed to evaluate the vascular anatomy. CONTRAST:  28mL OMNIPAQUE IOHEXOL 350 MG/ML SOLN COMPARISON:  Lumbar spine x-ray 06/13/2021. FINDINGS:  VASCULAR Aorta: Moderate calcified and noncalcified atherosclerotic plaque throughout the abdominal aorta without aneurysm or evidence of significant stenosis. Celiac: Atherosclerosis of the celiac trunk and its branches. Patent without evidence of aneurysm, dissection, vasculitis or significant stenosis. SMA: Mild atherosclerosis. Patent without evidence of aneurysm, dissection, vasculitis or significant stenosis. Renals: Atherosclerosis at the bilateral renal artery ostia. Both renal arteries are patent without evidence of aneurysm, dissection, vasculitis, fibromuscular dysplasia or significant stenosis. IMA: Patent. Inflow: Moderate atherosclerotic plaque. Patent without evidence of aneurysm, dissection, vasculitis or significant stenosis. Proximal Outflow: Moderate atherosclerotic plaque. Bilateral common femoral and visualized portions of the superficial and profunda femoral arteries are patent without evidence of aneurysm, dissection, vasculitis or significant stenosis. Veins: Major venous structures are patent. Review of the MIP images confirms the above findings. NON-VASCULAR Lower chest: Emphysematous changes within the visualized lung bases. Heart size is normal. Relative hypoattenuation of the cardiac blood pool indicative of anemia. Hepatobiliary: No focal liver abnormality is seen. No gallstones, gallbladder wall thickening, or biliary dilatation. Pancreas: Unremarkable. No pancreatic ductal dilatation or surrounding inflammatory changes. Spleen: Normal in size without focal abnormality. Adrenals/Urinary Tract: Unremarkable adrenal glands. Kidneys are mildly atrophic. Small punctate calcification in the left renal pelvis, likely vascular calcification. No definite renal calculi. No hydronephrosis. Urinary bladder wall is mildly thickened. There is a diverticulum emanating laterally at the left side of the bladder wall. Stomach/Bowel: Stomach is within normal limits. No dilated loops of bowel to suggest  obstruction. There is a focally inflamed diverticula within the mid sigmoid colon with adjacent fat stranding (series 6, image 61. No pericolonic fluid collection or abscess. No extraluminal air. Numerous additional diverticula throughout the colon. Appendix is not visualized, surgically absent by history. Multiphasic images demonstrate no evidence of active GI bleed. No contrast is seen pooling within bowel. Lymphatic: No abdominopelvic lymphadenopathy. Reproductive: Markedly enlarged, heterogeneous prostate gland resulting in significant mass effect upon the base of the urinary bladder. Prostate gland measures approximately 6.0 x 5.1 x 5.8 cm. Other: No free  fluid. No abdominopelvic fluid collection. No pneumoperitoneum. Small fat containing inguinal hernias. Musculoskeletal: Severe compression fracture of the T12 vertebral body with approximately 8 mm of retropulsion of the superior endplate likely resulting in at least moderate canal stenosis at this level (series 13, image 91). IMPRESSION: VASCULAR 1. No evidence of active GI bleed or large vessel occlusion. 2. Moderate atherosclerotic disease throughout the aortoiliac axis without aneurysm or dissection. (ICD10-I70.0) NON-VASCULAR 1. Acute uncomplicated sigmoid diverticulitis. 2. Severe compression fracture of the T12 vertebral body with approximately 8 mm of retropulsion. This likely results in at least moderate canal stenosis at this level. Degree of vertebral body height loss has progressed from 06/13/2021. 3. Markedly enlarged, heterogeneous prostate gland resulting in significant mass effect upon the base of the urinary bladder. 4. Mildly thickened urinary bladder wall is likely secondary to chronic outlet obstruction. Electronically Signed   By: Davina Poke D.O.   On: 08/10/2021 10:34    Subjective: Seen and examined at bedside and he is doing fairly well.  Denied complaints or no chest pain or shortness of breath.  No nausea or vomiting.  No  other concerns or complaints at this time but still feels very weak.  He is medically stable to be discharged to SNF today.  Discharge Exam: Vitals:   08/31/21 1153 08/31/21 1155  BP: (!) 96/44 (!) 99/58  Pulse: 88   Resp: 17   Temp: 98 F (36.7 C)   SpO2: 99%    Vitals:   08/31/21 0205 08/31/21 0755 08/31/21 1153 08/31/21 1155  BP: (!) 98/56 (!) 100/59 (!) 96/44 (!) 99/58  Pulse: 72 75 88   Resp: 18 17 17    Temp: 98.6 F (37 C) 98.4 F (36.9 C) 98 F (36.7 C)   TempSrc:  Oral Oral   SpO2: 99% 99% 99%   Weight:      Height:       General: Pt is alert, awake, not in acute distress Cardiovascular: RRR, S1/S2 +, no rubs, no gallops Respiratory: Diminished bilaterally, no wheezing, no rhonchi; unlabored breathing Abdominal: Soft, NT, ND, bowel sounds + Extremities: no edema, no cyanosis  The results of significant diagnostics from this hospitalization (including imaging, microbiology, ancillary and laboratory) are listed below for reference.    Microbiology: Recent Results (from the past 240 hour(s))  Resp Panel by RT-PCR (Flu A&B, Covid) Nasopharyngeal Swab     Status: None   Collection Time: 08/21/21  7:59 PM   Specimen: Nasopharyngeal Swab; Nasopharyngeal(NP) swabs in vial transport medium  Result Value Ref Range Status   SARS Coronavirus 2 by RT PCR NEGATIVE NEGATIVE Final    Comment: (NOTE) SARS-CoV-2 target nucleic acids are NOT DETECTED.  The SARS-CoV-2 RNA is generally detectable in upper respiratory specimens during the acute phase of infection. The lowest concentration of SARS-CoV-2 viral copies this assay can detect is 138 copies/mL. A negative result does not preclude SARS-Cov-2 infection and should not be used as the sole basis for treatment or other patient management decisions. A negative result may occur with  improper specimen collection/handling, submission of specimen other than nasopharyngeal swab, presence of viral mutation(s) within the areas  targeted by this assay, and inadequate number of viral copies(<138 copies/mL). A negative result must be combined with clinical observations, patient history, and epidemiological information. The expected result is Negative.  Fact Sheet for Patients:  EntrepreneurPulse.com.au  Fact Sheet for Healthcare Providers:  IncredibleEmployment.be  This test is no t yet approved or cleared by the [place]  FDA and  has been authorized for detection and/or diagnosis of SARS-CoV-2 by FDA under an Emergency Use Authorization (EUA). This EUA will remain  in effect (meaning this test can be used) for the duration of the COVID-19 declaration under Section 564(b)(1) of the Act, 21 U.S.C.section 360bbb-3(b)(1), unless the authorization is terminated  or revoked sooner.       Influenza A by PCR NEGATIVE NEGATIVE Final   Influenza B by PCR NEGATIVE NEGATIVE Final    Comment: (NOTE) The Xpert Xpress SARS-CoV-2/FLU/RSV plus assay is intended as an aid in the diagnosis of influenza from Nasopharyngeal swab specimens and should not be used as a sole basis for treatment. Nasal washings and aspirates are unacceptable for Xpert Xpress SARS-CoV-2/FLU/RSV testing.  Fact Sheet for Patients: EntrepreneurPulse.com.au  Fact Sheet for Healthcare Providers: IncredibleEmployment.be  This test is not yet approved or cleared by the [place] FDA and has been authorized for detection and/or diagnosis of SARS-CoV-2 by FDA under an Emergency Use Authorization (EUA). This EUA will remain in effect (meaning this test can be used) for the duration of the COVID-19 declaration under Section 564(b)(1) of the Act, 21 U.S.C. section 360bbb-3(b)(1), unless the authorization is terminated or revoked.  Performed at [facility], [address] [place], [place] [address]   Urine Culture     Status: None   Collection Time: 08/21/21   7:59 PM   Specimen: Urine, Random  Result Value Ref Range Status   Specimen Description   Final    URINE, RANDOM Performed at [facility], [address], [place], [place] [address]    Special Requests   Final    NONE Performed at [facility], [address], [place], [place] [address]    Culture   Final    NO GROWTH Performed at [facility], [place] [address] [place], [place] [address]    Report Status 08/22/2021 FINAL  Final  Blood culture (routine x 2)     Status: None   Collection Time: 08/21/21 10:34 PM   Specimen: BLOOD  Result Value Ref Range Status   Specimen Description BLOOD LEFT ASSIST CONTROL  Final   Special Requests   Final    BOTTLES DRAWN AEROBIC AND ANAEROBIC Blood Culture adequate volume   Culture   Final    NO GROWTH 5 DAYS Performed at [facility], [address] [place], [place] [address]    Report Status 08/26/2021 FINAL  Final  Blood culture (routine x 2)     Status: None   Collection Time: 08/21/21 10:50 PM   Specimen: BLOOD  Result Value Ref Range Status   Specimen Description BLOOD RIGHT FOREARM  Final   Special Requests   Final    BOTTLES DRAWN AEROBIC AND ANAEROBIC Blood Culture results may not be optimal due to an inadequate volume of blood received in culture bottles   Culture   Final    NO GROWTH 5 DAYS Performed at [facility], [address] [place], [place] [address]    Report Status 08/26/2021 FINAL  Final    Labs: BNP (last 3 results) Recent Labs    08/21/21 1959  BNP 41.2   Basic Metabolic Panel: Recent Labs  Lab 08/27/21 0604 08/28/21 0434 08/29/21 0440 08/30/21 0957 08/31/21 0625  NA 133* 134* 134* 135 135  K 4.6 3.8 4.3 4.0 3.8  CL 101 97* 100 96* 100  CO2 29 33* 27 30 30   GLUCOSE 120* 126* 125*  193* 110*  BUN 18 20 19 17 14   CREATININE 0.98 0.98 0.85 0.95 0.79  CALCIUM 7.6* 7.9* 7.5* 7.9* 7.8*  MG 1.7  --  1.8 1.9 1.7  PHOS  --   --   --  2.8 2.6   Liver  Function Tests: Recent Labs  Lab 08/30/21 0957 08/31/21 0625  AST 22 12*  ALT 13 12  ALKPHOS 49 46  BILITOT 0.5 0.6  PROT 5.4* 4.8*  ALBUMIN 2.3* 2.0*   No results for input(s): LIPASE, AMYLASE in the last 168 hours. No results for input(s): AMMONIA in the last 168 hours. CBC: Recent Labs  Lab 08/27/21 0604 08/28/21 0434 08/29/21 0440 08/30/21 0957 08/31/21 0625  WBC 15.5* 14.4* 19.4* 19.0* 16.1*  NEUTROABS 12.8* 11.9* 16.5* 16.2* 13.1*  HGB 10.3* 9.8* 9.5* 10.2* 9.7*  HCT 32.1* 29.7* 29.6* 32.5* 29.4*  MCV 107.4* 106.1* 105.3* 106.9* 106.1*  PLT 234 255 259 280 255   Cardiac Enzymes: No results for input(s): CKTOTAL, CKMB, CKMBINDEX, TROPONINI in the last 168 hours. BNP: Invalid input(s): POCBNP CBG: No results for input(s): GLUCAP in the last 168 hours. D-Dimer No results for input(s): DDIMER in the last 72 hours. Hgb A1c No results for input(s): HGBA1C in the last 72 hours. Lipid Profile No results for input(s): CHOL, HDL, LDLCALC, TRIG, CHOLHDL, LDLDIRECT in the last 72 hours. Thyroid function studies No results for input(s): TSH, T4TOTAL, T3FREE, THYROIDAB in the last 72 hours.  Invalid input(s): FREET3 Anemia work up No results for input(s): VITAMINB12, FOLATE, FERRITIN, TIBC, IRON, RETICCTPCT in the last 72 hours. Urinalysis    Component Value Date/Time   COLORURINE YELLOW (A) 08/21/2021 1959   APPEARANCEUR HAZY (A) 08/21/2021 1959   LABSPEC 1.016 08/21/2021 1959   PHURINE 5.0 08/21/2021 1959   GLUCOSEU NEGATIVE 08/21/2021 1959   GLUCOSEU NEGATIVE 07/27/2021 [place] 08/21/2021 1959   BILIRUBINUR NEGATIVE 08/21/2021 1959   BILIRUBINUR negative 07/25/2021 [place] 08/21/2021 1959   PROTEINUR NEGATIVE 08/21/2021 1959   UROBILINOGEN 0.2 07/27/2021 1051   NITRITE NEGATIVE 08/21/2021 1959   LEUKOCYTESUR TRACE (A) 08/21/2021 1959   Sepsis Labs Invalid input(s): PROCALCITONIN,  WBC,  LACTICIDVEN Microbiology Recent  Results (from the past 240 hour(s))  Resp Panel by RT-PCR (Flu A&B, Covid) Nasopharyngeal Swab     Status: None   Collection Time: 08/21/21  7:59 PM   Specimen: Nasopharyngeal Swab; Nasopharyngeal(NP) swabs in vial transport medium  Result Value Ref Range Status   SARS Coronavirus 2 by RT PCR NEGATIVE NEGATIVE Final    Comment: (NOTE) SARS-CoV-2 target nucleic acids are NOT DETECTED.  The SARS-CoV-2 RNA is generally detectable in upper respiratory specimens during the acute phase of infection. The lowest concentration of SARS-CoV-2 viral copies this assay can detect is 138 copies/mL. A negative result does not preclude SARS-Cov-2 infection and should not be used as the sole basis for treatment or other patient management decisions. A negative result may occur with  improper specimen collection/handling, submission of specimen other than nasopharyngeal swab, presence of viral mutation(s) within the areas targeted by this assay, and inadequate number of viral copies(<138 copies/mL). A negative result must be combined with clinical observations, patient history, and epidemiological information. The expected result is Negative.  Fact Sheet for Patients:  EntrepreneurPulse.com.au  Fact Sheet for Healthcare Providers:  IncredibleEmployment.be  This test is no t yet approved or cleared by the [place] FDA and  has been authorized for detection and/or diagnosis of  SARS-CoV-2 by FDA under an Emergency Use Authorization (EUA). This EUA will remain  in effect (meaning this test can be used) for the duration of the COVID-19 declaration under Section 564(b)(1) of the Act, 21 U.S.C.section 360bbb-3(b)(1), unless the authorization is terminated  or revoked sooner.       Influenza A by PCR NEGATIVE NEGATIVE Final   Influenza B by PCR NEGATIVE NEGATIVE Final    Comment: (NOTE) The Xpert Xpress SARS-CoV-2/FLU/RSV plus assay is intended as an aid in the  diagnosis of influenza from Nasopharyngeal swab specimens and should not be used as a sole basis for treatment. Nasal washings and aspirates are unacceptable for Xpert Xpress SARS-CoV-2/FLU/RSV testing.  Fact Sheet for Patients: EntrepreneurPulse.com.au  Fact Sheet for Healthcare Providers: IncredibleEmployment.be  This test is not yet approved or cleared by the [place] FDA and has been authorized for detection and/or diagnosis of SARS-CoV-2 by FDA under an Emergency Use Authorization (EUA). This EUA will remain in effect (meaning this test can be used) for the duration of the COVID-19 declaration under Section 564(b)(1) of the Act, 21 U.S.C. section 360bbb-3(b)(1), unless the authorization is terminated or revoked.  Performed at [facility], [address] [place], [place] [address]   Urine Culture     Status: None   Collection Time: 08/21/21  7:59 PM   Specimen: Urine, Random  Result Value Ref Range Status   Specimen Description   Final    URINE, RANDOM Performed at [facility], [address], [place], [place] [address]    Special Requests   Final    NONE Performed at [facility], [address] [place], [place] [address]    Culture   Final    NO GROWTH Performed at [facility], [place] [address], [place], [place] [address]    Report Status 08/22/2021 FINAL  Final  Blood culture (routine x 2)     Status: None   Collection Time: 08/21/21 10:34 PM   Specimen: BLOOD  Result Value Ref Range Status   Specimen Description BLOOD LEFT ASSIST CONTROL  Final   Special Requests   Final    BOTTLES DRAWN AEROBIC AND ANAEROBIC Blood Culture adequate volume   Culture   Final    NO GROWTH 5 DAYS Performed at [facility], [address] [place], [place] [address]    Report Status 08/26/2021 FINAL  Final  Blood culture (routine x 2)     Status: None   Collection Time: 08/21/21 10:50 PM    Specimen: BLOOD  Result Value Ref Range Status   Specimen Description BLOOD RIGHT FOREARM  Final   Special Requests   Final    BOTTLES DRAWN AEROBIC AND ANAEROBIC Blood Culture results may not be optimal due to an inadequate volume of blood received in culture bottles   Culture   Final    NO GROWTH 5 DAYS Performed at [facility], [address], [place], [place] [address]    Report Status 08/26/2021 FINAL  Final   Time coordinating discharge: 35 minutes  SIGNED:  Kerney Elbe, DO [facility] 08/31/2021, 3:58 PM Pager is on [phone]  If 7PM-7AM, please contact night-coverage [email or web address]

## 2021-09-01 NOTE — Care Management Important Message (Signed)
Important Message  Patient Details  Name: Yakov Bergen MRN: [id number] Date of Birth: September 03, 1921   Medicare Important Message Given:  Yes     [name] 09/01/2021, 1:22 PM

## 2021-09-01 NOTE — Discharge Summary (Signed)
Physician Discharge Summary  Jason Powers PNT:[id number] DOB: [birth year] DOA: 08/21/2021  PCP: Crecencio Mc, MD  Admit date: 08/21/2021 Discharge date: 08/31/2021  Admitted From: Home Disposition: SNF  Recommendations for Outpatient Follow-up:  Follow up with PCP in 1-2 weeks Please obtain CMP/CBC, Mag, Phos in one week Please follow up on the following pending results:  Home Health: No  Equipment/Devices: None  Discharge Condition: Stable  CODE STATUS: FULL CODE  Diet recommendation:   Brief/Interim Summary: The patient is a 87 year old Caucasian male with a past medical history significant for but not limited to COPD, hypertension, iron deficiency anemia, BPH, history of thoracic compression fracture who presented with concerns of a fall.  Patient reported that he was sitting on the toilet when he stood and fell.  Otherwise he appears to have dementia and is unable to provide subjective history.  Imaging was negative with a head CT, cervical spine CT showed a C3 on C4 subluxation with possible ligamentous injury and MRI of the C-spine was done and showed no acute abnormality of the cervical spine but there is mild spinal canal stenosis at C3-C4 and severe left C4 neural foraminal stenosis. CT of the chest showed no PE but did show compression fracture of T9-T12 that was unchanged.  Findings were concerning for chronic outlet obstruction and CT of the abdomen pelvis showed early acute diverticulitis without abscess.  He was recently discharged from the hospital 08/11/2021 after treatment for acute sigmoid diverticulitis and acute on chronic anemia secondary to diverticular bleed that required transfusion of blood.  He was discharged on a 5-day course of Flagyl and then presented to the hospital after a fall at home.  Subsequently he was found to have sepsis secondary to acute sigmoid diverticulitis again and he was placed on IV Zosyn with improvement currently transition to p.o. Augmentin  he started worsening so he was changed back to IV ceftriaxone and Flagyl and is improved. He is stable to D/C SNF at this time and will need to follow up with PCP within 1-2 weeks.   ADDENDUM 09/01/21: Patient remains medically stable to D/C to SNF and is able to go this AM. Still very weak and understands he will need rehab. No overnight events and diarrhea is improved. No other concerns or complaints at this time and both daughters updated about the Care plan.   Discharge Diagnoses:  Principal Problem:   Sepsis ([place]) Active Problems:   Diverticulitis   Hypotension   Hypokalemia   Cervical stenosis of spinal canal   Protein-calorie malnutrition, severe  Sepsis secondary to acute sigmoid diverticulitis with associated leukocytosis, improving  -WBC was trending up and Dr. Mal Misty the case was discussed with Dr. Ola Spurr with concern for C. difficile -Dr. Ola Spurr recommended switching from Augmentin to IV ceftriaxone and Flagyl and will change to po Cipro/Flagyl at D/C -Of note the patient was discharged on 08/11/2021 after hospitalization for acute diverticulitis and diverticular bleed -WBC went from 14.4 and trended up to 19.4 now 19.0 -> 16.1 on last check  -Continue to monitor and trend and may need ID official assistance of continues to trend upward or reimaging -Patient states that he has intermittent diarrhea -Continue IV fluid hydration   Dehydration with hypotension and sinus tachycardia in the setting of sepsis -Blood pressure still on the lower side at 102/64 -Avoid antihypertensives and will discontinue continue IV fluid hydration   Hypokalemia -Patient has a intermittent diarrhea and improved  -potassium is now improved and stable at 3.8  Status post mechanical fall -Noted to have cervical spine stenosis and old T9 and T12 compression fractures -He denies any back pain -PT OT evaluating and recommending SNF   CKD stage IIIa -Chronic and better than baseline with no  evidence of AKI -Patient's BUNs/creatinine is now 17/0.95 -> 14/0.79 on last check  -Avoid further nephrotoxic medications, contrast dyes, hypotension renally dose medications -Continue monitor and trend and repeat CMP within 1 week    BPH with urinary retention -Has been able to pass urine spontaneously since his Foley catheter removed on 08/25/2021 -Continue Tamsulosin   Debility and generalized weakness with unsteady gait -PT OT recommending SNF   COPD -Stable and has no issues with breathing -Continue monitor respiratory status carefully   Iron deficiency anemia/macrocytic anemia -Patient's hemoglobin/hematocrit stable at 9.7/29.4 with an MCV of 106.1 -Check anemia panel in a.m. -Continue monitor for signs and symptoms bleeding; no overt bleeding noted -Repeat CBC within 1 week    Severe malnutrition in the context of chronic illness -Nutritionist consulted for further evaluation recommendations and recommending Ensure Enlive p.o. 3 times daily, Magic cup 3 times daily with meals, multivitamin with p.o. daily -Patient is at high risk for refeeding syndrome so we will continue monitor his potassium, magnesium and phosphorus daily until stable  Discharge Instructions  Discharge Instructions     Call MD for:  difficulty breathing, headache or visual disturbances   Complete by: As directed    Call MD for:  extreme fatigue   Complete by: As directed    Call MD for:  hives   Complete by: As directed    Call MD for:  persistant dizziness or light-headedness   Complete by: As directed    Call MD for:  persistant nausea and vomiting   Complete by: As directed    Call MD for:  redness, tenderness, or signs of infection (pain, swelling, redness, odor or green/yellow discharge around incision site)   Complete by: As directed    Call MD for:  severe uncontrolled pain   Complete by: As directed    Call MD for:  temperature >100.4   Complete by: As directed    Diet - low sodium  heart healthy   Complete by: As directed    Dysphagia 2 Diet with Thin Liquids   Discharge instructions   Complete by: As directed    You were cared for by a hospitalist during your hospital stay. If you have any questions about your discharge medications or the care you received while you were in the hospital after you are discharged, you can call the unit and ask to speak with the hospitalist on call if the hospitalist that took care of you is not available. Once you are discharged, your primary care physician will handle any further medical issues. Please note that NO REFILLS for any discharge medications will be authorized once you are discharged, as it is imperative that you return to your primary care physician (or establish a relationship with a primary care physician if you do not have one) for your aftercare needs so that they can reassess your need for medications and monitor your lab values.  Follow up with PCP and GI within 1-2 weeks. Take all medications as prescribed. If symptoms change or worsen please return to the ED for evaluation   Discharge wound care:   Complete by: As directed    Keep Clean and Dry   Increase activity slowly   Complete by: As directed  Allergies as of 08/31/2021   No Known Allergies      Medication List     STOP taking these medications    hydrochlorothiazide 25 MG tablet Commonly known as: HYDRODIURIL       TAKE these medications    Acetaminophen 325 MG Caps Take 2 capsules by mouth every 6 (six) hours as needed.   brinzolamide 1 % ophthalmic suspension Commonly known as: AZOPT Place 1 drop into both eyes 2 (two) times daily.   ciprofloxacin 500 MG tablet Commonly known as: Cipro Take 1 tablet (500 mg total) by mouth 2 (two) times daily for 3 days.   feeding supplement Liqd Take 237 mLs by mouth 3 (three) times daily between meals.   ferrous fumarate-iron polysaccharide complex 162-115.2 MG Caps capsule Commonly known as:  TANDEM Take 1 capsule by mouth daily with breakfast.   fluticasone 50 MCG/ACT nasal spray Commonly known as: FLONASE Place 2 sprays into both nostrils daily.   magnesium hydroxide 400 MG/5ML suspension Commonly known as: MILK OF MAGNESIA Take by mouth daily as needed for mild constipation.   metroNIDAZOLE 500 MG tablet Commonly known as: Flagyl Take 1 tablet (500 mg total) by mouth 3 (three) times daily for 3 days.   multivitamin with minerals Tabs tablet Take 1 tablet by mouth daily. Start taking on: September 01, 2021   saccharomyces boulardii 250 MG capsule Commonly known as: FLORASTOR Take 1 capsule (250 mg total) by mouth 2 (two) times daily.   sodium chloride 0.65 % Soln nasal spray Commonly known as: OCEAN Place 1 spray into both nostrils as needed for congestion.   tamsulosin 0.4 MG Caps capsule Commonly known as: FLOMAX Take 1 capsule (0.4 mg total) by mouth daily.               Discharge Care Instructions  (From admission, onward)           Start     Ordered   08/31/21 0000  Discharge wound care:       Comments: Keep Clean and Dry   08/31/21 1520           No Known Allergies  Consultations: Infectious disease was informally consulted by Dr. Rosanne Gutting   Procedures/Studies: [place] (5MM)  Result Date: 08/21/2021 CLINICAL DATA:  Neck trauma after a fall. Struck head without loss of consciousness. Back pain and headache. EXAM: CT HEAD WITHOUT CONTRAST CT CERVICAL SPINE WITHOUT CONTRAST TECHNIQUE: Multidetector CT imaging of the head and cervical spine was performed following the standard protocol without intravenous contrast. Multiplanar CT image reconstructions of the cervical spine were also generated. COMPARISON:  None. FINDINGS: CT HEAD FINDINGS Brain: Diffuse cerebral atrophy. Ventricular dilatation consistent with central atrophy. Low-attenuation changes in the deep white matter consistent with small vessel ischemia. No abnormal  extra-axial fluid collections. No mass effect or midline shift. Gray-white matter junctions are distinct. Basal cisterns are not effaced. No acute intracranial hemorrhage. Vascular: Mild intracranial arterial vascular calcifications. Skull: Calvarium appears intact. Sinuses/Orbits: Paranasal sinuses and mastoid air cells are clear. Other: None. CT CERVICAL SPINE FINDINGS Alignment: 4 mm anterior subluxation of C3 on C4. Normal alignment of the facet joints. This could represent degenerative change but ligamentous injury may be present. Skull base and vertebrae: Skull base appears intact. No vertebral compression deformities. No focal bone lesion or bone destruction. Bone cortex appears intact. Soft tissues and spinal canal: No prevertebral soft tissue swelling. No abnormal paraspinal soft tissue mass or infiltration. Disc levels: Degenerative  changes throughout with narrowed disc spaces and endplate osteophyte formation. Degenerative changes in the facet joints. Uncovertebral spurring and facet joint hypertrophy cause encroachment upon the neural foramina bilaterally. Upper chest: Calcification and scarring in the lung apices, greater on the right. Emphysematous changes in the lung apices. Other: None. IMPRESSION: 1. No acute intracranial abnormalities. Chronic atrophy and small vessel ischemic changes. 2. Mild anterior subluxation of C3 on C4, possibly degenerative but ligamentous injury could be present. Consider MRI to assess for ligamentous injury. No acute fractures are identified. Prominent degenerative changes throughout the cervical spine. Electronically Signed   By: Lucienne Capers M.D.   On: 08/21/2021 22:03   CT Angio Chest PE W and/or Wo Contrast  Result Date: 08/21/2021 CLINICAL DATA:  Abdominal distention after a fall. Pulmonary embolus is suspected with high probability. EXAM: CT ANGIOGRAPHY CHEST CT ABDOMEN AND PELVIS WITH CONTRAST TECHNIQUE: Multidetector CT imaging of the chest was performed  using the standard protocol during bolus administration of intravenous contrast. Multiplanar CT image reconstructions and MIPs were obtained to evaluate the vascular anatomy. Multidetector CT imaging of the abdomen and pelvis was performed using the standard protocol during bolus administration of intravenous contrast. CONTRAST:  65mL OMNIPAQUE IOHEXOL 350 MG/ML SOLN COMPARISON:  CTA abdomen and pelvis 08/10/2021 FINDINGS: CTA CHEST FINDINGS Cardiovascular: Good opacification of the central and segmental pulmonary arteries. No focal filling defects. No evidence of significant pulmonary embolus. Normal heart size. No pericardial effusions. Normal caliber thoracic aorta. Scattered aortic calcification. No aortic dissection. Great vessel origins are patent. Mediastinum/Nodes: Thyroid gland is unremarkable. Esophagus is decompressed. No significant lymphadenopathy. Lungs/Pleura: Prominent diffuse emphysematous changes throughout the lungs. Scattered calcified granulomas. Fibrocalcific changes in the lung apices particularly on the right consistent with postinflammatory change. No airspace disease or consolidation. Mild dependent atelectasis. No pleural effusions. No pneumothorax. Musculoskeletal: Compression fractures at T12 and T9 vertebra. Lesions were present on prior thoracic spine radiograph from 06/13/2021 and prior MRI from 06/28/2021. Retropulsion of fracture fragments at T12 is again demonstrated, unchanged. Sternum and ribs are nondisplaced. Review of the MIP images confirms the above findings. CT ABDOMEN and PELVIS FINDINGS Hepatobiliary: No focal liver abnormality is seen. No gallstones, gallbladder wall thickening, or biliary dilatation. Pancreas: Unremarkable. No pancreatic ductal dilatation or surrounding inflammatory changes. Spleen: Normal in size without focal abnormality. Adrenals/Urinary Tract: No adrenal gland nodules. Mild renal parenchymal atrophy. Nephrograms are symmetrical. 2 mm stone in the  lower pole left kidney. No hydronephrosis or hydroureter. Diffuse bladder wall thickening with trabeculation and left posterior bladder wall diverticulum. Changes likely due to chronic outlet obstruction. Stomach/Bowel: Stomach, small bowel, and colon are not abnormally distended. Diverticulosis throughout the colon, most prominent in the sigmoid region. There is mild stranding around the sigmoid colon which may indicate early changes of acute diverticulitis. No abscess. Prominent stool-filled rectum with mild rectal wall thickening could indicate stercoral colitis. Appendix is not identified. Vascular/Lymphatic: Aortic atherosclerosis. No enlarged abdominal or pelvic lymph nodes. Reproductive: Diffuse prostate enlargement. Other: No free air or free fluid in the abdomen. Abdominal wall musculature appears intact. Musculoskeletal: Degenerative changes in the spine. No destructive bone lesions. Review of the MIP images confirms the above findings. IMPRESSION: 1. No evidence of significant pulmonary embolus. 2. Diffuse emphysematous changes in the lungs. Postinflammatory calcifications. No airspace disease. 3. Diffuse aortic atherosclerosis. 4. Nonobstructing stone in the lower pole left kidney. Bilateral renal parenchymal atrophy. 5. Bladder wall thickening and trabeculation with bladder wall diverticulum. Prostate enlargement. Findings likely represent chronic outlet obstruction.  6. Compression fractures of T9 and T12 vertebral bodies, unchanged since prior studies. 7. Diverticulosis of the sigmoid colon with mild stranding in the pericolonic fat possibly indicating early acute diverticulitis. No abscess. Electronically Signed   By: Lucienne Capers M.D.   On: 08/21/2021 22:18   CT Cervical Spine Wo Contrast  Result Date: 08/21/2021 CLINICAL DATA:  Neck trauma after a fall. Struck head without loss of consciousness. Back pain and headache. EXAM: CT HEAD WITHOUT CONTRAST CT CERVICAL SPINE WITHOUT CONTRAST  TECHNIQUE: Multidetector CT imaging of the head and cervical spine was performed following the standard protocol without intravenous contrast. Multiplanar CT image reconstructions of the cervical spine were also generated. COMPARISON:  None. FINDINGS: CT HEAD FINDINGS Brain: Diffuse cerebral atrophy. Ventricular dilatation consistent with central atrophy. Low-attenuation changes in the deep white matter consistent with small vessel ischemia. No abnormal extra-axial fluid collections. No mass effect or midline shift. Gray-white matter junctions are distinct. Basal cisterns are not effaced. No acute intracranial hemorrhage. Vascular: Mild intracranial arterial vascular calcifications. Skull: Calvarium appears intact. Sinuses/Orbits: Paranasal sinuses and mastoid air cells are clear. Other: None. CT CERVICAL SPINE FINDINGS Alignment: 4 mm anterior subluxation of C3 on C4. Normal alignment of the facet joints. This could represent degenerative change but ligamentous injury may be present. Skull base and vertebrae: Skull base appears intact. No vertebral compression deformities. No focal bone lesion or bone destruction. Bone cortex appears intact. Soft tissues and spinal canal: No prevertebral soft tissue swelling. No abnormal paraspinal soft tissue mass or infiltration. Disc levels: Degenerative changes throughout with narrowed disc spaces and endplate osteophyte formation. Degenerative changes in the facet joints. Uncovertebral spurring and facet joint hypertrophy cause encroachment upon the neural foramina bilaterally. Upper chest: Calcification and scarring in the lung apices, greater on the right. Emphysematous changes in the lung apices. Other: None. IMPRESSION: 1. No acute intracranial abnormalities. Chronic atrophy and small vessel ischemic changes. 2. Mild anterior subluxation of C3 on C4, possibly degenerative but ligamentous injury could be present. Consider MRI to assess for ligamentous injury. No acute  fractures are identified. Prominent degenerative changes throughout the cervical spine. Electronically Signed   By: Lucienne Capers M.D.   On: 08/21/2021 22:03   MR Cervical Spine Wo Contrast  Result Date: 08/22/2021 CLINICAL DATA:  Fall EXAM: MRI CERVICAL SPINE WITHOUT CONTRAST TECHNIQUE: Multiplanar, multisequence MR imaging of the cervical spine was performed. No intravenous contrast was administered. COMPARISON:  None. FINDINGS: Alignment: Grade 1 anterolisthesis at C3-4 Vertebrae: No fracture, evidence of discitis, or bone lesion. Cord: Normal signal and morphology. Posterior Fossa, vertebral arteries, paraspinal tissues: Negative. Disc levels: Axial images are severely degraded by motion, limiting detailed assessment of the disc spaces. Within that limitation, there is severe left C3-4 neural foraminal stenosis with mild spinal canal stenosis IMPRESSION: 1. Severely motion degraded examination. 2. No acute abnormality of the cervical spine. 3. Mild spinal canal stenosis at C3-4. Severe left C4 neural foraminal stenosis. Electronically Signed   By: Ulyses Jarred M.D.   On: 08/22/2021 01:47   CT ABDOMEN PELVIS W CONTRAST  Result Date: 08/21/2021 CLINICAL DATA:  Abdominal distention after a fall. Pulmonary embolus is suspected with high probability. EXAM: CT ANGIOGRAPHY CHEST CT ABDOMEN AND PELVIS WITH CONTRAST TECHNIQUE: Multidetector CT imaging of the chest was performed using the standard protocol during bolus administration of intravenous contrast. Multiplanar CT image reconstructions and MIPs were obtained to evaluate the vascular anatomy. Multidetector CT imaging of the abdomen and pelvis was performed using the  standard protocol during bolus administration of intravenous contrast. CONTRAST:  50mL OMNIPAQUE IOHEXOL 350 MG/ML SOLN COMPARISON:  CTA abdomen and pelvis 08/10/2021 FINDINGS: CTA CHEST FINDINGS Cardiovascular: Good opacification of the central and segmental pulmonary arteries. No focal  filling defects. No evidence of significant pulmonary embolus. Normal heart size. No pericardial effusions. Normal caliber thoracic aorta. Scattered aortic calcification. No aortic dissection. Great vessel origins are patent. Mediastinum/Nodes: Thyroid gland is unremarkable. Esophagus is decompressed. No significant lymphadenopathy. Lungs/Pleura: Prominent diffuse emphysematous changes throughout the lungs. Scattered calcified granulomas. Fibrocalcific changes in the lung apices particularly on the right consistent with postinflammatory change. No airspace disease or consolidation. Mild dependent atelectasis. No pleural effusions. No pneumothorax. Musculoskeletal: Compression fractures at T12 and T9 vertebra. Lesions were present on prior thoracic spine radiograph from 06/13/2021 and prior MRI from 06/28/2021. Retropulsion of fracture fragments at T12 is again demonstrated, unchanged. Sternum and ribs are nondisplaced. Review of the MIP images confirms the above findings. CT ABDOMEN and PELVIS FINDINGS Hepatobiliary: No focal liver abnormality is seen. No gallstones, gallbladder wall thickening, or biliary dilatation. Pancreas: Unremarkable. No pancreatic ductal dilatation or surrounding inflammatory changes. Spleen: Normal in size without focal abnormality. Adrenals/Urinary Tract: No adrenal gland nodules. Mild renal parenchymal atrophy. Nephrograms are symmetrical. 2 mm stone in the lower pole left kidney. No hydronephrosis or hydroureter. Diffuse bladder wall thickening with trabeculation and left posterior bladder wall diverticulum. Changes likely due to chronic outlet obstruction. Stomach/Bowel: Stomach, small bowel, and colon are not abnormally distended. Diverticulosis throughout the colon, most prominent in the sigmoid region. There is mild stranding around the sigmoid colon which may indicate early changes of acute diverticulitis. No abscess. Prominent stool-filled rectum with mild rectal wall thickening  could indicate stercoral colitis. Appendix is not identified. Vascular/Lymphatic: Aortic atherosclerosis. No enlarged abdominal or pelvic lymph nodes. Reproductive: Diffuse prostate enlargement. Other: No free air or free fluid in the abdomen. Abdominal wall musculature appears intact. Musculoskeletal: Degenerative changes in the spine. No destructive bone lesions. Review of the MIP images confirms the above findings. IMPRESSION: 1. No evidence of significant pulmonary embolus. 2. Diffuse emphysematous changes in the lungs. Postinflammatory calcifications. No airspace disease. 3. Diffuse aortic atherosclerosis. 4. Nonobstructing stone in the lower pole left kidney. Bilateral renal parenchymal atrophy. 5. Bladder wall thickening and trabeculation with bladder wall diverticulum. Prostate enlargement. Findings likely represent chronic outlet obstruction. 6. Compression fractures of T9 and T12 vertebral bodies, unchanged since prior studies. 7. Diverticulosis of the sigmoid colon with mild stranding in the pericolonic fat possibly indicating early acute diverticulitis. No abscess. Electronically Signed   By: Lucienne Capers M.D.   On: 08/21/2021 22:18   CT T-SPINE NO CHARGE  Result Date: 08/21/2021 CLINICAL DATA:  Fall EXAM: CT THORACIC SPINE WITHOUT CONTRAST TECHNIQUE: Multidetector CT images of the thoracic were obtained using the standard protocol without intravenous contrast. COMPARISON:  CT abdomen pelvis 08/10/2021 Thoracic spine radiographs 06/13/2021 FINDINGS: Alignment: Normal. Vertebrae: Compression fracture at T12 with greater than 50% height loss and 9 mm of retropulsion is unchanged. There is also an old compression fracture of T9 with less than 25% height loss and no retropulsion. Paraspinal and other soft tissues: Emphysema and calcific atherosclerosis of the aorta. Disc levels: There is moderate spinal canal narrowing at the T12 level due to the retropulsed fragment. Otherwise, no spinal canal  stenosis. IMPRESSION: 1. Unchanged appearance of T12 compression fracture with greater than 50% height loss and 9 mm of retropulsion resulting in moderate spinal canal narrowing. 2. Old compression  fracture of T9 with less than 25% height loss and no retropulsion. Aortic Atherosclerosis (ICD10-I70.0). Electronically Signed   By: Ulyses Jarred M.D.   On: 08/21/2021 22:06   CT L-SPINE NO CHARGE  Result Date: 08/21/2021 CLINICAL DATA:  Fall EXAM: CT LUMBAR SPINE WITHOUT CONTRAST TECHNIQUE: Multidetector CT imaging of the lumbar spine was performed without intravenous contrast administration. Multiplanar CT image reconstructions were also generated. COMPARISON:  None. FINDINGS: Segmentation: 5 lumbar type vertebrae. Alignment: Normal. Vertebrae: No acute fracture or focal pathologic process. Paraspinal and other soft tissues: Calcific aortic atherosclerosis Disc levels: Multilevel degenerative disc disease and facet arthrosis. No spinal canal stenosis. No neural impingement. IMPRESSION: 1. No acute fracture or static subluxation of the lumbar spine. 2. Multilevel degenerative disc disease and facet arthrosis without spinal canal stenosis or neural impingement. 3. T12 compression fracture described on concomitant thoracic spine CT. Aortic Atherosclerosis (ICD10-I70.0). Electronically Signed   By: Ulyses Jarred M.D.   On: 08/21/2021 22:14   CT ANGIO GI BLEED  Result Date: 08/10/2021 CLINICAL DATA:  Generalized abdominal pain and diarrhea. Dark red stool. Concern for GI bleed. Mesenteric ischemia, acute EXAM: CTA ABDOMEN AND PELVIS WITHOUT AND WITH CONTRAST TECHNIQUE: Multidetector CT imaging of the abdomen and pelvis was performed using the standard protocol during bolus administration of intravenous contrast. Multiplanar reconstructed images and MIPs were obtained and reviewed to evaluate the vascular anatomy. CONTRAST:  2mL OMNIPAQUE IOHEXOL 350 MG/ML SOLN COMPARISON:  Lumbar spine x-ray 06/13/2021. FINDINGS:  VASCULAR Aorta: Moderate calcified and noncalcified atherosclerotic plaque throughout the abdominal aorta without aneurysm or evidence of significant stenosis. Celiac: Atherosclerosis of the celiac trunk and its branches. Patent without evidence of aneurysm, dissection, vasculitis or significant stenosis. SMA: Mild atherosclerosis. Patent without evidence of aneurysm, dissection, vasculitis or significant stenosis. Renals: Atherosclerosis at the bilateral renal artery ostia. Both renal arteries are patent without evidence of aneurysm, dissection, vasculitis, fibromuscular dysplasia or significant stenosis. IMA: Patent. Inflow: Moderate atherosclerotic plaque. Patent without evidence of aneurysm, dissection, vasculitis or significant stenosis. Proximal Outflow: Moderate atherosclerotic plaque. Bilateral common femoral and visualized portions of the superficial and profunda femoral arteries are patent without evidence of aneurysm, dissection, vasculitis or significant stenosis. Veins: Major venous structures are patent. Review of the MIP images confirms the above findings. NON-VASCULAR Lower chest: Emphysematous changes within the visualized lung bases. Heart size is normal. Relative hypoattenuation of the cardiac blood pool indicative of anemia. Hepatobiliary: No focal liver abnormality is seen. No gallstones, gallbladder wall thickening, or biliary dilatation. Pancreas: Unremarkable. No pancreatic ductal dilatation or surrounding inflammatory changes. Spleen: Normal in size without focal abnormality. Adrenals/Urinary Tract: Unremarkable adrenal glands. Kidneys are mildly atrophic. Small punctate calcification in the left renal pelvis, likely vascular calcification. No definite renal calculi. No hydronephrosis. Urinary bladder wall is mildly thickened. There is a diverticulum emanating laterally at the left side of the bladder wall. Stomach/Bowel: Stomach is within normal limits. No dilated loops of bowel to suggest  obstruction. There is a focally inflamed diverticula within the mid sigmoid colon with adjacent fat stranding (series 6, image 61. No pericolonic fluid collection or abscess. No extraluminal air. Numerous additional diverticula throughout the colon. Appendix is not visualized, surgically absent by history. Multiphasic images demonstrate no evidence of active GI bleed. No contrast is seen pooling within bowel. Lymphatic: No abdominopelvic lymphadenopathy. Reproductive: Markedly enlarged, heterogeneous prostate gland resulting in significant mass effect upon the base of the urinary bladder. Prostate gland measures approximately 6.0 x 5.1 x 5.8 cm. Other: No free  fluid. No abdominopelvic fluid collection. No pneumoperitoneum. Small fat containing inguinal hernias. Musculoskeletal: Severe compression fracture of the T12 vertebral body with approximately 8 mm of retropulsion of the superior endplate likely resulting in at least moderate canal stenosis at this level (series 13, image 91). IMPRESSION: VASCULAR 1. No evidence of active GI bleed or large vessel occlusion. 2. Moderate atherosclerotic disease throughout the aortoiliac axis without aneurysm or dissection. (ICD10-I70.0) NON-VASCULAR 1. Acute uncomplicated sigmoid diverticulitis. 2. Severe compression fracture of the T12 vertebral body with approximately 8 mm of retropulsion. This likely results in at least moderate canal stenosis at this level. Degree of vertebral body height loss has progressed from 06/13/2021. 3. Markedly enlarged, heterogeneous prostate gland resulting in significant mass effect upon the base of the urinary bladder. 4. Mildly thickened urinary bladder wall is likely secondary to chronic outlet obstruction. Electronically Signed   By: Davina Poke D.O.   On: 08/10/2021 10:34    Subjective: Seen and examined at bedside and he is doing fairly well.  Denied complaints or no chest pain or shortness of breath.  No nausea or vomiting.  No  other concerns or complaints at this time but still feels very weak.  He is medically stable to be discharged to SNF today.  Discharge Exam: Vitals:   08/31/21 1153 08/31/21 1155  BP: (!) 96/44 (!) 99/58  Pulse: 88   Resp: 17   Temp: 98 F (36.7 C)   SpO2: 99%    Vitals:   08/31/21 0205 08/31/21 0755 08/31/21 1153 08/31/21 1155  BP: (!) 98/56 (!) 100/59 (!) 96/44 (!) 99/58  Pulse: 72 75 88   Resp: 18 17 17    Temp: 98.6 F (37 C) 98.4 F (36.9 C) 98 F (36.7 C)   TempSrc:  Oral Oral   SpO2: 99% 99% 99%   Weight:      Height:       General: Pt is alert, awake, not in acute distress Cardiovascular: RRR, S1/S2 +, no rubs, no gallops Respiratory: Diminished bilaterally, no wheezing, no rhonchi; unlabored breathing Abdominal: Soft, NT, ND, bowel sounds + Extremities: no edema, no cyanosis  The results of significant diagnostics from this hospitalization (including imaging, microbiology, ancillary and laboratory) are listed below for reference.    Microbiology: Recent Results (from the past 240 hour(s))  Resp Panel by RT-PCR (Flu A&B, Covid) Nasopharyngeal Swab     Status: None   Collection Time: 08/21/21  7:59 PM   Specimen: Nasopharyngeal Swab; Nasopharyngeal(NP) swabs in vial transport medium  Result Value Ref Range Status   SARS Coronavirus 2 by RT PCR NEGATIVE NEGATIVE Final    Comment: (NOTE) SARS-CoV-2 target nucleic acids are NOT DETECTED.  The SARS-CoV-2 RNA is generally detectable in upper respiratory specimens during the acute phase of infection. The lowest concentration of SARS-CoV-2 viral copies this assay can detect is 138 copies/mL. A negative result does not preclude SARS-Cov-2 infection and should not be used as the sole basis for treatment or other patient management decisions. A negative result may occur with  improper specimen collection/handling, submission of specimen other than nasopharyngeal swab, presence of viral mutation(s) within the areas  targeted by this assay, and inadequate number of viral copies(<138 copies/mL). A negative result must be combined with clinical observations, patient history, and epidemiological information. The expected result is Negative.  Fact Sheet for Patients:  EntrepreneurPulse.com.au  Fact Sheet for Healthcare Providers:  IncredibleEmployment.be  This test is no t yet approved or cleared by the [place]  FDA and  has been authorized for detection and/or diagnosis of SARS-CoV-2 by FDA under an Emergency Use Authorization (EUA). This EUA will remain  in effect (meaning this test can be used) for the duration of the COVID-19 declaration under Section 564(b)(1) of the Act, 21 U.S.C.section 360bbb-3(b)(1), unless the authorization is terminated  or revoked sooner.       Influenza A by PCR NEGATIVE NEGATIVE Final   Influenza B by PCR NEGATIVE NEGATIVE Final    Comment: (NOTE) The Xpert Xpress SARS-CoV-2/FLU/RSV plus assay is intended as an aid in the diagnosis of influenza from Nasopharyngeal swab specimens and should not be used as a sole basis for treatment. Nasal washings and aspirates are unacceptable for Xpert Xpress SARS-CoV-2/FLU/RSV testing.  Fact Sheet for Patients: EntrepreneurPulse.com.au  Fact Sheet for Healthcare Providers: IncredibleEmployment.be  This test is not yet approved or cleared by the [place] FDA and has been authorized for detection and/or diagnosis of SARS-CoV-2 by FDA under an Emergency Use Authorization (EUA). This EUA will remain in effect (meaning this test can be used) for the duration of the COVID-19 declaration under Section 564(b)(1) of the Act, 21 U.S.C. section 360bbb-3(b)(1), unless the authorization is terminated or revoked.  Performed at [facility], [address] [place], [place] [address]   Urine Culture     Status: None   Collection Time: 08/21/21   7:59 PM   Specimen: Urine, Random  Result Value Ref Range Status   Specimen Description   Final    URINE, RANDOM Performed at [facility], [address] [place], [place] [address]    Special Requests   Final    NONE Performed at [facility], [address], [place], [place] [address]    Culture   Final    NO GROWTH Performed at [facility], [place] [address] [place], [place] [address]    Report Status 08/22/2021 FINAL  Final  Blood culture (routine x 2)     Status: None   Collection Time: 08/21/21 10:34 PM   Specimen: BLOOD  Result Value Ref Range Status   Specimen Description BLOOD LEFT ASSIST CONTROL  Final   Special Requests   Final    BOTTLES DRAWN AEROBIC AND ANAEROBIC Blood Culture adequate volume   Culture   Final    NO GROWTH 5 DAYS Performed at [facility], [address] [place], [place] [address]    Report Status 08/26/2021 FINAL  Final  Blood culture (routine x 2)     Status: None   Collection Time: 08/21/21 10:50 PM   Specimen: BLOOD  Result Value Ref Range Status   Specimen Description BLOOD RIGHT FOREARM  Final   Special Requests   Final    BOTTLES DRAWN AEROBIC AND ANAEROBIC Blood Culture results may not be optimal due to an inadequate volume of blood received in culture bottles   Culture   Final    NO GROWTH 5 DAYS Performed at [facility], [address] [place],  [address]    Report Status 08/26/2021 FINAL  Final    Labs: BNP (last 3 results) Recent Labs    08/21/21 1959  BNP 86.7   Basic Metabolic Panel: Recent Labs  Lab 08/27/21 0604 08/28/21 0434 08/29/21 0440 08/30/21 0957 08/31/21 0625  NA 133* 134* 134* 135 135  K 4.6 3.8 4.3 4.0 3.8  CL 101 97* 100 96* 100  CO2 29 33* 27 30 30   GLUCOSE 120* 126* 125*  193* 110*  BUN 18 20 19 17 14   CREATININE 0.98 0.98 0.85 0.95 0.79  CALCIUM 7.6* 7.9* 7.5* 7.9* 7.8*  MG 1.7  --  1.8 1.9 1.7  PHOS  --   --   --  2.8 2.6   Liver  Function Tests: Recent Labs  Lab 08/30/21 0957 08/31/21 0625  AST 22 12*  ALT 13 12  ALKPHOS 49 46  BILITOT 0.5 0.6  PROT 5.4* 4.8*  ALBUMIN 2.3* 2.0*   No results for input(s): LIPASE, AMYLASE in the last 168 hours. No results for input(s): AMMONIA in the last 168 hours. CBC: Recent Labs  Lab 08/27/21 0604 08/28/21 0434 08/29/21 0440 08/30/21 0957 08/31/21 0625  WBC 15.5* 14.4* 19.4* 19.0* 16.1*  NEUTROABS 12.8* 11.9* 16.5* 16.2* 13.1*  HGB 10.3* 9.8* 9.5* 10.2* 9.7*  HCT 32.1* 29.7* 29.6* 32.5* 29.4*  MCV 107.4* 106.1* 105.3* 106.9* 106.1*  PLT 234 255 259 280 255   Cardiac Enzymes: No results for input(s): CKTOTAL, CKMB, CKMBINDEX, TROPONINI in the last 168 hours. BNP: Invalid input(s): POCBNP CBG: No results for input(s): GLUCAP in the last 168 hours. D-Dimer No results for input(s): DDIMER in the last 72 hours. Hgb A1c No results for input(s): HGBA1C in the last 72 hours. Lipid Profile No results for input(s): CHOL, HDL, LDLCALC, TRIG, CHOLHDL, LDLDIRECT in the last 72 hours. Thyroid function studies No results for input(s): TSH, T4TOTAL, T3FREE, THYROIDAB in the last 72 hours.  Invalid input(s): FREET3 Anemia work up No results for input(s): VITAMINB12, FOLATE, FERRITIN, TIBC, IRON, RETICCTPCT in the last 72 hours. Urinalysis    Component Value Date/Time   COLORURINE YELLOW (A) 08/21/2021 1959   APPEARANCEUR HAZY (A) 08/21/2021 1959   LABSPEC 1.016 08/21/2021 1959   PHURINE 5.0 08/21/2021 1959   GLUCOSEU NEGATIVE 08/21/2021 1959   GLUCOSEU NEGATIVE 07/27/2021 [place] 08/21/2021 1959   BILIRUBINUR NEGATIVE 08/21/2021 1959   BILIRUBINUR negative 07/25/2021 [place] 08/21/2021 1959   PROTEINUR NEGATIVE 08/21/2021 1959   UROBILINOGEN 0.2 07/27/2021 1051   NITRITE NEGATIVE 08/21/2021 1959   LEUKOCYTESUR TRACE (A) 08/21/2021 1959   Sepsis Labs Invalid input(s): PROCALCITONIN,  WBC,  LACTICIDVEN Microbiology Recent  Results (from the past 240 hour(s))  Resp Panel by RT-PCR (Flu A&B, Covid) Nasopharyngeal Swab     Status: None   Collection Time: 08/21/21  7:59 PM   Specimen: Nasopharyngeal Swab; Nasopharyngeal(NP) swabs in vial transport medium  Result Value Ref Range Status   SARS Coronavirus 2 by RT PCR NEGATIVE NEGATIVE Final    Comment: (NOTE) SARS-CoV-2 target nucleic acids are NOT DETECTED.  The SARS-CoV-2 RNA is generally detectable in upper respiratory specimens during the acute phase of infection. The lowest concentration of SARS-CoV-2 viral copies this assay can detect is 138 copies/mL. A negative result does not preclude SARS-Cov-2 infection and should not be used as the sole basis for treatment or other patient management decisions. A negative result may occur with  improper specimen collection/handling, submission of specimen other than nasopharyngeal swab, presence of viral mutation(s) within the areas targeted by this assay, and inadequate number of viral copies(<138 copies/mL). A negative result must be combined with clinical observations, patient history, and epidemiological information. The expected result is Negative.  Fact Sheet for Patients:  EntrepreneurPulse.com.au  Fact Sheet for Healthcare Providers:  IncredibleEmployment.be  This test is no t yet approved or cleared by the [place] FDA and  has been authorized for detection and/or diagnosis of  SARS-CoV-2 by FDA under an Emergency Use Authorization (EUA). This EUA will remain  in effect (meaning this test can be used) for the duration of the COVID-19 declaration under Section 564(b)(1) of the Act, 21 U.S.C.section 360bbb-3(b)(1), unless the authorization is terminated  or revoked sooner.       Influenza A by PCR NEGATIVE NEGATIVE Final   Influenza B by PCR NEGATIVE NEGATIVE Final    Comment: (NOTE) The Xpert Xpress SARS-CoV-2/FLU/RSV plus assay is intended as an aid in the  diagnosis of influenza from Nasopharyngeal swab specimens and should not be used as a sole basis for treatment. Nasal washings and aspirates are unacceptable for Xpert Xpress SARS-CoV-2/FLU/RSV testing.  Fact Sheet for Patients: EntrepreneurPulse.com.au  Fact Sheet for Healthcare Providers: IncredibleEmployment.be  This test is not yet approved or cleared by the [place] FDA and has been authorized for detection and/or diagnosis of SARS-CoV-2 by FDA under an Emergency Use Authorization (EUA). This EUA will remain in effect (meaning this test can be used) for the duration of the COVID-19 declaration under Section 564(b)(1) of the Act, 21 U.S.C. section 360bbb-3(b)(1), unless the authorization is terminated or revoked.  Performed at [facility], [address], [place], [place] [address]   Urine Culture     Status: None   Collection Time: 08/21/21  7:59 PM   Specimen: Urine, Random  Result Value Ref Range Status   Specimen Description   Final    URINE, RANDOM Performed at [facility], [address], [place], [place] [address]    Special Requests   Final    NONE Performed at [facility], [address], [place], [place] [address]    Culture   Final    NO GROWTH Performed at [facility], [place] [address] [place], [place] [address]    Report Status 08/22/2021 FINAL  Final  Blood culture (routine x 2)     Status: None   Collection Time: 08/21/21 10:34 PM   Specimen: BLOOD  Result Value Ref Range Status   Specimen Description BLOOD LEFT ASSIST CONTROL  Final   Special Requests   Final    BOTTLES DRAWN AEROBIC AND ANAEROBIC Blood Culture adequate volume   Culture   Final    NO GROWTH 5 DAYS Performed at [facility], [address] [place], [place] [address]    Report Status 08/26/2021 FINAL  Final  Blood culture (routine x 2)     Status: None   Collection Time: 08/21/21 10:50 PM    Specimen: BLOOD  Result Value Ref Range Status   Specimen Description BLOOD RIGHT FOREARM  Final   Special Requests   Final    BOTTLES DRAWN AEROBIC AND ANAEROBIC Blood Culture results may not be optimal due to an inadequate volume of blood received in culture bottles   Culture   Final    NO GROWTH 5 DAYS Performed at [facility], [address] [place], [place] [address]    Report Status 08/26/2021 FINAL  Final   Time coordinating discharge: 35 minutes  SIGNED:  Kerney Elbe, DO [facility] 08/31/2021, 3:58 PM Pager is on [place]  If 7PM-7AM, please contact night-coverage [email or web address]

## 2021-09-01 NOTE — TOC Transition Note (Addendum)
Transition of Care [facility]) - CM/SW Discharge Note   Patient Details  Name: Jason Powers MRN: [id number] Date of Birth: 1921-08-27  [facility] CM/SW Contact:  Alberteen Sam, LCSW Phone Number: 09/01/2021, 12:30 PM   Clinical Narrative:     Patient will DC to: [facility] Anticipated DC date: 09/01/21 Family notified: [name] (daughter)  Transport [id number]  Per MD patient ready for DC to [facility] . RN, patient, patient's family, and facility notified of DC. Discharge Summary sent to facility. RN given number for report  [phone]. DC packet on chart. Ambulance transport requested for patient.  CSW signing off.  Pricilla Riffle, LCSW    Final next level of care: Skilled Nursing Facility Barriers to Discharge: No Barriers Identified   Patient Goals and CMS Choice Patient states their goals for this hospitalization and ongoing recovery are:: to go home CMS Medicare.gov Compare Post Acute Care list provided to:: Patient Represenative (must comment) Choice offered to / list presented to : Spouse  Discharge Placement              Patient [facility] bed at: [facility] Patient to be transferred to facility by: [facility] Name of family member notified: daughter [name] Patient and family notified of of transfer: 09/01/21  Discharge Plan and Services     Post Acute Care Choice:  (TBD)                               Social Determinants of Health (SDOH) Interventions     Readmission Risk Interventions No flowsheet data found.

## 2021-09-05 ENCOUNTER — Ambulatory Visit: Payer: Medicare Other | Admitting: Physical Therapy

## 2021-09-07 ENCOUNTER — Encounter: Payer: Medicare Other | Admitting: Physical Therapy

## 2021-09-08 DIAGNOSIS — E612 Magnesium deficiency: Secondary | ICD-10-CM | POA: Diagnosis not present

## 2021-09-08 DIAGNOSIS — R799 Abnormal finding of blood chemistry, unspecified: Secondary | ICD-10-CM | POA: Diagnosis not present

## 2021-09-11 ENCOUNTER — Encounter: Payer: Medicare Other | Admitting: Physical Therapy

## 2021-09-14 ENCOUNTER — Encounter: Payer: Medicare Other | Admitting: Physical Therapy

## 2021-09-18 ENCOUNTER — Encounter: Payer: Medicare Other | Admitting: Physical Therapy

## 2021-09-18 DIAGNOSIS — E43 Unspecified severe protein-calorie malnutrition: Secondary | ICD-10-CM | POA: Diagnosis not present

## 2021-09-18 DIAGNOSIS — K5792 Diverticulitis of intestine, part unspecified, without perforation or abscess without bleeding: Secondary | ICD-10-CM | POA: Diagnosis not present

## 2021-09-18 NOTE — Progress Notes (Signed)
 Jason Powers is a 87 y.o. 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 No specifics can be obtained from pt, he is worried about urination and a catheter as best I can tell  Severe protein-calorie malnutrition (CMS-HCC) Still very very frail appearing and seemingly poor po abilities    No fever chills sweats are noted, no nausea vomiting or diarrhea have been noted, no chest pain or sob either   No past medical history on file.  No past surgical history on file.    Social History   Socioeconomic History  . Marital status: Widowed  Tobacco Use  . Smoking status: Former    Types: Cigarettes    Quit date: 1964    Years since quitting: 58.8  . Smokeless tobacco: Never     Medication reviewed and signed on the nursing facility chart as appropriate  view all Weight: 128.2 Lbs 09/15/2021 19:40  [place] (Manual)  -5.0% change [ Comparison Weight 09/01/2021, 137.4 Lbs, -6.7% , -9.2 Lbs ]; view all Blood Pressure: 101 /  57 mmHg 09/18/2021 07:27   AnNixon (Manual)  Diastolic Low of 60 exceeded view all Temperature: 98.0 F 09/18/2021 07:27   AnNixon (Manual)  view all Pulse: 94 bpm 09/18/2021 07:27   AnNixon (Manual)  view all Respirations: 18 Breaths/min 09/18/2021 07:27   AnNixon (Manual)  view all Blood Sugar:        view all O2 Saturation: 92.0 % 09/15/2021 23:29   lray (Manual)  view all Height: 67.0 Inches 09/16/2021 13:29   jgiordano (Manual)  view all Pain Level: 0 09/17/2021 22:58  [place] (Manual)  No acute distress, temporal wasting and clavicular prominence noted  No icterus  No JVD Lungs; clear to ascultation Heart; Regular rate and rhythm  Abdomen; Soft and flat, normal bowel sounds Extremities; No clubbing,  cyanosis or edema  Available labs reviewed in the nursing home chart.   ASSESSMENT  AND PLAN: Diagnoses and all orders for this visit:  Diverticulitis Assessment & Plan: Family and staff note generally worsening status. 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 This patient is a complicated patient requiring skilled nursing care and multiple medications and multiple medical problems which need and will be monitored. Decompensation remains a risk and the medication regimen will be reordered as needed and adjusted. I have reordered all the medications as is required. Aggressive care is not indicated given illnesses and advanced age. Difficult situation and generally poor prognosis as noted on the first noted. Will check labs and bladder status, restart po antibiotics as certainly could have reccurrent/persistent diverticulitis

## 2021-09-21 ENCOUNTER — Encounter: Payer: Medicare Other | Admitting: Physical Therapy

## 2021-09-22 ENCOUNTER — Ambulatory Visit: Payer: Medicare Other | Admitting: Internal Medicine

## 2021-09-25 ENCOUNTER — Encounter: Payer: Medicare Other | Admitting: Physical Therapy

## 2021-09-28 ENCOUNTER — Encounter: Payer: Medicare Other | Admitting: Physical Therapy

## 2021-10-02 ENCOUNTER — Encounter: Payer: Medicare Other | Admitting: Physical Therapy

## 2021-10-09 ENCOUNTER — Encounter: Payer: Medicare Other | Admitting: Physical Therapy

## 2021-10-11 ENCOUNTER — Ambulatory Visit: Payer: Medicare Other | Admitting: Podiatry

## 2021-10-12 ENCOUNTER — Encounter: Payer: Medicare Other | Admitting: Physical Therapy

## 2021-10-12 DEATH — deceased

## 2021-10-16 ENCOUNTER — Encounter: Payer: Medicare Other | Admitting: Physical Therapy

## 2021-10-19 ENCOUNTER — Encounter: Payer: Medicare Other | Admitting: Physical Therapy

## 2021-10-23 ENCOUNTER — Encounter: Payer: Medicare Other | Admitting: Physical Therapy

## 2021-10-26 ENCOUNTER — Encounter: Payer: Medicare Other | Admitting: Physical Therapy

## 2021-10-30 ENCOUNTER — Encounter: Payer: Medicare Other | Admitting: Physical Therapy

## 2022-01-30 ENCOUNTER — Ambulatory Visit: Payer: Medicare Other | Admitting: Oncology

## 2022-01-30 ENCOUNTER — Other Ambulatory Visit: Payer: Medicare Other

## 2022-05-05 IMAGING — MR MR THORACIC SPINE W/O CM
6 series · 29 of 48 positions shown · non-contrast
Comparison: 06/13/2021

CLINICAL DATA: Follow-up compression fracture

EXAM:
MRI THORACIC SPINE WITHOUT CONTRAST
TECHNIQUE: Multiplanar, multisequence MR imaging of the thoracic spine was
performed. No intravenous contrast was administered.

[Series 18: T1 · sagittal · 6.0mm · 1.41mm/px · 2 of 9 slices shown (1 of 2)]
[im 1/9]
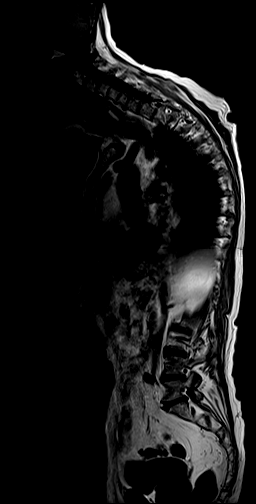
[im 9/9]
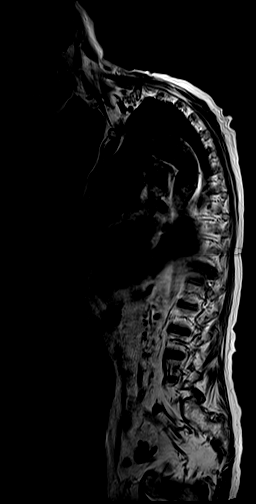

[Series 19: T2 · sagittal · 3.0mm · 1.06mm/px · 6 of 17 slices shown (1 of 2)]
[im 1/17]
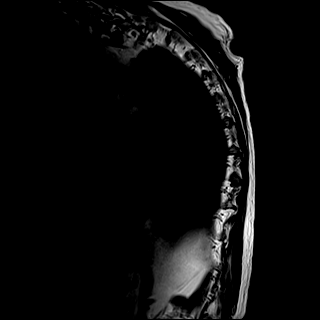
[im 4/17]
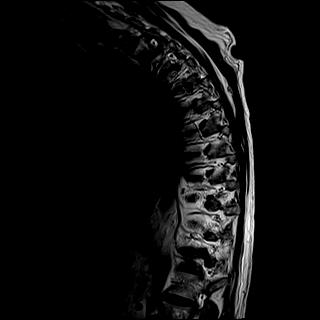
[im 7/17]
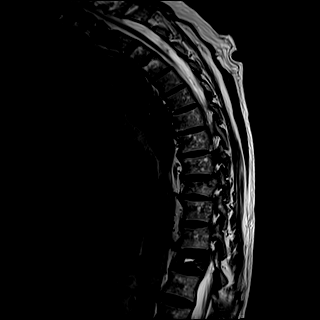
[im 10/17]
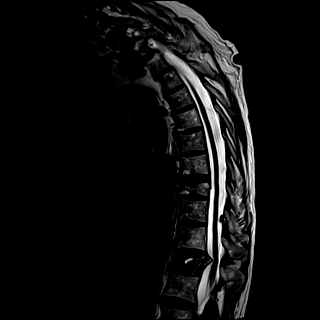
[im 13/17]
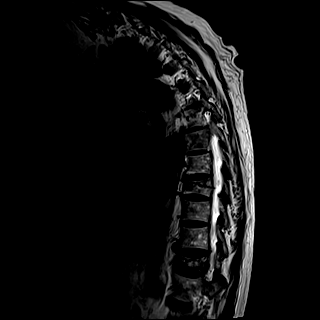
[im 17/17]
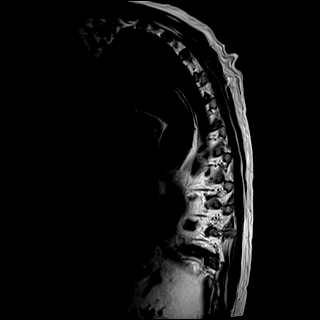

[Series 20: T1 · sagittal · 3.0mm · 1.06mm/px · 6 of 17 slices shown (2 of 2)]
[im 1/17]
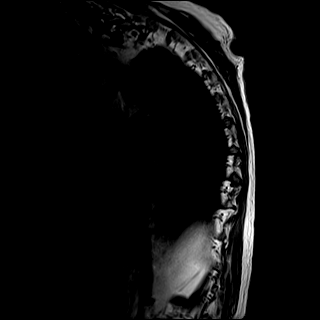
[im 4/17]
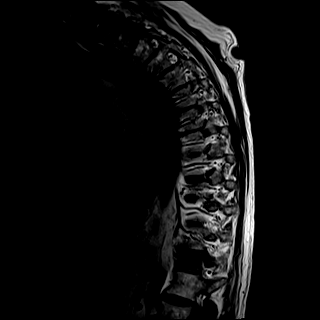
[im 7/17]
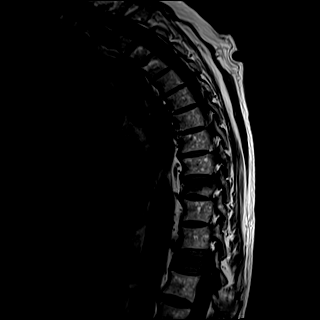
[im 10/17]
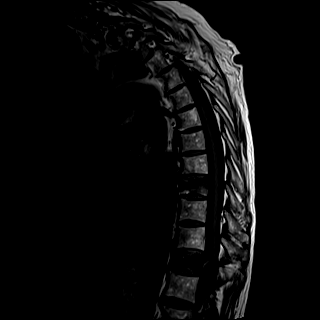
[im 13/17]
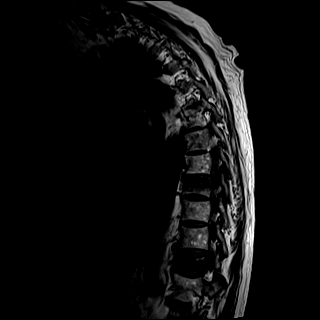
[im 17/17]
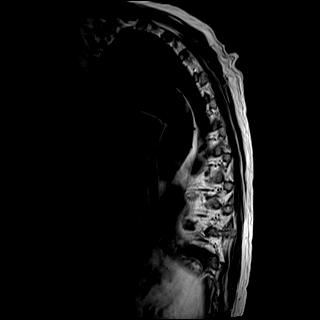

[Series 21: STIR · sagittal · 3.0mm · 0.53mm/px · 6 of 17 slices shown]
[im 1/17]
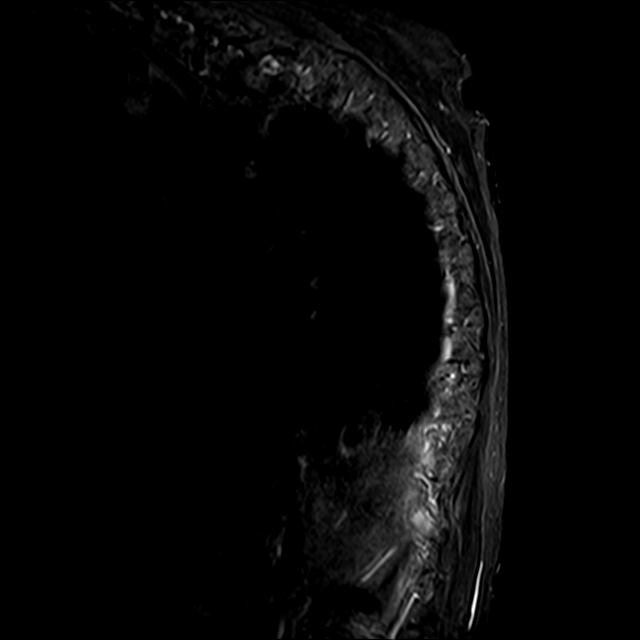
[im 4/17]
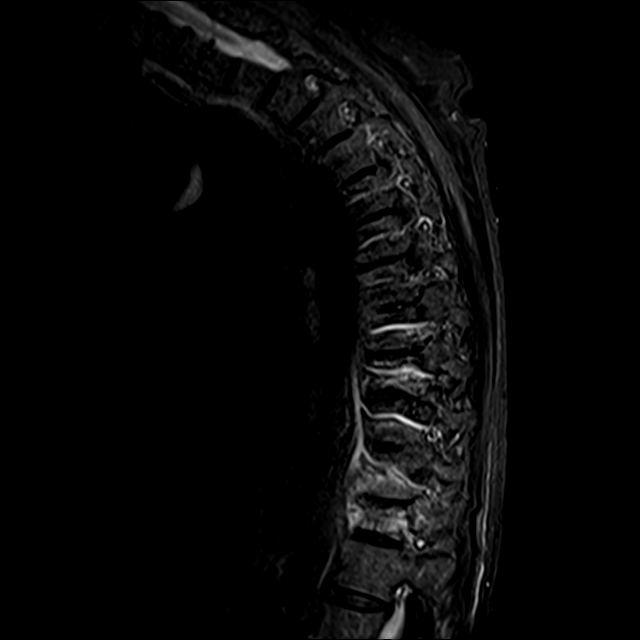
[im 7/17]
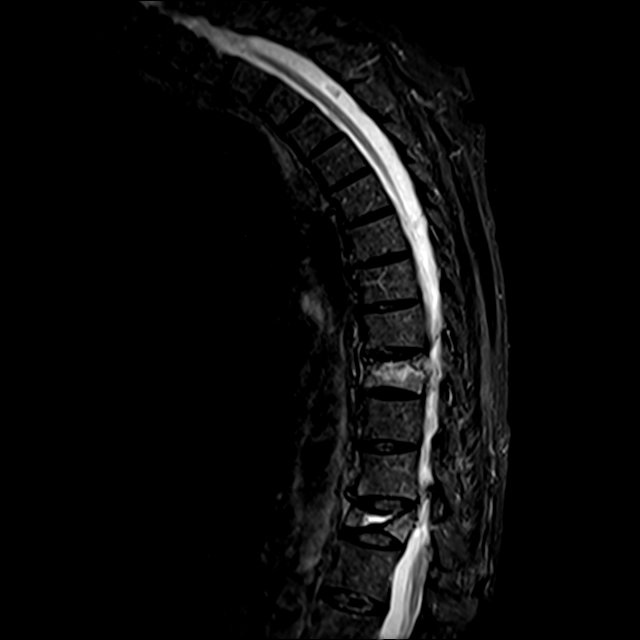
[im 10/17]
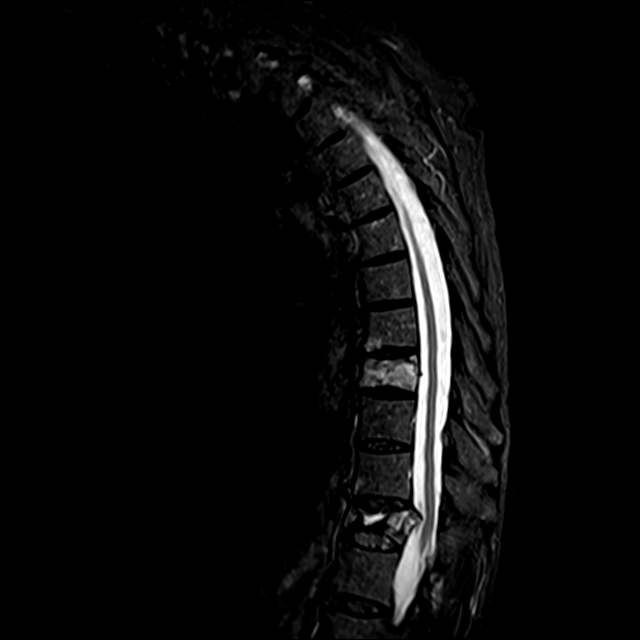
[im 13/17]
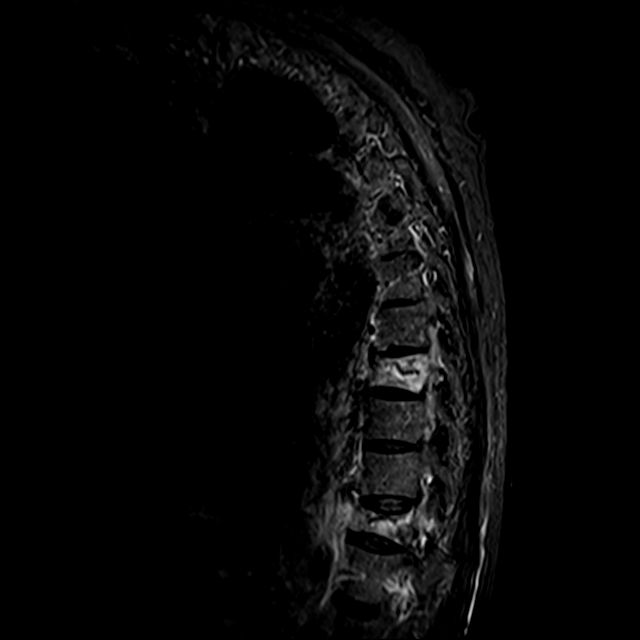
[im 17/17]
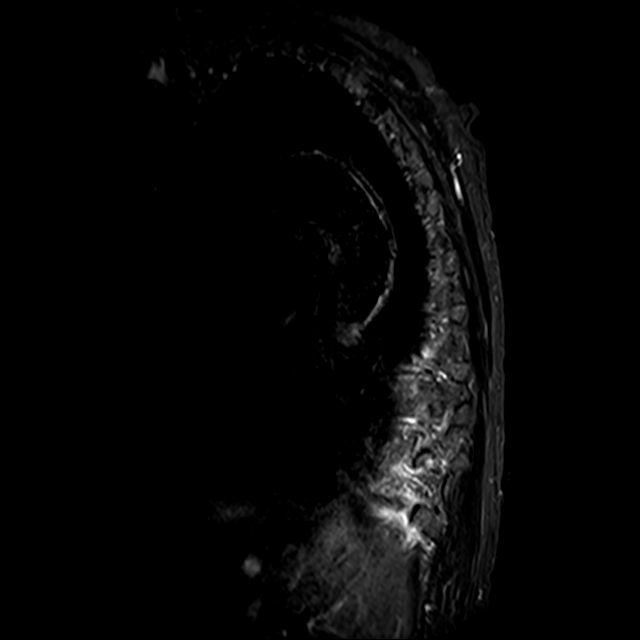

[Series 22: T2 · axial · 4.0mm · 0.59mm/px · z∈[-236,-26]mm · 8 of 40 slices shown (2 of 2)]
[im 1/40]
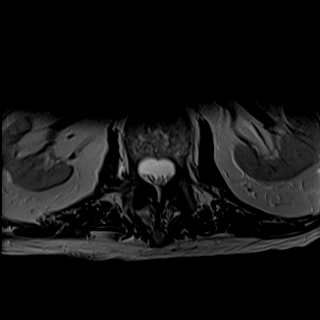
[im 7/40]
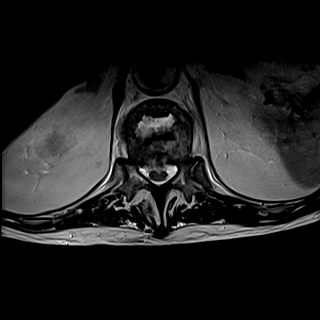
[im 13/40]
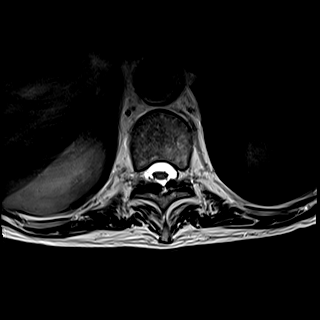
[im 19/40]
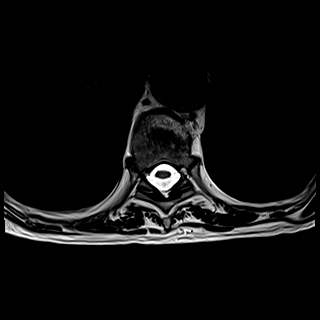
[im 22/40]
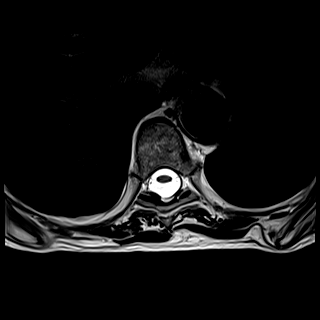
[im 28/40]
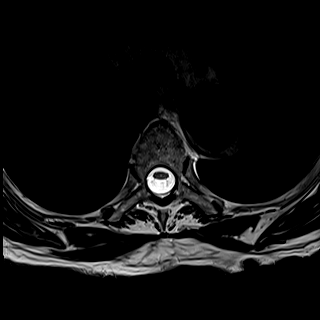
[im 34/40]
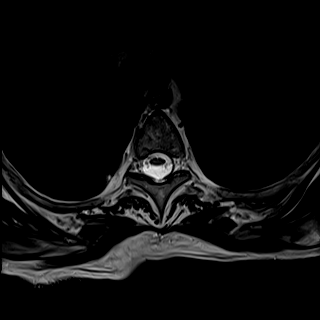
[im 40/40]
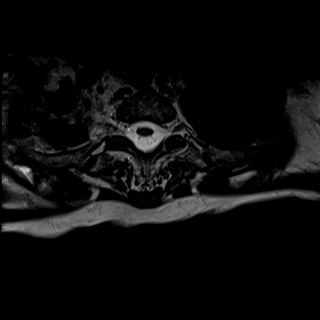

[Series 23: GRE · axial · 4.0mm · 0.37mm/px · 1 of 40 slices shown]
[im 1/40]
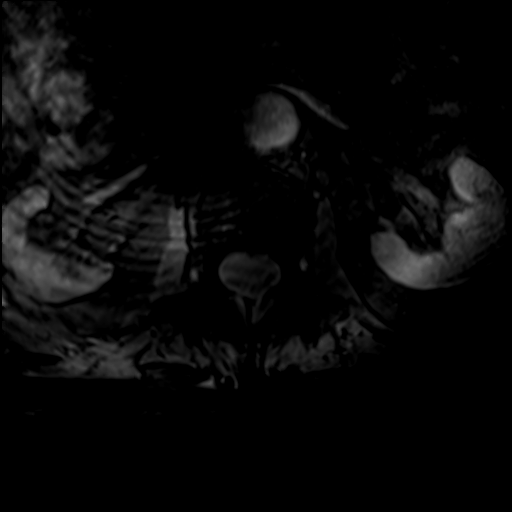

[29 of 48 positions shown; findings below may reference images not displayed]

FINDINGS: Alignment:  Exaggerated thoracic kyphosis.

Vertebrae: T9 compression fracture with marrow edema and a
horizontal fracture plane closest to the superior endplate. Height
loss compared to T8 measures 30% at maximum. No retropulsion or
ligamentous disruption.

T12 compression fracture with marrow edema and fluid containing
fracture cleft. Height loss measures 50%. There is retropulsion up
to 6 mm which posteriorly displaces and indents the ventral cord. No
cord edema. Marrow edema extends into the bilateral pedicle. No
ligamentous disruption.

The fractures have no associated perivertebral edema, likely
subacute.

No metastatic pattern.  Remote T3 superior endplate fracture.

Cord:  No cord edema.

Paraspinal and other soft tissues: Thick walled bladder with
diverticulum above an enlarged prostate. Colonic diverticulosis.

Disc levels:

Diffuse, expected disc desiccation and narrowing. Generalized facet
spurring. No degenerative cord or foraminal impingement in the
thoracic spine.

There is notable cervical spine degeneration with C3-4
anterolisthesis and marked but noncompressive retro dental
ligamentous thickening.
IMPRESSION: Subacute T12 compression fracture with 50% height loss and 6 mm of
retropulsion that indents the ventral cord.

Subacute T9 compression fracture with mild height loss and no
retropulsion.
